# Patient Record
Sex: Male | Born: 1980 | Race: White | Hispanic: No | Smoking: Current some day smoker
Health system: Southern US, Community
[De-identification: ages and names within clinical notes are randomized; demographics above are authoritative.]

## PROBLEM LIST (undated history)

## (undated) DIAGNOSIS — F419 Anxiety disorder, unspecified: Secondary | ICD-10-CM

## (undated) DIAGNOSIS — F1010 Alcohol abuse, uncomplicated: Secondary | ICD-10-CM

## (undated) DIAGNOSIS — R569 Unspecified convulsions: Secondary | ICD-10-CM

## (undated) DIAGNOSIS — F32A Depression, unspecified: Secondary | ICD-10-CM

## (undated) DIAGNOSIS — F4310 Post-traumatic stress disorder, unspecified: Secondary | ICD-10-CM

## (undated) DIAGNOSIS — F329 Major depressive disorder, single episode, unspecified: Secondary | ICD-10-CM

## (undated) HISTORY — PX: WISDOM TOOTH EXTRACTION: SHX21

---

## 2014-01-06 ENCOUNTER — Encounter (HOSPITAL_COMMUNITY): Payer: Self-pay | Admitting: Emergency Medicine

## 2014-01-06 ENCOUNTER — Emergency Department (HOSPITAL_COMMUNITY)
Admission: EM | Admit: 2014-01-06 | Discharge: 2014-01-08 | Disposition: A | Discharge status: Other Acute Inpatient Hospital | Payer: Self-pay | Attending: Emergency Medicine | Admitting: Emergency Medicine

## 2014-01-06 DIAGNOSIS — F1010 Alcohol abuse, uncomplicated: Secondary | ICD-10-CM | POA: Insufficient documentation

## 2014-01-06 DIAGNOSIS — Z720 Tobacco use: Secondary | ICD-10-CM | POA: Insufficient documentation

## 2014-01-06 DIAGNOSIS — F1310 Sedative, hypnotic or anxiolytic abuse, uncomplicated: Secondary | ICD-10-CM | POA: Insufficient documentation

## 2014-01-06 DIAGNOSIS — R000 Tachycardia, unspecified: Secondary | ICD-10-CM | POA: Insufficient documentation

## 2014-01-06 DIAGNOSIS — F10239 Alcohol dependence with withdrawal, unspecified: Secondary | ICD-10-CM | POA: Diagnosis present

## 2014-01-06 LAB — RAPID URINE DRUG SCREEN, HOSP PERFORMED
AMPHETAMINES: NOT DETECTED
Barbiturates: NOT DETECTED
Benzodiazepines: POSITIVE — AB
COCAINE: NOT DETECTED
Opiates: NOT DETECTED
Tetrahydrocannabinol: NOT DETECTED

## 2014-01-06 LAB — CBC
HEMATOCRIT: 46.7 % (ref 39.0–52.0)
HEMOGLOBIN: 16.4 g/dL (ref 13.0–17.0)
MCH: 34.8 pg — ABNORMAL HIGH (ref 26.0–34.0)
MCHC: 35.1 g/dL (ref 30.0–36.0)
MCV: 99.2 fL (ref 78.0–100.0)
Platelets: 349 10*3/uL (ref 150–400)
RBC: 4.71 MIL/uL (ref 4.22–5.81)
RDW: 14.5 % (ref 11.5–15.5)
WBC: 15.3 10*3/uL — ABNORMAL HIGH (ref 4.0–10.5)

## 2014-01-06 LAB — COMPREHENSIVE METABOLIC PANEL
ALBUMIN: 4.7 g/dL (ref 3.5–5.2)
ALK PHOS: 44 U/L (ref 39–117)
ALT: 28 U/L (ref 0–53)
AST: 42 U/L — ABNORMAL HIGH (ref 0–37)
Anion gap: 17 — ABNORMAL HIGH (ref 5–15)
BILIRUBIN TOTAL: 0.6 mg/dL (ref 0.3–1.2)
BUN: 13 mg/dL (ref 6–23)
CO2: 24 mmol/L (ref 19–32)
Calcium: 8.6 mg/dL (ref 8.4–10.5)
Chloride: 101 mEq/L (ref 96–112)
Creatinine, Ser: 0.78 mg/dL (ref 0.50–1.35)
GFR calc Af Amer: >90 mL/min (ref >90)
GLUCOSE: 100 mg/dL — AB (ref 70–99)
Potassium: 3.5 mmol/L (ref 3.5–5.1)
SODIUM: 142 mmol/L (ref 135–145)
Total Protein: 7.7 g/dL (ref 6.0–8.3)

## 2014-01-06 LAB — ACETAMINOPHEN LEVEL

## 2014-01-06 LAB — ETHANOL: ALCOHOL ETHYL (B): 263 mg/dL — AB (ref 0–9)

## 2014-01-06 LAB — SALICYLATE LEVEL: Salicylate Lvl: <4 mg/dL (ref 2.8–20.0)

## 2014-01-06 MED ORDER — VITAMIN B-1 100 MG PO TABS
100.0000 mg | ORAL_TABLET | Freq: Every day | ORAL | Status: DC
  Administered 2014-01-06 – 2014-01-08 (×3): 100 mg via ORAL
  Filled 2014-01-06 (×3): qty 1

## 2014-01-06 MED ORDER — LORAZEPAM 1 MG PO TABS
0.0000 mg | ORAL_TABLET | Freq: Four times a day (QID) | ORAL | Status: DC
  Administered 2014-01-07: 1 mg via ORAL
  Administered 2014-01-07 – 2014-01-08 (×4): 2 mg via ORAL
  Filled 2014-01-06: qty 1
  Filled 2014-01-06: qty 2
  Filled 2014-01-06: qty 1
  Filled 2014-01-06 (×3): qty 2

## 2014-01-06 MED ORDER — THIAMINE HCL 100 MG/ML IJ SOLN: 100.0000 mg | Freq: Every day | INTRAMUSCULAR | Status: DC

## 2014-01-06 MED ORDER — LORAZEPAM 1 MG PO TABS
0.0000 mg | ORAL_TABLET | Freq: Two times a day (BID) | ORAL | Status: DC
  Administered 2014-01-07: 2 mg via ORAL
  Filled 2014-01-06: qty 2

## 2014-01-06 NOTE — ED Provider Notes (Signed)
CSN: [id number]     Arrival date & time 01/06/14  2121 History   First MD Initiated Contact with Patient 01/06/14 2140     Chief Complaint  Patient presents with  . Detox      ) HPI Patient presents requesting help with alcohol detox.  His last drink was denied.  He drinks a fifth of liquor a day.  He is a English as a second language teacher.  His last time through alcohol detox was one year ago.  At that time he stayed sober for several months.  His been back drinking again.  He states he uses alcohol to self medicate his depression.  He's never addressed his depression with the [facility] or any other physicians.  Reports occasional marijuana use.  Denies cocaine or other drugs of abuse.  No homicidal or suicidal thoughts.  He has had alcohol withdrawal seizures before and he does not want that again and therefore he presents early in his detox process.   History reviewed. No pertinent past medical history. History reviewed. No pertinent past surgical history. History reviewed. No pertinent family history. History  Substance Use Topics  . Smoking status: Current Every Day Smoker -- 1.00 packs/day    Types: Cigarettes  . Smokeless tobacco: Not on file  . Alcohol Use: Yes     Comment: 1/5 to 1/2 gallon of liquor    Review of Systems  All other systems reviewed and are negative.     Allergies  Review of patient's allergies indicates no known allergies.  Home Medications   Prior to Admission medications   Not on File   BP 144/82 mmHg  Pulse 115  Temp(Src) 98.3 F (36.8 C) (Oral)  Resp 18  Ht 6\' 2"  (1.88 m)  Wt 220 lb (99.791 kg)  BMI 28.23 kg/m2  SpO2 96% Physical Exam  Constitutional: He is oriented to person, place, and time. He appears well-developed and well-nourished.  HENT:  Head: Normocephalic and atraumatic.  Eyes: EOM are normal.  Neck: Normal range of motion.  Cardiovascular: Regular rhythm, normal heart sounds and intact distal pulses.   Tachycardia  Pulmonary/Chest: Effort  normal and breath sounds normal. No respiratory distress.  Abdominal: Soft. He exhibits no distension. There is no tenderness.  Musculoskeletal: Normal range of motion.  Neurological: He is alert and oriented to person, place, and time.  Skin: Skin is warm and dry.  Psychiatric: He has a normal mood and affect. Judgment normal.  No homicidal or suicidal ideation  Nursing note and vitals reviewed.   ED Course  Procedures (including critical care time) Labs Review Labs Reviewed  ACETAMINOPHEN LEVEL  CBC  COMPREHENSIVE METABOLIC PANEL  ETHANOL  SALICYLATE LEVEL  URINE RAPID DRUG SCREEN (HOSP PERFORMED)    Imaging Review No results found.   EKG Interpretation None      MDM   Final diagnoses:  None    Medically cleared at this time.  TTS to evaluate for placement for alcohol detox.  Patient placed on alcohol for all protocol.    Hoy Morn, MD 01/06/14 2201

## 2014-01-06 NOTE — ED Notes (Signed)
Patient reports drinking heavily and says "I'm going to have withdrawal symptoms. I'm looking for something as far as depression and anxiety medications." Is not currently taking any medications. Has had seizures in the past when "coming down from drinking alcohol." Reports drinking anywhere from 1/5 to 1/2 gallon of liquor per day. Denies illicit drug use. 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 Denies SI/HI.

## 2014-01-06 NOTE — ED Notes (Signed)
Received report from Dr. Venora Maples.

## 2014-01-06 NOTE — BH Assessment (Addendum)
Assessment Note  Chad Morgan is an 34 y.o. male who presents voluntarily to [facility] Emergency Department with the chief complaint of depression and detox. Patient was observed to be alert and oriented x4 during the assessment. Patient reported that prior to his ED visit he got into an argument with his girlfriend whom he was residing with at the time. Patient did not provide specifics to this argument but did state that he has suffered from depression and anxiety since 2010 without seeking treatment. Patient reports that he drinks a fifth to a half gallon of alcohol daily to cope with his depression and now desires treatment at this time. Patient endorses depressive symptoms that include: insomnia, social isolation, feelings of guilt, and feelings of hopelessness and worthlessness. Patient stated that he was in the [employer] from 2002 until 2010. Patient did report that when he attempts to stop using alcohol he experiences withdrawal symptoms that consist of change in blood pressure, sweats, and tremors. Patient reported that he also had a seizure in 2014 when he attempted to stop drinking. Patient reported that he has no familial support at this time and denies any previous outpatient or inpatient treatment for depression and substance abuse. Patient denies SI/HI/AVH at this time.   Axis I: Major Depression, Recurrent severe and Alcohol Use Disorder Axis II: Deferred Axis III: History reviewed. No pertinent past medical history. Axis IV: housing problems, other psychosocial or environmental problems, problems related to social environment and problems with primary support group Axis V: 41-50 serious symptoms  Past Medical History: History reviewed. No pertinent past medical history.  History reviewed. No pertinent past surgical history.  Family History: History reviewed. No pertinent family history.  Social History:  reports that he has been smoking Cigarettes.  He has been smoking about  1.00 pack per day. He does not have any smokeless tobacco history on file. He reports that he drinks alcohol. He reports that he does not use illicit drugs.  Additional Social History:  Alcohol / Drug Use History of alcohol / drug use?: Yes Substance #1 Name of Substance 1: ETOH 1 - Age of First Use: 17 1 - Amount (size/oz): a fifth to a half gallon 1 - Frequency: daily 1 - Duration: years 1 - Last Use / Amount: 01/06/14-amount unspecified  CIWA: CIWA-Ar BP: 144/82 mmHg Pulse Rate: 115 COWS:    Allergies: No Known Allergies  Home Medications:  (Not in a hospital admission)  OB/GYN Status:  No LMP for male patient.  General Assessment Data Location of Assessment: [facility] ED Is this a Tele or Face-to-Face Assessment?: Face-to-Face Is this an Initial Assessment or a Re-assessment for this encounter?: Initial Assessment Living Arrangements: Other (Comment) (Homeless (was living with girlfriend but got into an argumen) Can pt return to current living arrangement?: No Admission Status: Voluntary Is patient capable of signing voluntary admission?: Yes Transfer from: [facility] Referral Source: Self/Family/Friend     [place] Living Arrangements: Other (Comment) (Homeless (was living with girlfriend but got into an argumen) Name of Psychiatrist: None Name of Therapist: None  Education Status Is patient currently in school?: No  Risk to self with the past 6 months Suicidal Ideation: No Suicidal Intent: No Is patient at risk for suicide?: No Suicidal Plan?: No Access to Means: No What has been your use of drugs/alcohol within the last 12 months?: ETOH Previous Attempts/Gestures: No How many times?: 0 Triggers for Past Attempts: None known Intentional Self Injurious Behavior: None Family  Suicide History: Unknown Recent stressful life event(s): Conflict (Comment) Persecutory voices/beliefs?: No Depression: Yes Depression Symptoms: Insomnia, Tearfulness,  Isolating, Loss of interest in usual pleasures, Feeling worthless/self pity, Feeling angry/irritable Substance abuse history and/or treatment for substance abuse?: Yes Suicide prevention information given to non-admitted patients: Not applicable  Risk to Others within the past 6 months Homicidal Ideation: No Thoughts of Harm to Others: No Current Homicidal Intent: No Current Homicidal Plan: No Access to Homicidal Means: No Identified Victim: None History of harm to others?: No Assessment of Violence: None Noted Violent Behavior Description: Pt is calm and cooperative Does patient have access to weapons?: No Criminal Charges Pending?: No Does patient have a court date: No  Psychosis Hallucinations: None noted Delusions: None noted  Mental Status Report Appear/Hygiene: Disheveled Eye Contact: Fair Motor Activity: Freedom of movement Speech: Logical/coherent Level of Consciousness: Alert Mood: Depressed Affect: Depressed Anxiety Level: None Thought Processes: Coherent, Relevant Judgement: Partial Orientation: Person, Place, Time, Situation Obsessive Compulsive Thoughts/Behaviors: None  Cognitive Functioning Concentration: Normal Memory: Recent Intact, Remote Intact IQ: Average Insight: Fair Impulse Control: Poor Appetite: Poor Weight Loss: 0 Weight Gain: 0 Sleep: Decreased Total Hours of Sleep: 4 Vegetative Symptoms: None  ADLScreening [facility] Assessment Services) Patient's cognitive ability adequate to safely complete daily activities?: Yes Patient able to express need for assistance with ADLs?: Yes Independently performs ADLs?: Yes (appropriate for developmental age)  Prior Inpatient Therapy Prior Inpatient Therapy: No  Prior Outpatient Therapy Prior Outpatient Therapy: No  ADL Screening (condition at time of admission) Patient's cognitive ability adequate to safely complete daily activities?: Yes Is the patient deaf or have difficulty hearing?: No Does the  patient have difficulty seeing, even when wearing glasses/contacts?: No Does the patient have difficulty concentrating, remembering, or making decisions?: No Patient able to express need for assistance with ADLs?: Yes Does the patient have difficulty dressing or bathing?: No Independently performs ADLs?: Yes (appropriate for developmental age) Does the patient have difficulty walking or climbing stairs?: No Weakness of Legs: None Weakness of Arms/Hands: None  Home Assistive Devices/Equipment Home Assistive Devices/Equipment: None  Therapy Consults (therapy consults require a physician order) PT Evaluation Needed: No OT Evalulation Needed: No SLP Evaluation Needed: No Abuse/Neglect Assessment (Assessment to be complete while patient is alone) Physical Abuse: Denies Verbal Abuse: Denies Sexual Abuse: Denies Exploitation of patient/patient's resources: Denies Self-Neglect: Denies Values / Beliefs Cultural Requests During Hospitalization: None Spiritual Requests During Hospitalization: None Consults Spiritual Care Consult Needed: No Social Work Consult Needed: No Regulatory affairs officer (For Healthcare) Does patient have an advance directive?: No    Additional Information 1:1 In Past 12 Months?: No CIRT Risk: No Elopement Risk: No Does patient have medical clearance?: Yes     Disposition:  Disposition Initial Assessment Completed for this Encounter: Yes  On Site Evaluation by:   Reviewed with Physician:    Boyce Medici C 01/06/2014 10:18 PM

## 2014-01-07 DIAGNOSIS — F10239 Alcohol dependence with withdrawal, unspecified: Secondary | ICD-10-CM

## 2014-01-07 DIAGNOSIS — F1010 Alcohol abuse, uncomplicated: Secondary | ICD-10-CM | POA: Insufficient documentation

## 2014-01-07 MED ORDER — NICOTINE 21 MG/24HR TD PT24
21.0000 mg | MEDICATED_PATCH | Freq: Every day | TRANSDERMAL | Status: DC
  Administered 2014-01-07 – 2014-01-08 (×2): 21 mg via TRANSDERMAL
  Filled 2014-01-07 (×2): qty 1

## 2014-01-07 MED ORDER — ONDANSETRON HCL 4 MG PO TABS
4.0000 mg | ORAL_TABLET | Freq: Three times a day (TID) | ORAL | Status: DC | PRN
Start: 2014-01-07 — End: 2014-01-08
  Administered 2014-01-07: 4 mg via ORAL
  Filled 2014-01-07: qty 1

## 2014-01-07 NOTE — ED Notes (Signed)
Pt asleep at this time, no s/s of distress noted

## 2014-01-07 NOTE — ED Notes (Signed)
He is awake, alert and in no distress.  He has just been dressed in scrubs and wanded; and are taking him to out [facility] Now.  He is very polite and cooperative.

## 2014-01-07 NOTE — ED Notes (Signed)
Change of scrubs given to patient due to diaphoresis. Pt with beads of sweat on hands and head.

## 2014-01-07 NOTE — Consult Note (Signed)
[place] [facility] Consult   Reason for Consult:  Alcohol detox Referring Physician:  EDP  Chad Morgan is an 34 y.o. male. Total Time spent with patient: 45 minutes  Assessment: AXIS I:  Alcohol dependence with withdrawal complication AXIS II:  Deferred AXIS III:  History reviewed. No pertinent past medical history. AXIS IV:  economic problems, housing problems, other psychosocial or environmental problems, problems related to social environment and problems with primary support group AXIS V:  51-60 moderate symptoms  Plan:  Supportive therapy provided about ongoing stressors.  Re-evaluate for observation unit tomorrow.  Subjective:   Chad Morgan is a 34 y.o. male patient needs to stabilize prior to transferring to [facility] observation unit.  HPI:  The patient was drinking a 1/5 to 1/2 gallon of liquor a day for the past few months.  His last detox was one year ago.  Chad Morgan reports withdrawal seizures from alcohol detox in the past.  Last night, he argued with his live-in girlfriend and they "split up last night."  Now, he has no where to live.  Denies suicidal/homicidal ideations, hallucinations, and drug abuse.  He wants to detox and return to [facility] and/or rehab to maintain sobriety. HPI Elements:   Location:  generalzied. Quality:  acute--recent relapse; chronic alcohol dependence. Severity:  moderate to severe. Timing:  constant. Duration:  few months. Context:  stressors.  Past Psychiatric History: History reviewed. No pertinent past medical history.  reports that he has been smoking Cigarettes.  He has been smoking about 1.00 pack per day. He does not have any smokeless tobacco history on file. He reports that he drinks alcohol. He reports that he does not use illicit drugs. History reviewed. No pertinent family history. Family History Substance Abuse: No Family Supports: No Living Arrangements: Other (Comment) (Homeless (was living with girlfriend but got into an  argumen) Can pt return to current living arrangement?: No Abuse/Neglect [facility]) Physical Abuse: Denies Verbal Abuse: Denies Sexual Abuse: Denies Allergies:  No Known Allergies  ACT Assessment Complete:  Yes:    Educational Status    Risk to Self: Risk to self with the past 6 months Suicidal Ideation: No Suicidal Intent: No Is patient at risk for suicide?: No Suicidal Plan?: No Access to Means: No What has been your use of drugs/alcohol within the last 12 months?: ETOH Previous Attempts/Gestures: No How many times?: 0 Triggers for Past Attempts: None known Intentional Self Injurious Behavior: None Family Suicide History: Unknown Recent stressful life event(s): Conflict (Comment) Persecutory voices/beliefs?: No Depression: Yes Depression Symptoms: Insomnia, Tearfulness, Isolating, Loss of interest in usual pleasures, Feeling worthless/self pity, Feeling angry/irritable Substance abuse history and/or treatment for substance abuse?: Yes Suicide prevention information given to non-admitted patients: Not applicable  Risk to Others: Risk to Others within the past 6 months Homicidal Ideation: No Thoughts of Harm to Others: No Current Homicidal Intent: No Current Homicidal Plan: No Access to Homicidal Means: No Identified Victim: None History of harm to others?: No Assessment of Violence: None Noted Violent Behavior Description: Pt is calm and cooperative Does patient have access to weapons?: No Criminal Charges Pending?: No Does patient have a court date: No  Abuse: Abuse/Neglect Assessment (Assessment to be complete while patient is alone) Physical Abuse: Denies Verbal Abuse: Denies Sexual Abuse: Denies Exploitation of patient/patient's resources: Denies Self-Neglect: Denies  Prior Inpatient Therapy: Prior Inpatient Therapy Prior Inpatient Therapy: No  Prior Outpatient Therapy: Prior Outpatient Therapy Prior Outpatient Therapy: No  Additional Information: Additional  Information 1:1  In Past 12 Months?: No CIRT Risk: No Elopement Risk: No Does patient have medical clearance?: Yes                  Objective: Blood pressure 102/43, pulse 90, temperature 97.9 F (36.6 C), temperature source Oral, resp. rate 16, height 6' 2" (1.88 m), weight 220 lb (99.791 kg), SpO2 97 %.Body mass index is 28.23 kg/(m^2). Results for orders placed or performed during the hospital encounter of 01/06/14 (from the past 72 hour(s))  Acetaminophen level     Status: Abnormal   Collection Time: 01/06/14  9:46 PM  Result Value Ref Range   Acetaminophen (Tylenol), Serum <10.0 (L) 10 - 30 ug/mL    Comment:        THERAPEUTIC CONCENTRATIONS VARY SIGNIFICANTLY. A RANGE OF 10-30 ug/mL MAY BE AN EFFECTIVE CONCENTRATION FOR MANY PATIENTS. HOWEVER, SOME ARE BEST TREATED AT CONCENTRATIONS OUTSIDE THIS RANGE. ACETAMINOPHEN CONCENTRATIONS >150 ug/mL AT 4 HOURS AFTER INGESTION AND >50 ug/mL AT 12 HOURS AFTER INGESTION ARE OFTEN ASSOCIATED WITH TOXIC REACTIONS.   CBC     Status: Abnormal   Collection Time: 01/06/14  9:46 PM  Result Value Ref Range   WBC 15.3 (H) 4.0 - 10.5 K/uL   RBC 4.71 4.22 - 5.81 MIL/uL   Hemoglobin 16.4 13.0 - 17.0 g/dL   HCT 46.7 39.0 - 52.0 %   MCV 99.2 78.0 - 100.0 fL   MCH 34.8 (H) 26.0 - 34.0 pg   MCHC 35.1 30.0 - 36.0 g/dL   RDW 14.5 11.5 - 15.5 %   Platelets 349 150 - 400 K/uL  Comprehensive metabolic panel     Status: Abnormal   Collection Time: 01/06/14  9:46 PM  Result Value Ref Range   Sodium 142 135 - 145 mmol/L    Comment: Please note change in reference range.   Potassium 3.5 3.5 - 5.1 mmol/L    Comment: Please note change in reference range.   Chloride 101 96 - 112 mEq/L   CO2 24 19 - 32 mmol/L   Glucose, Bld 100 (H) 70 - 99 mg/dL   BUN 13 6 - 23 mg/dL   Creatinine, Ser 0.78 0.50 - 1.35 mg/dL   Calcium 8.6 8.4 - 10.5 mg/dL   Total Protein 7.7 6.0 - 8.3 g/dL   Albumin 4.7 3.5 - 5.2 g/dL   AST 42 (H) 0 - 37 U/L    ALT 28 0 - 53 U/L   Alkaline Phosphatase 44 39 - 117 U/L   Total Bilirubin 0.6 0.3 - 1.2 mg/dL   GFR calc non Af Amer >90 >90 mL/min   GFR calc Af Amer >90 >90 mL/min    Comment: (NOTE) The eGFR has been calculated using the CKD EPI equation. This calculation has not been validated in all clinical situations. eGFR's persistently <90 mL/min signify possible Chronic Kidney Disease.    Anion gap 17 (H) 5 - 15  Ethanol (ETOH)     Status: Abnormal   Collection Time: 01/06/14  9:46 PM  Result Value Ref Range   Alcohol, Ethyl (B) 263 (H) 0 - 9 mg/dL    Comment:        LOWEST DETECTABLE LIMIT FOR SERUM ALCOHOL IS 11 mg/dL FOR MEDICAL PURPOSES ONLY   Salicylate level     Status: None   Collection Time: 01/06/14  9:46 PM  Result Value Ref Range   Salicylate Lvl <4.4 2.8 - 20.0 mg/dL  Urine Drug Screen     Status:  Abnormal   Collection Time: 01/06/14 10:10 PM  Result Value Ref Range   Opiates NONE DETECTED NONE DETECTED   Cocaine NONE DETECTED NONE DETECTED   Benzodiazepines POSITIVE (A) NONE DETECTED   Amphetamines NONE DETECTED NONE DETECTED   Tetrahydrocannabinol NONE DETECTED NONE DETECTED   Barbiturates NONE DETECTED NONE DETECTED    Comment:        DRUG SCREEN FOR MEDICAL PURPOSES ONLY.  IF CONFIRMATION IS NEEDED FOR ANY PURPOSE, NOTIFY LAB WITHIN 5 DAYS.        LOWEST DETECTABLE LIMITS FOR URINE DRUG SCREEN Drug Class       Cutoff (ng/mL) Amphetamine      1000 Barbiturate      200 Benzodiazepine   419 Tricyclics       379 Opiates          300 Cocaine          300 THC              50    Labs are reviewed and are pertinent for no medical issues.  Current Facility-Administered Medications  Medication Dose Route Frequency Provider Last Rate Last Dose  . LORazepam (ATIVAN) tablet 0-4 mg  0-4 mg Oral 4 times per day Hoy Morn, MD   2 mg at 01/07/14 1342   Followed by  . [START ON 01/08/2014] LORazepam (ATIVAN) tablet 0-4 mg  0-4 mg Oral Q12H Hoy Morn, MD       . thiamine (VITAMIN B-1) tablet 100 mg  100 mg Oral Daily Hoy Morn, MD   100 mg at 01/07/14 1042   Or  . thiamine (B-1) injection 100 mg  100 mg Intravenous Daily Hoy Morn, MD       Current Outpatient Prescriptions  Medication Sig Dispense Refill  . acetaminophen (TYLENOL) 325 MG tablet Take 650 mg by mouth every 6 (six) hours as needed for moderate pain.      Psychiatric Specialty Exam:     Blood pressure 102/43, pulse 90, temperature 97.9 F (36.6 C), temperature source Oral, resp. rate 16, height 6' 2" (1.88 m), weight 220 lb (99.791 kg), SpO2 97 %.Body mass index is 28.23 kg/(m^2).  General Appearance: Disheveled  Eye Sport and exercise psychologist::  Fair  Speech:  Normal Rate  Volume:  Decreased  Mood:  Anxious  Affect:  Congruent  Thought Process:  Coherent  Orientation:  Full (Time, Place, and Person)  Thought Content:  WDL  Suicidal Thoughts:  No  Homicidal Thoughts:  No  Memory:  Immediate;   Fair Recent;   Fair Remote;   Fair  Judgement:  Fair  Insight:  Fair  Psychomotor Activity:  Decreased  Concentration:  Fair  Recall:  [employer] [place]  Language: Fair  Akathisia:  No  Handed:  Right  AIMS (if indicated):     Assets:  Communication Skills Desire for Improvement Leisure Time Physical Health Resilience  Sleep:      Musculoskeletal: Strength & Muscle Tone: within normal limits Gait & Station: normal Patient leans: N/A  Treatment Plan Summary: Daily contact with patient to assess and evaluate symptoms and progress in treatment Medication management; Ativan alcohol detox protocol in place; monitor for withdrawal seizures, once stable--transfer to [facility] observation unit (most likely tomorrow)  Waylan Boga, PMH-NP 01/07/2014 3:05 PM  I have personally seen the patient and agreed with the findings and involved in the treatment plan. Merian Capron, MD

## 2014-01-07 NOTE — ED Notes (Signed)
Pt up at desk complaining that he is having tremors. States that he needs something to calm his nerves. Ativan given 1 mg.

## 2014-01-07 NOTE — ED Notes (Signed)
Security called to wand pt and belongings

## 2014-01-07 NOTE — ED Notes (Signed)
Patient is resting comfortably. 

## 2014-01-08 ENCOUNTER — Inpatient Hospital Stay (HOSPITAL_COMMUNITY)
Admission: AD | Admit: 2014-01-08 | Discharge: 2014-01-10 | DRG: 897 | Disposition: A | Discharge status: Home / Self Care | Payer: Federal, State, Local not specified - Other | Source: Intra-hospital | Attending: Psychiatry | Admitting: Psychiatry

## 2014-01-08 ENCOUNTER — Encounter (HOSPITAL_COMMUNITY): Payer: Self-pay | Admitting: *Deleted

## 2014-01-08 DIAGNOSIS — F17210 Nicotine dependence, cigarettes, uncomplicated: Secondary | ICD-10-CM | POA: Diagnosis present

## 2014-01-08 DIAGNOSIS — Z590 Homelessness: Secondary | ICD-10-CM | POA: Diagnosis not present

## 2014-01-08 DIAGNOSIS — F321 Major depressive disorder, single episode, moderate: Secondary | ICD-10-CM | POA: Diagnosis present

## 2014-01-08 DIAGNOSIS — Z23 Encounter for immunization: Secondary | ICD-10-CM | POA: Diagnosis not present

## 2014-01-08 DIAGNOSIS — G4700 Insomnia, unspecified: Secondary | ICD-10-CM | POA: Diagnosis present

## 2014-01-08 DIAGNOSIS — F331 Major depressive disorder, recurrent, moderate: Secondary | ICD-10-CM | POA: Diagnosis present

## 2014-01-08 DIAGNOSIS — F410 Panic disorder [episodic paroxysmal anxiety] without agoraphobia: Secondary | ICD-10-CM | POA: Diagnosis present

## 2014-01-08 DIAGNOSIS — F411 Generalized anxiety disorder: Secondary | ICD-10-CM | POA: Diagnosis present

## 2014-01-08 DIAGNOSIS — Y908 Blood alcohol level of 240 mg/100 ml or more: Secondary | ICD-10-CM | POA: Diagnosis present

## 2014-01-08 DIAGNOSIS — F4010 Social phobia, unspecified: Secondary | ICD-10-CM | POA: Diagnosis present

## 2014-01-08 DIAGNOSIS — F10230 Alcohol dependence with withdrawal, uncomplicated: Secondary | ICD-10-CM | POA: Insufficient documentation

## 2014-01-08 DIAGNOSIS — F10239 Alcohol dependence with withdrawal, unspecified: Secondary | ICD-10-CM | POA: Diagnosis present

## 2014-01-08 DIAGNOSIS — F1020 Alcohol dependence, uncomplicated: Secondary | ICD-10-CM | POA: Diagnosis present

## 2014-01-08 HISTORY — DX: Major depressive disorder, single episode, unspecified: F32.9

## 2014-01-08 HISTORY — DX: Depression, unspecified: F32.A

## 2014-01-08 HISTORY — DX: Unspecified convulsions: R56.9

## 2014-01-08 HISTORY — DX: Anxiety disorder, unspecified: F41.9

## 2014-01-08 MED ORDER — LORAZEPAM 1 MG PO TABS
0.0000 mg | ORAL_TABLET | Freq: Two times a day (BID) | ORAL | Status: DC
  Administered 2014-01-08: 2 mg via ORAL
  Administered 2014-01-09: 1 mg via ORAL
  Filled 2014-01-08: qty 2
  Filled 2014-01-08: qty 1

## 2014-01-08 MED ORDER — TRAZODONE HCL 100 MG PO TABS: 100.0000 mg | ORAL_TABLET | Freq: Every evening | ORAL | Status: DC | PRN

## 2014-01-08 MED ORDER — TRAZODONE HCL 100 MG PO TABS
100.0000 mg | ORAL_TABLET | Freq: Every evening | ORAL | Status: DC | PRN
  Administered 2014-01-08 – 2014-01-09 (×2): 100 mg via ORAL
  Filled 2014-01-08: qty 14
  Filled 2014-01-08 (×2): qty 1

## 2014-01-08 MED ORDER — NICOTINE 21 MG/24HR TD PT24
21.0000 mg | MEDICATED_PATCH | Freq: Every day | TRANSDERMAL | Status: DC
  Administered 2014-01-09 – 2014-01-10 (×2): 21 mg via TRANSDERMAL
  Filled 2014-01-08 (×5): qty 1
  Filled 2014-01-08: qty 14

## 2014-01-08 MED ORDER — INFLUENZA VAC SPLIT QUAD 0.5 ML IM SUSY
0.5000 mL | PREFILLED_SYRINGE | INTRAMUSCULAR | Status: DC
  Filled 2014-01-08: qty 0.5

## 2014-01-08 MED ORDER — CITALOPRAM HYDROBROMIDE 20 MG PO TABS
20.0000 mg | ORAL_TABLET | Freq: Every day | ORAL | Status: DC
  Administered 2014-01-08: 20 mg via ORAL
  Filled 2014-01-08 (×2): qty 1

## 2014-01-08 MED ORDER — ALUM & MAG HYDROXIDE-SIMETH 200-200-20 MG/5ML PO SUSP: 30.0000 mL | ORAL | Status: DC | PRN

## 2014-01-08 MED ORDER — THIAMINE HCL 100 MG/ML IJ SOLN: 100.0000 mg | Freq: Every day | INTRAMUSCULAR | Status: DC

## 2014-01-08 MED ORDER — ONDANSETRON HCL 4 MG PO TABS
4.0000 mg | ORAL_TABLET | Freq: Three times a day (TID) | ORAL | Status: DC | PRN
  Filled 2014-01-08 (×2): qty 1

## 2014-01-08 MED ORDER — PNEUMOCOCCAL VAC POLYVALENT 25 MCG/0.5ML IJ INJ
0.5000 mL | INJECTION | INTRAMUSCULAR | Status: AC
  Administered 2014-01-09: 0.5 mL via INTRAMUSCULAR

## 2014-01-08 MED ORDER — CITALOPRAM HYDROBROMIDE 20 MG PO TABS
20.0000 mg | ORAL_TABLET | Freq: Every day | ORAL | Status: DC
  Administered 2014-01-09 – 2014-01-10 (×2): 20 mg via ORAL
  Filled 2014-01-08: qty 14
  Filled 2014-01-08 (×4): qty 1

## 2014-01-08 MED ORDER — ACETAMINOPHEN 325 MG PO TABS: 650.0000 mg | ORAL_TABLET | Freq: Four times a day (QID) | ORAL | Status: DC | PRN

## 2014-01-08 MED ORDER — MAGNESIUM HYDROXIDE 400 MG/5ML PO SUSP: 30.0000 mL | Freq: Every day | ORAL | Status: DC | PRN

## 2014-01-08 MED ORDER — VITAMIN B-1 100 MG PO TABS
100.0000 mg | ORAL_TABLET | Freq: Every day | ORAL | Status: DC
  Administered 2014-01-09: 100 mg via ORAL
  Filled 2014-01-08 (×3): qty 1

## 2014-01-08 NOTE — Consult Note (Addendum)
[place] [place] [facility] Consult   Reason for Consult:  Alcohol detox Referring Physician:  EDP  Chad Morgan is an 34 y.o. male. Total Time spent with patient: 30 minutes  Assessment: AXIS I:  Alcohol dependence with withdrawal complication AXIS II:  Deferred AXIS III:  History reviewed. No pertinent past medical history. AXIS IV:  economic problems, housing problems, other psychosocial or environmental problems, problems related to social environment and problems with primary support group AXIS V:  51-60 moderate symptoms  Plan:  Supportive therapy provided about ongoing stressors.  Admit to inpatient unit due to withdrawal seizures in the past.  Subjective:   Chad Morgan is a 34 y.o. male patient will be admitted to inpatient hospitalization for stabilization.  HPI:  On admission:  The patient was drinking a 1/5 to 1/2 gallon of liquor a day for the past few months.  His last detox was one year ago.  Chad Morgan reports withdrawal seizures from alcohol detox in the past.  Last night, he argued with his live-in girlfriend and they "split up last night."  Now, he has no where to live.  Denies suicidal/homicidal ideations, hallucinations, and drug abuse.  He wants to detox and return to [facility] and/or rehab to maintain sobriety.  Today:  The patient's alcohol detox continues without seizure activity.  He will be admitted to an inpatient unit at this time to continue his detox. HPI Elements:   Location:  generalzied. Quality:  acute--recent relapse; chronic alcohol dependence. Severity:  moderate to severe. Timing:  constant. Duration:  few months. Context:  stressors.  Past Psychiatric History: History reviewed. No pertinent past medical history.  reports that he has been smoking Cigarettes.  He has been smoking about 1.00 pack per day. He does not have any smokeless tobacco history on file. He reports that he drinks alcohol. He reports that he does not use illicit drugs. History  reviewed. No pertinent family history. Family History Substance Abuse: No Family Supports: No Living Arrangements: Other (Comment) (Homeless (was living with girlfriend but got into an argumen) Can pt return to current living arrangement?: No Abuse/Neglect [facility]) Physical Abuse: Denies Verbal Abuse: Denies Sexual Abuse: Denies Allergies:  No Known Allergies  ACT Assessment Complete:  Yes:    Educational Status    Risk to Self: Risk to self with the past 6 months Suicidal Ideation: No Suicidal Intent: No Is patient at risk for suicide?: No Suicidal Plan?: No Access to Means: No What has been your use of drugs/alcohol within the last 12 months?: ETOH Previous Attempts/Gestures: No How many times?: 0 Triggers for Past Attempts: None known Intentional Self Injurious Behavior: None Family Suicide History: Unknown Recent stressful life event(s): Conflict (Comment) Persecutory voices/beliefs?: No Depression: Yes Depression Symptoms: Insomnia, Tearfulness, Isolating, Loss of interest in usual pleasures, Feeling worthless/self pity, Feeling angry/irritable Substance abuse history and/or treatment for substance abuse?: Yes Suicide prevention information given to non-admitted patients: Not applicable  Risk to Others: Risk to Others within the past 6 months Homicidal Ideation: No Thoughts of Harm to Others: No Current Homicidal Intent: No Current Homicidal Plan: No Access to Homicidal Means: No Identified Victim: None History of harm to others?: No Assessment of Violence: None Noted Violent Behavior Description: Pt is calm and cooperative Does patient have access to weapons?: No Criminal Charges Pending?: No Does patient have a court date: No  Abuse: Abuse/Neglect Assessment (Assessment to be complete while patient is alone) Physical Abuse: Denies Verbal Abuse: Denies Sexual Abuse: Denies Exploitation of  patient/patient's resources: Denies Self-Neglect: Denies  Prior Inpatient  Therapy: Prior Inpatient Therapy Prior Inpatient Therapy: No  Prior Outpatient Therapy: Prior Outpatient Therapy Prior Outpatient Therapy: No  Additional Information: Additional Information 1:1 In Past 12 Months?: No CIRT Risk: No Elopement Risk: No Does patient have medical clearance?: Yes                  Objective: Blood pressure 121/78, pulse 97, temperature 98.1 F (36.7 C), temperature source Oral, resp. rate 16, height _0  (1.88 m), weight 220 lb (99.791 kg), SpO2 95 %.Body mass index is 28.23 kg/(m^2). Results for orders placed or performed during the hospital encounter of 01/06/14 (from the past 72 hour(s))  Acetaminophen level     Status: Abnormal   Collection Time: 01/06/14  9:46 PM  Result Value Ref Range   Acetaminophen (Tylenol), Serum <10.0 (L) 10 - 30 ug/mL    Comment:        THERAPEUTIC CONCENTRATIONS VARY SIGNIFICANTLY. A RANGE OF 10-30 ug/mL MAY BE AN EFFECTIVE CONCENTRATION FOR MANY PATIENTS. HOWEVER, SOME ARE BEST TREATED AT CONCENTRATIONS OUTSIDE THIS RANGE. ACETAMINOPHEN CONCENTRATIONS >150 ug/mL AT 4 HOURS AFTER INGESTION AND >50 ug/mL AT 12 HOURS AFTER INGESTION ARE OFTEN ASSOCIATED WITH TOXIC REACTIONS.   CBC     Status: Abnormal   Collection Time: 01/06/14  9:46 PM  Result Value Ref Range   WBC 15.3 (H) 4.0 - 10.5 K/uL   RBC 4.71 4.22 - 5.81 MIL/uL   Hemoglobin 16.4 13.0 - 17.0 g/dL   HCT 46.7 39.0 - 52.0 %   MCV 99.2 78.0 - 100.0 fL   MCH 34.8 (H) 26.0 - 34.0 pg   MCHC 35.1 30.0 - 36.0 g/dL   RDW 14.5 11.5 - 15.5 %   Platelets 349 150 - 400 K/uL  Comprehensive metabolic panel     Status: Abnormal   Collection Time: 01/06/14  9:46 PM  Result Value Ref Range   Sodium 142 135 - 145 mmol/L    Comment: Please note change in reference range.   Potassium 3.5 3.5 - 5.1 mmol/L    Comment: Please note change in reference range.   Chloride 101 96 - 112 mEq/L   CO2 24 19 - 32 mmol/L   Glucose, Bld 100 (H) 70 - 99 mg/dL   BUN 13 6  - 23 mg/dL   Creatinine, Ser 0.78 0.50 - 1.35 mg/dL   Calcium 8.6 8.4 - 10.5 mg/dL   Total Protein 7.7 6.0 - 8.3 g/dL   Albumin 4.7 3.5 - 5.2 g/dL   AST 42 (H) 0 - 37 U/L   ALT 28 0 - 53 U/L   Alkaline Phosphatase 44 39 - 117 U/L   Total Bilirubin 0.6 0.3 - 1.2 mg/dL   GFR calc non Af Amer >90 >90 mL/min   GFR calc Af Amer >90 >90 mL/min    Comment: (NOTE) The eGFR has been calculated using the CKD EPI equation. This calculation has not been validated in all clinical situations. eGFR's persistently <90 mL/min signify possible Chronic Kidney Disease.    Anion gap 17 (H) 5 - 15  Ethanol (ETOH)     Status: Abnormal   Collection Time: 01/06/14  9:46 PM  Result Value Ref Range   Alcohol, Ethyl (B) 263 (H) 0 - 9 mg/dL    Comment:        LOWEST DETECTABLE LIMIT FOR SERUM ALCOHOL IS 11 mg/dL FOR MEDICAL PURPOSES ONLY   Salicylate level  Status: None   Collection Time: 01/06/14  9:46 PM  Result Value Ref Range   Salicylate Lvl <3.8 2.8 - 20.0 mg/dL  Urine Drug Screen     Status: Abnormal   Collection Time: 01/06/14 10:10 PM  Result Value Ref Range   Opiates NONE DETECTED NONE DETECTED   Cocaine NONE DETECTED NONE DETECTED   Benzodiazepines POSITIVE (A) NONE DETECTED   Amphetamines NONE DETECTED NONE DETECTED   Tetrahydrocannabinol NONE DETECTED NONE DETECTED   Barbiturates NONE DETECTED NONE DETECTED    Comment:        DRUG SCREEN FOR MEDICAL PURPOSES ONLY.  IF CONFIRMATION IS NEEDED FOR ANY PURPOSE, NOTIFY LAB WITHIN 5 DAYS.        LOWEST DETECTABLE LIMITS FOR URINE DRUG SCREEN Drug Class       Cutoff (ng/mL) Amphetamine      1000 Barbiturate      200 Benzodiazepine   453 Tricyclics       646 Opiates          300 Cocaine          300 THC              50    Labs are reviewed and are pertinent for no medical issues.  Current Facility-Administered Medications  Medication Dose Route Frequency Provider Last Rate Last Dose  . citalopram (CELEXA) tablet 20 mg  20 mg  Oral Daily Daniel Ritthaler      . LORazepam (ATIVAN) tablet 0-4 mg  0-4 mg Oral 4 times per day Hoy Morn, MD   2 mg at 01/08/14 1125   Followed by  . LORazepam (ATIVAN) tablet 0-4 mg  0-4 mg Oral Q12H Hoy Morn, MD   2 mg at 01/07/14 1857  . nicotine (NICODERM CQ - dosed in mg/24 hours) patch 21 mg  21 mg Transdermal Daily Quintella Reichert, MD   21 mg at 01/08/14 0945  . ondansetron (ZOFRAN) tablet 4 mg  4 mg Oral Q8H PRN Quintella Reichert, MD   4 mg at 01/07/14 1857  . thiamine (VITAMIN B-1) tablet 100 mg  100 mg Oral Daily Hoy Morn, MD   100 mg at 01/08/14 8032   Or  . thiamine (B-1) injection 100 mg  100 mg Intravenous Daily Hoy Morn, MD      . traZODone (DESYREL) tablet 100 mg  100 mg Oral QHS PRN Sabella Traore       Current Outpatient Prescriptions  Medication Sig Dispense Refill  . acetaminophen (TYLENOL) 325 MG tablet Take 650 mg by mouth every 6 (six) hours as needed for moderate pain.      Psychiatric Specialty Exam:     Blood pressure 121/78, pulse 97, temperature 98.1 F (36.7 C), temperature source Oral, resp. rate 16, height _0  (1.88 m), weight 220 lb (99.791 kg), SpO2 95 %.Body mass index is 28.23 kg/(m^2).  General Appearance: Disheveled  Eye Sport and exercise psychologist::  Fair  Speech:  Normal Rate  Volume:  Decreased  Mood:  Anxious  Affect:  Congruent  Thought Process:  Coherent  Orientation:  Full (Time, Place, and Person)  Thought Content:  WDL  Suicidal Thoughts:  No  Homicidal Thoughts:  No  Memory:  Immediate;   Fair Recent;   Fair Remote;   Fair  Judgement:  Fair  Insight:  Fair  Psychomotor Activity:  Decreased  Concentration:  Fair  Recall:  [employer] of Knowledge:Fair  Language: Fair  Akathisia:  No  Handed:  Right  AIMS (if indicated):     Assets:  Communication Skills Desire for Improvement Leisure Time Physical Health Resilience  Sleep:      Musculoskeletal: Strength & Muscle Tone: within normal limits Gait & Station:  normal Patient leans: N/A  Treatment Plan Summary: Daily contact with patient to assess and evaluate symptoms and progress in treatment Medication management; Ativan alcohol detox protocol in place; monitor for withdrawal seizures, once stable--transfer to inpatient hospitalization  Waylan Boga, PMH-NP 01/08/2014 1:22 PM  I have personally seen the patient and agreed with the findings and involved in the treatment plan.  Patient seen, evaluated and I agree with notes by Nurse Practitioner. Corena Pilgrim, MD

## 2014-01-08 NOTE — Tx Team (Signed)
Initial Interdisciplinary Treatment Plan   PATIENT STRESSORS: Financial difficulties Marital or family conflict Substance abuse   PATIENT STRENGTHS: Curator fund of knowledge Physical Health Supportive family/friends   PROBLEM LIST: Problem List/Patient Goals Date to be addressed Date deferred Reason deferred Estimated date of resolution  Depression 01/08/2014     Anxiety 01/08/2014     Safety 01/08/2014     "withdrawal under control" 01/08/2014     "Want to get my meds where I'm stable to make my depression and anxiety go away." 01/08/2014                              DISCHARGE CRITERIA:  Ability to meet basic life and health needs Adequate post-discharge living arrangements Motivation to continue treatment in a less acute level of care Withdrawal symptoms are absent or subacute and managed without 24-hour nursing intervention  PRELIMINARY DISCHARGE PLAN: Attend 12-step recovery group Determine safe living arrangements  PATIENT/FAMIILY INVOLVEMENT: This treatment plan has been presented to and reviewed with the patient, Chad Morgan.  The patient and family have been given the opportunity to ask questions and make suggestions.  [name] A 01/08/2014, 6:11 PM

## 2014-01-08 NOTE — Progress Notes (Signed)
ADMISSION NOTE: D: Patient alert and oriented. Pt's mood and affect is depressed, anxious, and flat. Present from [facility] ED requesting alcohol detox. Pt reports he drinks "fifth to a half gallon daily." Pt reports current withdrawal symptoms of anxiety, tremors, sweats, nausea, blurry vision, and decreased appetite. Pt reports hx of seizure with detox, last seizure "one year ago." Pt reports his longest period of sobriety was "two years." Pt reports mental health history of depression and anxiety. Pt reports his current stressors are recent conflict with girlfriend, current custody battle for his three children with his ex-wife, and "[place]" stress. When asked if pt could be more specific about military concerns he looked away from RN and did not respond. Pt asked if he has medical history of PTSD, he stated "not diagnosed." Pt reports his goal is to "withdrawal" safely at [facility], and get "my meds where I'm stable and help my depression and anxiety go away." Pt denies SI/HI and AVH at this time. A: Active listening by RN. Encouragement/Support provided to pt. Pt oriented to unit. Pt given nutrition. Orders acknowledged. 15 minute checks initiated per protocol for patient safety. R: Pt oriented to unit with ease. Patient cooperative and receptive to nursing interventions.

## 2014-01-08 NOTE — Progress Notes (Signed)
CM spoke with pt who confirms self pay [facility] resident with no pcp. CM discussed and provided written information for self pay pcps, importance of pcp for f/u care, www.needymeds.org, www.goodrx.com, discounted pharmacies and other [employer] such as [employer], affordable care act,  financial assistance, DSS and  health department  Reviewed resources for [employer] self pay pcps like [facility], family medicine at [address], [facility] family practice, general medical clinics, [facility] urgent care plus others, medication resources, [facility] out patient pharmacies and housing Pt voiced understanding and appreciation of resources provided   Provided [facility] contact information

## 2014-01-08 NOTE — ED Notes (Signed)
CIWA greater than 10 times 2. MD evaluated. Safety maintained.

## 2014-01-08 NOTE — BH Assessment (Signed)
[place] Assessment Progress Note  Pt has been accepted to [facility] by Corena Pilgrim, MD. Per Letitia Libra, RN, [facility], pt is assigned to Rm [id number]. Pt has signed Voluntary Admission and Consent for Treatment, as well as Consent to Release Information. Signed forms have been faxed to [facility]. Pt's nurse has been notified. She agrees to send original forms to [facility] along with the pt via [place], and to call report to [phone].  Jalene Mullet, MA Triage Specialist 01/08/2014 @ 15:17

## 2014-01-08 NOTE — ED Notes (Signed)
Patient is resting comfortably. 

## 2014-01-08 NOTE — Progress Notes (Signed)
Patient expressed concerns about his knife that security locked up at [facility] ED. No knife present during belongings search upon arrival to [facility]. Pt wanted to make sure he would be able to get that with his belongings once he left [facility]. RN called security whom stated he could retrieve knife post discharge from [facility] by reporting to [facility] ED and requesting belongings. Pt made aware and verbalizes understanding.

## 2014-01-08 NOTE — ED Notes (Signed)
Pt reports that he wants help with detox and tried to detox by himself a year ago and had a seizure. Pt reports that he has anxiety and depression with no si or hi thoughts. Pt's says that he has lack of support with a breakup with his girlfriend. His father is living at [facility] and he reports that he does not know where his mother is living. Offered support and encouragement. Safety maintained in TCU.

## 2014-01-09 ENCOUNTER — Encounter (HOSPITAL_COMMUNITY): Payer: Self-pay | Admitting: Psychiatry

## 2014-01-09 DIAGNOSIS — F1020 Alcohol dependence, uncomplicated: Secondary | ICD-10-CM

## 2014-01-09 DIAGNOSIS — F4010 Social phobia, unspecified: Secondary | ICD-10-CM | POA: Diagnosis present

## 2014-01-09 DIAGNOSIS — F411 Generalized anxiety disorder: Secondary | ICD-10-CM

## 2014-01-09 DIAGNOSIS — F321 Major depressive disorder, single episode, moderate: Secondary | ICD-10-CM | POA: Diagnosis present

## 2014-01-09 DIAGNOSIS — F418 Other specified anxiety disorders: Secondary | ICD-10-CM

## 2014-01-09 DIAGNOSIS — F410 Panic disorder [episodic paroxysmal anxiety] without agoraphobia: Secondary | ICD-10-CM

## 2014-01-09 MED ORDER — HYDROXYZINE HCL 25 MG PO TABS: 25.0000 mg | ORAL_TABLET | Freq: Four times a day (QID) | ORAL | Status: DC | PRN

## 2014-01-09 MED ORDER — ADULT MULTIVITAMIN W/MINERALS CH
1.0000 | ORAL_TABLET | Freq: Every day | ORAL | Status: DC
  Administered 2014-01-09 – 2014-01-10 (×2): 1 via ORAL
  Filled 2014-01-09 (×5): qty 1

## 2014-01-09 MED ORDER — LORAZEPAM 1 MG PO TABS
1.0000 mg | ORAL_TABLET | Freq: Three times a day (TID) | ORAL | Status: DC
  Administered 2014-01-10 (×2): 1 mg via ORAL
  Filled 2014-01-09 (×2): qty 1

## 2014-01-09 MED ORDER — THIAMINE HCL 100 MG/ML IJ SOLN: 100.0000 mg | Freq: Once | INTRAMUSCULAR | Status: DC

## 2014-01-09 MED ORDER — LORAZEPAM 1 MG PO TABS: 1.0000 mg | ORAL_TABLET | Freq: Two times a day (BID) | ORAL | Status: DC

## 2014-01-09 MED ORDER — LOPERAMIDE HCL 2 MG PO CAPS
2.0000 mg | ORAL_CAPSULE | ORAL | Status: DC | PRN
  Administered 2014-01-09: 2 mg via ORAL
  Filled 2014-01-09: qty 1

## 2014-01-09 MED ORDER — LORAZEPAM 1 MG PO TABS: 1.0000 mg | ORAL_TABLET | Freq: Four times a day (QID) | ORAL | Status: DC | PRN

## 2014-01-09 MED ORDER — VITAMIN B-1 100 MG PO TABS
100.0000 mg | ORAL_TABLET | Freq: Every day | ORAL | Status: DC
Start: 2014-01-10 — End: 2014-01-10
  Administered 2014-01-10: 100 mg via ORAL
  Filled 2014-01-09 (×3): qty 1

## 2014-01-09 MED ORDER — LORAZEPAM 1 MG PO TABS: 1.0000 mg | ORAL_TABLET | Freq: Every day | ORAL | Status: DC

## 2014-01-09 MED ORDER — LOPERAMIDE HCL 2 MG PO CAPS: 2.0000 mg | ORAL_CAPSULE | ORAL | Status: DC | PRN

## 2014-01-09 MED ORDER — LORAZEPAM 1 MG PO TABS
1.0000 mg | ORAL_TABLET | Freq: Four times a day (QID) | ORAL | Status: AC
  Administered 2014-01-09 (×3): 1 mg via ORAL
  Filled 2014-01-09 (×3): qty 1

## 2014-01-09 MED ORDER — ONDANSETRON 4 MG PO TBDP
4.0000 mg | ORAL_TABLET | Freq: Three times a day (TID) | ORAL | Status: DC | PRN
  Administered 2014-01-09: 4 mg via ORAL

## 2014-01-09 MED ORDER — ONDANSETRON 4 MG PO TBDP: 4.0000 mg | ORAL_TABLET | Freq: Four times a day (QID) | ORAL | Status: DC | PRN
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
## 2014-01-09 NOTE — BHH Group Notes (Signed)
The focus of this group is to educate the patient on the purpose and policies of crisis stabilization and provide a format to answer questions about their admission.  The group details unit policies and expectations of patients while admitted.  Patient did not attend 0900 nurse education orientation group this morning.  Patient stayed in bed.   

## 2014-01-09 NOTE — Progress Notes (Signed)
Recreation Therapy Notes  Animal-Assisted Activity/Therapy (AAA/T) Program Checklist/Progress Notes Patient Eligibility Criteria Checklist & Daily Group note for Rec Tx Intervention  Date: 01.05.2016 Time: 2:45pm Location: [address]    AAA/T Program Assumption of Risk Form signed by Patient/ or Parent Legal Guardian yes  Patient is free of allergies or sever asthma yes  Patient reports no fear of animals yes  Patient reports no history of cruelty to animals yes  Patient understands his/her participation is voluntary yes  Patient washes hands before animal contact yes  Patient washes hands after animal contact yes  Behavioral Response: Did not attend.   Laureen Ochs Daylen Hack, LRT/CTRS  Lane Hacker 01/09/2014 4:34 PM

## 2014-01-09 NOTE — BHH Counselor (Signed)
Adult Comprehensive Assessment  Patient ID: STEEN BISIG, male   DOB: September 11, 1980, 34 y.o.   MRN: [id number]  Information Source: Information source: Patient  Current Stressors:  Educational / Learning stressors: None Employment / Job issues: Patient has been unemployed for several months. Family Relationships: None Financial / Lack of resources (include bankruptcy): Struggling due to no souce of income Housing / Lack of housing: Patient is homeless Physical health (include injuries & life threatening diseases):  None Social relationships: Does not like to be around people Substance abuse: Patient reports drinking a fifth to a half gallon on liquor daily Bereavement / Loss: None  Living/Environment/Situation:  Living Arrangements: Alone, Other (Comment) (Homeless) Living conditions (as described by patient or guardian): Homeless How long has patient lived in current situation?: since 2012 What is atmosphere in current home: Temporary  Family History:  Marital status: Separated Separated, when?: 2012 What types of issues is patient dealing with in the relationship?: Patient current girlfriend requesting he get help for alcohol problems Additional relationship information: N/A Does patient have children?: Yes How many children?: 3 How is patient's relationship with their children?: Okay relationship with children ages7, 8, and 10  Childhood History:  By whom was/is the patient raised?: Father, Both parents Additional childhood history information: Not good - patient very guarded in his response Description of patient's relationship with caregiver when they were a child: Difficult Patient's description of current relationship with people who raised him/her: Difficult Does patient have siblings?: Yes Number of Siblings: 2 Description of patient's current relationship with siblings: okay with half brother but not good with step sister Did patient suffer any  verbal/emotional/physical/sexual abuse as a child?: No Did patient suffer from severe childhood neglect?: No Has patient ever been sexually abused/assaulted/raped as an adolescent or adult?: No Was the patient ever a victim of a crime or a disaster?: No Witnessed domestic violence?: No Has patient been effected by domestic violence as an adult?: No  Education:  Highest grade of school patient has completed: Psychiatrist Currently a student?: No Learning disability?: No  Employment/Work Situation:   Employment situation: Unemployed Patient's job has been impacted by current illness: No What is the longest time patient has a held a job?: Seven years 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 Has patient ever been in the [employer]?: No Has patient ever served in Recruitment consultant?: No  Financial Resources:   Financial resources: No income Does patient have a Programmer, applications or guardian?: No  Alcohol/Substance Abuse:   If attempted suicide, did drugs/alcohol play a role in this?: No Alcohol/Substance Abuse Treatment Hx: Past Tx, Inpatient If yes, describe treatment: Day by Day - several years ago Has alcohol/substance abuse ever caused legal problems?: No  Social Support System:   Heritage manager System: None Describe Community Support System: N/A Type of faith/religion: None How does patient's faith help to cope with current illness?: N/A  Leisure/Recreation:   Leisure and Hobbies: None at this time  Strengths/Needs:   What things does the patient do well?: Good work history In what areas does patient struggle / problems for patient: Anxiety and depression  Discharge Plan:   Does patient have access to transportation?: Yes Will patient be returning to same living situation after discharge?: Yes Currently receiving community mental health services: No If no, would patient like referral for services when discharged?: Yes (What county?)  ([place]) Does patient have financial barriers related to discharge medications?: Yes Patient  description of barriers related to discharge medications: No income or insurance.  Summary/Recommendations:  Reeves Musick is a 34 years old Caucasian male admitted with Alcohol Abuse Disorder.  He will benefit from crisis stabilization, detox, evaluation for medication, psycho-education groups for coping skills development, group therapy and case management for discharge planning.     Jordyn Hofacker, [place] Post. 01/09/2014

## 2014-01-09 NOTE — Progress Notes (Addendum)
Patient received pneumonia vaccine this morning.  Patient stated he was given flu vaccine already this year.  Patient has been up this morning, talked to MD, coming to medication window.  Went to dining room for lunch, stated his stomach was feeling better, nausea decreased.  Patient denied SI and HI, contracts for safety.  Denied A/V hallucinations.  D:  Patient's self inventory sheet, patient stated he slept good last night, sleep medication was helpful.  Good appetite, normal energy level, good concentration.  Denied depression, hopeless and anxiety.   Withdrawals continue.  Denied SI.  Denied physical problems.  Denied physical pain.  Goal is to get home safely.  Plans to discharge and go to a home that is alcohol free and safe.  Plans to attend in person at the [place].  Does have discharge plans.  No problems anticipated after discharge. A:  Medications administered per MD orders.  Emotional support and encouragement given patient. R:  Denied SI and HI, contracts for safety.  Denied A/V hallucinations.  Safety maintained with 15 minute checks.

## 2014-01-09 NOTE — Progress Notes (Signed)
Pt has been in his room in bed most of the evening, but did come out to get a snack and watch TV for a little while.  Pt reports he is having moderate withdrawal symptoms tonight.  Pt was encouraged to make his needs known to staff.  Pt voiced understanding.  Pt denies SI/HI/AV at this time.  Pt plans to go to long term treatment after detox.  Support and encouragement offered.  Safety maintained with q15 minute checks.
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
## 2014-01-09 NOTE — BHH Group Notes (Signed)
[facility] LCSW Group Therapy  Feelings Around Diagnosis  01/09/2014 2:43 PM  Type of Therapy:  Group Therapy  Participation Level:  Did Not Attend - patient in bed.   Concha Pyo 01/09/2014, 2:43 PM

## 2014-01-09 NOTE — BHH Suicide Risk Assessment (Signed)
Suicide Risk Assessment  Admission Assessment     Nursing information obtained from:  Patient Demographic factors:  Male, Divorced or widowed, Caucasian, Living alone Current Mental Status:  NA Loss Factors:  Loss of significant relationship, Financial problems / change in socioeconomic status Historical Factors:  NA Risk Reduction Factors:  Responsible for children under 33 years of age Total Time spent with patient: 45 minutes  CLINICAL FACTORS:   Severe Anxiety and/or Agitation Panic Attacks Depression:   Comorbid alcohol abuse/dependence Alcohol/Substance Abuse/Dependencies  COGNITIVE FEATURES THAT CONTRIBUTE TO RISK:  Closed-mindedness Polarized thinking Thought constriction (tunnel vision)    SUICIDE RISK:   Moderate:  PLAN OF CARE: Supportive approach/coping skills/relapse prevention                               Ativan detox protocol                               Reassess and address the co morbities  I certify that inpatient services furnished can reasonably be expected to improve the patient's condition.  Keyondre Hepburn A 01/09/2014, 5:39 PM

## 2014-01-09 NOTE — Plan of Care (Signed)
Problem: Consults Goal: Depression Patient Education See Patient Education Module for education specifics.  Outcome: Completed/Met Date Met:  01/09/14 Nurse discussed depression with patient.

## 2014-01-10 DIAGNOSIS — F329 Major depressive disorder, single episode, unspecified: Secondary | ICD-10-CM

## 2014-01-10 DIAGNOSIS — F10230 Alcohol dependence with withdrawal, uncomplicated: Secondary | ICD-10-CM | POA: Insufficient documentation

## 2014-01-10 DIAGNOSIS — F331 Major depressive disorder, recurrent, moderate: Secondary | ICD-10-CM | POA: Insufficient documentation

## 2014-01-10 MED ORDER — LORAZEPAM 1 MG PO TABS: ORAL_TABLET | ORAL | Status: DC

## 2014-01-10 MED ORDER — ACETAMINOPHEN 325 MG PO TABS: 650.0000 mg | ORAL_TABLET | Freq: Four times a day (QID) | ORAL | Status: DC | PRN

## 2014-01-10 MED ORDER — TRAZODONE HCL 100 MG PO TABS: 100.0000 mg | ORAL_TABLET | Freq: Every evening | ORAL | Status: DC | PRN

## 2014-01-10 MED ORDER — CITALOPRAM HYDROBROMIDE 20 MG PO TABS: 20.0000 mg | ORAL_TABLET | Freq: Every day | ORAL | Status: DC
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
## 2014-01-10 NOTE — BHH Suicide Risk Assessment (Signed)
Suicide Risk Assessment  Discharge Assessment     Demographic Factors:  Male and Caucasian  Total Time spent with patient: 30 minutes  Psychiatric Specialty Exam:     Blood pressure 127/68, pulse 68, temperature 97.8 F (36.6 C), temperature source Oral, resp. rate 18, height 6' 1.25" (1.861 m), weight 100.699 kg (222 lb).Body mass index is 29.08 kg/(m^2).  General Appearance: Fairly Groomed  Engineer, water::  Fair  Speech:  Clear and Coherent  Volume:  Decreased  Mood:  Euthymic  Affect:  Restricted  Thought Process:  Coherent and Goal Directed  Orientation:  Full (Time, Place, and Person)  Thought Content:  plans as he moves on, relapse prevention plan  Suicidal Thoughts:  No  Homicidal Thoughts:  No  Memory:  Immediate;   Fair Recent;   Fair Remote;   Fair  Judgement:  Fair  Insight:  Present  Psychomotor Activity:  Normal  Concentration:  Fair  Recall:  [employer] [place]: Fair  Akathisia:  No  Handed:  Right  AIMS (if indicated):     Assets:  Resilience Social Support  Sleep:  Number of Hours: 6.25    Musculoskeletal: Strength & Muscle Tone: within normal limits Gait & Station: normal Patient leans: N/A   Mental Status Per Nursing Assessment::   On Admission:  NA  Current Mental Status by Physician: In full contact with reality. There are no active S/S of withdrawal. He states he feels ready to be D/C today. States he is going to stay with a friend who is sober. He also plans to go to the [place] for services. States he is motivated to do the work. Does say that he will benefit more from outpatient/individual  treatment as he has a hard time being around a lot of people and being in groups   Loss Factors: NA  Historical Factors: NA  Risk Reduction Factors:   Living with another person, especially a relative and Positive social support  Continued Clinical Symptoms:  Severe Anxiety and/or Agitation Panic Attacks Alcohol/Substance  Abuse/Dependencies  Cognitive Features That Contribute To Risk:  Closed-mindedness Polarized thinking Thought constriction (tunnel vision)    Suicide Risk:  Minimal: No identifiable suicidal ideation.  Patients presenting with no risk factors but with morbid ruminations; may be classified as minimal risk based on the severity of the depressive symptoms  Discharge Diagnoses:   AXIS I:  Alcohol Dependence, Alcohol Withdrawal, GAD, Social Anxiety, Panic Attacks R/O PTSD AXIS II:  No diagnosis AXIS III:   Past Medical History  Diagnosis Date  . Seizures   . Anxiety   . Depression    AXIS IV:  other psychosocial or environmental problems AXIS V:  61-70 mild symptoms  Plan Of Care/Follow-up recommendations:  Activity:  as tolerated Diet:  regular Follow up [facility] the detox on outpatient basis Is patient on multiple antipsychotic therapies at discharge:  No   Has Patient had three or more failed trials of antipsychotic monotherapy by history:  No  Recommended Plan for Multiple Antipsychotic Therapies: NA    Makayah Pauli A 01/10/2014, 12:15 PM
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
## 2014-01-10 NOTE — BHH Group Notes (Signed)
[facility] LCSW Aftercare Discharge Planning Group Note   01/10/2014 11:33 AM    Participation Quality:  Appropraite  Mood/Affect:  Appropriate  Depression Rating:  1  Anxiety Rating:  1  Thoughts of Suicide:  No  Will you contract for safety?   NA  Current AVH:  No  Plan for Discharge/Comments:  Patient attended discharge planning group and actively participated in group. He reports doing well and hopes to discharge today.  He will follow up with [facility].  Patient also advised he plans to follow up with the [facility].  Suicide prevention education reviewed and SPE document provided.   Transportation Means: Patient has transportation.   Supports:  Patient has a support system.   Ameliana Brashear, Eulas Post

## 2014-01-10 NOTE — Progress Notes (Signed)
Nutrition Brief Note Patient identified on the Malnutrition Screening Tool (MST) Report.  Wt Readings from Last 10 Encounters:  01/08/14 222 lb (100.699 kg)  01/06/14 220 lb (99.791 kg)   Body mass index is 29.08 kg/(m^2). Patient meets criteria for overweight based on current BMI.   Discussed intake PTA with patient and compared to intake presently.  Discussed changes in intake, if any, and encouraged adequate intake of meals and snacks. Current diet order is regular and pt is also offered choice of unit snacks mid-morning and mid-afternoon.  Pt is eating as desired.   Labs and medications reviewed.   Nutrition Dx:  Unintended wt change r/t suboptimal oral intake AEB pt report  Interventions: . Discussed the importance of nutrition and encouraged intake of food and beverages.   . Discussed weight goals with patient. . Supplements: none    No additional nutrition interventions warranted at this time. If nutrition issues arise, please consult RD.   Laurette Schimke MS, RD, LDN

## 2014-01-10 NOTE — Progress Notes (Signed)
[facility] Adult Case Management Discharge Plan :  Will you be returning to the same living situation after discharge: No.  Patient plans to stay with a friend. At discharge, do you have transportation home?:Yes,  Patient assisted with bus pass. Do you have the ability to pay for your medications:No.  Patient needs assistance with indigent medications   Release of information consent forms completed and in the chart;  Patient's signature needed at discharge.  Patient to Follow up at: Follow-up Information    Follow up with [facility] On 01/15/2014.   Why:  [facility] on Monday, January 15, 2014 or any weekday between 8AM-12 Noon or 1PM - 3PM for medication management counseling   Contact information:   [address] [place], [place]   [address]  [phone]      Patient denies SI/HI:  Patient not endorsing SI/HI or other thoughts of self harm prior to admission nor during hospitalization..  Safety Planning and Suicide Prevention discussed:  .[email or web address]  Patient refused referral  Concha Pyo 01/10/2014, 11:34 AM

## 2014-01-10 NOTE — Clinical Social Work Note (Signed)
CSW spoke patient's girlfriend who had no concerns regarding patient discharging from the hospital today.  SPE not reviewed as patient was not endorsing SI on admission nor during hospitalization.

## 2014-01-10 NOTE — Progress Notes (Signed)
Pt reports he is feeling much better and says the withdrawal symptoms are decreasing.  He says he has been going to groups today.  Writer does observe that pt has been doing a lot of pacing this evening, although pt does not voice any needs or concerns.  Pt requested something to help him sleep which was given.  Discharge plans still in process as pt contemplates long term treatment.  Pt denies SI/HI/AV.  Pt makes his needs known to staff.  Pt is pleasant/cooperative with staff.  Support and encouragement offered.  Safety maintained with q15 minute checks.

## 2014-01-12 NOTE — Progress Notes (Signed)
Patient Discharge Instructions:  After Visit Summary (AVS):   Faxed to:  01/12/14 Discharge Summary Note:   Faxed to:  01/12/14 Psychiatric Admission Assessment Note:   Faxed to:  01/12/14 Suicide Risk Assessment - Discharge Assessment:   Faxed to:  01/12/14 Faxed/Sent to the [facility] Care provider:  01/12/14 Faxed to [facility], 01/12/2014, 4:02 PM

## 2014-01-20 ENCOUNTER — Encounter (HOSPITAL_COMMUNITY): Payer: Self-pay | Admitting: Emergency Medicine

## 2014-01-20 ENCOUNTER — Emergency Department (HOSPITAL_COMMUNITY)
Admission: EM | Admit: 2014-01-20 | Discharge: 2014-01-20 | Disposition: A | Discharge status: Home / Self Care | Payer: Self-pay | Attending: Emergency Medicine | Admitting: Emergency Medicine

## 2014-01-20 DIAGNOSIS — F419 Anxiety disorder, unspecified: Secondary | ICD-10-CM | POA: Insufficient documentation

## 2014-01-20 DIAGNOSIS — G40909 Epilepsy, unspecified, not intractable, without status epilepticus: Secondary | ICD-10-CM | POA: Insufficient documentation

## 2014-01-20 DIAGNOSIS — Z79899 Other long term (current) drug therapy: Secondary | ICD-10-CM | POA: Insufficient documentation

## 2014-01-20 DIAGNOSIS — F329 Major depressive disorder, single episode, unspecified: Secondary | ICD-10-CM | POA: Insufficient documentation

## 2014-01-20 DIAGNOSIS — F1029 Alcohol dependence with unspecified alcohol-induced disorder: Secondary | ICD-10-CM | POA: Insufficient documentation

## 2014-01-20 DIAGNOSIS — Z720 Tobacco use: Secondary | ICD-10-CM | POA: Insufficient documentation

## 2014-01-20 HISTORY — DX: Alcohol abuse, uncomplicated: F10.10

## 2014-01-20 MED ORDER — CHLORDIAZEPOXIDE HCL 25 MG PO CAPS: 50.0000 mg | ORAL_CAPSULE | Freq: Three times a day (TID) | ORAL | Status: DC | PRN

## 2014-01-20 NOTE — ED Provider Notes (Signed)
CSN: [id number]     Arrival date & time 01/20/14  1211 History  This chart was scribed for non-physician practitioner working with Dorie Rank, MD by Mercy Moore, ED Scribe. This patient was seen in room WTR2/WLPT2 and the patient's care was started at 1:07 PM.   Chief Complaint  Patient presents with  . Alcohol Problem    requesting medication to prevent alcohol withdrawal   The history is provided by the patient. No language interpreter was used.   HPI Comments: Chad Morgan is a 34 y.o. male who presents to the Emergency Department requesting medication to mediate his alcohol withdrawal symptoms. Per chart history, patient requested help with detoxification 01/06/2014. Patient reports that he lost his prescription for his Ativan and began drinking within the two weeks since his visit to prevent his symptoms. Patient shares history of tremors, seizures and vomiting when withdrawing. Patient denies audio/visual hallucinations, homicidal or suicidal ideations or additional substance abuse. Patient expresses interest medication to reduce his cravings for alcohol.  Past Medical History  Diagnosis Date  . Seizures   . Anxiety   . Depression   . Alcohol abuse    Past Surgical History  Procedure Laterality Date  . Wisdom tooth extraction     History reviewed. No pertinent family history. History  Substance Use Topics  . Smoking status: Current Every Day Smoker -- 1.00 packs/day    Types: Cigarettes  . Smokeless tobacco: Not on file  . Alcohol Use: Yes     Comment: 1/5 to 1/2 gallon of liquor daily    Review of Systems  Constitutional: Negative for fever and chills.  Neurological: Negative for tremors.  Psychiatric/Behavioral: Negative for suicidal ideas and hallucinations.    Allergies  Review of patient's allergies indicates no known allergies.  Home Medications   Prior to Admission medications   Medication Sig Start Date End Date Taking? Authorizing Provider  acetaminophen  (TYLENOL) 325 MG tablet Take 2 tablets (650 mg total) by mouth every 6 (six) hours as needed for moderate pain. 01/10/14   Encarnacion Slates, NP  chlordiazePOXIDE (LIBRIUM) 25 MG capsule Take 2 capsules (50 mg total) by mouth 3 (three) times daily as needed for anxiety. 01/20/14   Anthonie Lotito Marilu Favre, PA-C  citalopram (CELEXA) 20 MG tablet Take 1 tablet (20 mg total) by mouth daily. For depression 01/10/14   Encarnacion Slates, NP  LORazepam (ATIVAN) 1 MG tablet Take 1 tablet (1 mg) three times daily x 2 days: #6 tablets Take 1 tablet (1 mg) twice daily x 2 days: #4 tablets Take 1 tablet (1 mg) daily x 2 days: #2 tablets: For alcohol withdrawal symptoms. # of tablets: 12 01/10/14   Encarnacion Slates, NP  traZODone (DESYREL) 100 MG tablet Take 1 tablet (100 mg total) by mouth at bedtime as needed for sleep. 01/10/14   Encarnacion Slates, NP   Triage Vitals: BP 115/79 mmHg  Pulse 97  Temp(Src) 98.1 F (36.7 C) (Oral)  Resp 18  Wt 220 lb (99.791 kg)  SpO2 96% Physical Exam  Constitutional: He appears well-developed and well-nourished. No distress.  HENT:  Head: Normocephalic and atraumatic.  Eyes: Pupils are equal, round, and reactive to light.  Neck: Normal range of motion. Neck supple.  Cardiovascular: Normal rate and regular rhythm.   Pulmonary/Chest: Effort normal.  Abdominal: Soft.  Neurological: He is alert.  Skin: Skin is warm and dry.  Psychiatric: His mood appears not anxious. His speech is not rapid and/or pressured.  He is not agitated. He does not exhibit a depressed mood. He expresses no homicidal and no suicidal ideation.  Nursing note and vitals reviewed.   ED Course  Procedures (including critical care time)  COORDINATION OF CARE: 1:10 PM- Plans to discharge patient with new prescriptions, Librium. Discussed treatment plan with patient at bedside and patient agreed to plan.   Labs Review Labs Reviewed - No data to display  Imaging Review No results found.   EKG Interpretation None       MDM   Final diagnoses:  Alcohol dependence with unspecified alcohol-induced disorder    Discussed case with Dr. Tomi Bamberger, he recommends 50mg  TID Librium for 2-4 days so long as the patient isnt meeting any emergent/urgent criteria.  Pt not acitvely withdrawing from alcohol, he is not in withdrawals actively. He appears well, calm. No SI/HI,  34 y.o.Chad Morgan's evaluation in the Emergency Department is complete. It has been determined that no acute conditions requiring further emergency intervention are present at this time. The patient/guardian have been advised of the diagnosis and plan. We have discussed signs and symptoms that warrant return to the ED, such as changes or worsening in symptoms.  Vital signs are stable at discharge. Filed Vitals:   01/20/14 1238  BP: 115/79  Pulse: 97  Temp: 98.1 F (36.7 C)  Resp: 18    Patient/guardian has voiced understanding and agreed to follow-up with the PCP or specialist.  I personally performed the services described in this documentation, which was scribed in my presence. The recorded information has been reviewed and is accurate.    Linus Mako, PA-C 01/20/14 1317  Linus Mako, PA-C 01/20/14 1318  Dorie Rank, MD 01/20/14 1323

## 2014-01-20 NOTE — ED Notes (Signed)
Pt is requesting medications to prevent withdrawal symptoms. Pt was given prescriptions 2 weeks ago, had them filled then lost them. Pt admits to constant alcohol use over last two weeks, including today. Denies tremors today

## 2014-01-20 NOTE — Discharge Instructions (Signed)
Alcohol Withdrawal °Anytime drug use is interfering with normal living activities it has become abuse. This includes problems with family and friends. Psychological dependence has developed when your mind tells you that the drug is needed. This is usually followed by physical dependence when a continuing increase of drugs are required to get the same feeling or "high." This is known as addiction or chemical dependency. A person's risk is much higher if there is a history of chemical dependency in the family. °Mild Withdrawal Following Stopping Alcohol, When Addiction or Chemical Dependency Has Developed °When a person has developed tolerance to alcohol, any sudden stopping of alcohol can cause uncomfortable physical symptoms. Most of the time these are mild and consist of tremors in the hands and increases in heart rate, breathing, and temperature. Sometimes these symptoms are associated with anxiety, panic attacks, and bad dreams. There may also be stomach upset. Normal sleep patterns are often interrupted with periods of inability to sleep (insomnia). This may last for 6 months. Because of this discomfort, many people choose to continue drinking to get rid of this discomfort and to try to feel normal. °Severe Withdrawal with Decreased or No Alcohol Intake, When Addiction or Chemical Dependency Has Developed °About five percent of alcoholics will develop signs of severe withdrawal when they stop using alcohol. One sign of this is development of generalized seizures (convulsions). Other signs of this are severe agitation and confusion. This may be associated with believing in things which are not real or seeing things which are not really there (delusions and hallucinations). Vitamin deficiencies are usually present if alcohol intake has been long-term. Treatment for this most often requires hospitalization and close observation. °Addiction can only be helped by stopping use of all chemicals. This is hard but may  save your life. With continual alcohol use, possible outcomes are usually loss of self respect and esteem, violence, and death. °Addiction cannot be cured but it can be stopped. This often requires outside help and the care of professionals. Treatment centers are listed in the yellow pages under Cocaine, Narcotics, and Alcoholics Anonymous. Most hospitals and clinics can refer you to a specialized care center. °It is not necessary for you to go through the uncomfortable symptoms of withdrawal. Your caregiver can provide you with medicines that will help you through this difficult period. Try to avoid situations, friends, or drugs that made it possible for you to keep using alcohol in the past. Learn how to say no. °It takes a long period of time to overcome addictions to all drugs, including alcohol. There may be many times when you feel as though you want a drink. After getting rid of the physical addiction and withdrawal, you will have a lessening of the craving which tells you that you need alcohol to feel normal. Call your caregiver if more support is needed. Learn who to talk to in your family and among your friends so that during these periods you can receive outside help. Alcoholics Anonymous (AA) has helped many people over the years. To get further help, contact AA or call your caregiver, counselor, or clergyperson. Al-Anon and Alateen are support groups for friends and family members of an alcoholic. The people who love and care for an alcoholic often need help, too. For information about these organizations, check your phone directory or call a local alcoholism treatment center.  °SEEK IMMEDIATE MEDICAL CARE IF:  °· You have a seizure. °· You have a fever. °· You experience uncontrolled vomiting or you   vomit up blood. This may be bright red or look like black coffee grounds. °· You have blood in the stool. This may be bright red or appear as a black, tarry, bad-smelling stool. °· You become lightheaded or  faint. Do not drive if you feel this way. Have someone else drive you or call 911 for help. °· You become more agitated or confused. °· You develop uncontrolled anxiety. °· You begin to see things that are not really there (hallucinate). °Your caregiver has determined that you completely understand your medical condition, and that your mental state is back to normal. You understand that you have been treated for alcohol withdrawal, have agreed not to drink any alcohol for a minimum of 1 day, will not operate a car or other machinery for 24 hours, and have had an opportunity to ask any questions about your condition. °Document Released: 10/01/2004 Document Revised: 03/16/2011 Document Reviewed: 08/10/2007 °ExitCare® Patient Information ©2015 [employer]. This information is not intended to replace advice given to you by your health care provider. Make sure you discuss any questions you have with your health care provider. ° °

## 2014-02-06 ENCOUNTER — Encounter (HOSPITAL_COMMUNITY): Payer: Self-pay

## 2014-02-06 ENCOUNTER — Emergency Department (HOSPITAL_COMMUNITY)
Admission: EM | Admit: 2014-02-06 | Discharge: 2014-02-06 | Disposition: A | Discharge status: Home / Self Care | Payer: Self-pay | Attending: Emergency Medicine | Admitting: Emergency Medicine

## 2014-02-06 DIAGNOSIS — F329 Major depressive disorder, single episode, unspecified: Secondary | ICD-10-CM | POA: Insufficient documentation

## 2014-02-06 DIAGNOSIS — F1310 Sedative, hypnotic or anxiolytic abuse, uncomplicated: Secondary | ICD-10-CM | POA: Insufficient documentation

## 2014-02-06 DIAGNOSIS — G40909 Epilepsy, unspecified, not intractable, without status epilepticus: Secondary | ICD-10-CM | POA: Insufficient documentation

## 2014-02-06 DIAGNOSIS — F10120 Alcohol abuse with intoxication, uncomplicated: Secondary | ICD-10-CM | POA: Insufficient documentation

## 2014-02-06 DIAGNOSIS — F419 Anxiety disorder, unspecified: Secondary | ICD-10-CM | POA: Insufficient documentation

## 2014-02-06 DIAGNOSIS — Y908 Blood alcohol level of 240 mg/100 ml or more: Secondary | ICD-10-CM | POA: Insufficient documentation

## 2014-02-06 DIAGNOSIS — F10920 Alcohol use, unspecified with intoxication, uncomplicated: Secondary | ICD-10-CM

## 2014-02-06 DIAGNOSIS — Z720 Tobacco use: Secondary | ICD-10-CM | POA: Insufficient documentation

## 2014-02-06 DIAGNOSIS — Z79899 Other long term (current) drug therapy: Secondary | ICD-10-CM | POA: Insufficient documentation

## 2014-02-06 LAB — CBC
HCT: 40.8 % (ref 39.0–52.0)
HEMOGLOBIN: 14.1 g/dL (ref 13.0–17.0)
MCH: 34.6 pg — AB (ref 26.0–34.0)
MCHC: 34.6 g/dL (ref 30.0–36.0)
MCV: 100 fL (ref 78.0–100.0)
Platelets: 215 10*3/uL (ref 150–400)
RBC: 4.08 MIL/uL — ABNORMAL LOW (ref 4.22–5.81)
RDW: 13.6 % (ref 11.5–15.5)
WBC: 8.6 10*3/uL (ref 4.0–10.5)

## 2014-02-06 LAB — ETHANOL: ALCOHOL ETHYL (B): 315 mg/dL — AB (ref 0–9)

## 2014-02-06 LAB — COMPREHENSIVE METABOLIC PANEL
ALK PHOS: 48 U/L (ref 39–117)
ALT: 24 U/L (ref 0–53)
AST: 31 U/L (ref 0–37)
Albumin: 4 g/dL (ref 3.5–5.2)
Anion gap: 9 (ref 5–15)
BUN: 12 mg/dL (ref 6–23)
CALCIUM: 8 mg/dL — AB (ref 8.4–10.5)
CO2: 23 mmol/L (ref 19–32)
Chloride: 111 mmol/L (ref 96–112)
Creatinine, Ser: 0.72 mg/dL (ref 0.50–1.35)
GFR calc Af Amer: >90 mL/min (ref >90)
Glucose, Bld: 108 mg/dL — ABNORMAL HIGH (ref 70–99)
Potassium: 3.6 mmol/L (ref 3.5–5.1)
Sodium: 143 mmol/L (ref 135–145)
Total Bilirubin: 0.3 mg/dL (ref 0.3–1.2)
Total Protein: 7 g/dL (ref 6.0–8.3)

## 2014-02-06 LAB — RAPID URINE DRUG SCREEN, HOSP PERFORMED
AMPHETAMINES: NOT DETECTED
Barbiturates: NOT DETECTED
Benzodiazepines: POSITIVE — AB
Cocaine: NOT DETECTED
OPIATES: NOT DETECTED
Tetrahydrocannabinol: NOT DETECTED

## 2014-02-06 NOTE — Progress Notes (Signed)
  CARE MANAGEMENT ED NOTE 02/06/2014  Patient:  Chad Morgan, Chad Morgan   Account Number:  [id number]  Date Initiated:  02/06/2014  Documentation initiated by:  Jackelyn Poling  Subjective/Objective Assessment:   34 yr old [place] self pay ? homeless pt Arrives to ED via [facility] officers.  He reports that he is ready to "sober up."  Admits to recent failed attempts and says he is scared because he has had seizures when detoxing in the past.      Subjective/Objective Assessment Detail:   NO pcp listed  Pt asleep and without response to Cm calling his name when CM went to speak with him     Action/Plan:   attempt to see pt - asleep to attept to see at later time   Action/Plan Detail:   Anticipated DC Date:       Status Recommendation to Physician:   Result of Recommendation:    Other ED Services  Consult [facility]  Other  Outpatient Services - Pt will follow up  PCP issues  GCCN / P4[facility] (established/new)    Choice offered to / List presented to:            Status of service:  Completed, signed off  ED Comments:   ED Comments Detail:

## 2014-02-06 NOTE — ED Notes (Signed)
Pt. Is unable to use the restroom at this time, urinal at bedside.  

## 2014-02-06 NOTE — Discharge Instructions (Signed)
Alcohol Intoxication °Alcohol intoxication occurs when you drink enough alcohol that it affects your ability to function. It can be mild or very severe. Drinking a lot of alcohol in a short time is called binge drinking. This can be very harmful. Drinking alcohol can also be more dangerous if you are taking medicines or other drugs. Some of the effects caused by alcohol may include: °· Loss of coordination. °· Changes in mood and behavior. °· Unclear thinking. °· Trouble talking (slurred speech). °· Throwing up (vomiting). °· Confusion. °· Slowed breathing. °· Twitching and shaking (seizures). °· Loss of consciousness. °HOME CARE °· Do not drive after drinking alcohol. °· Drink enough water and fluids to keep your pee (urine) clear or pale yellow. Avoid caffeine. °· Only take medicine as told by your doctor. °GET HELP IF: °· You throw up (vomit) many times. °· You do not feel better after a few days. °· You frequently have alcohol intoxication. Your doctor can help decide if you should see a substance use treatment counselor. °GET HELP RIGHT AWAY IF: °· You become shaky when you stop drinking. °· You have twitching and shaking. °· You throw up blood. It may look bright red or like coffee grounds. °· You notice blood in your poop (bowel movements). °· You become lightheaded or pass out (faint). °MAKE SURE YOU:  °· Understand these instructions. °· Will watch your condition. °· Will get help right away if you are not doing well or get worse. °Document Released: 06/10/2007 Document Revised: 08/24/2012 Document Reviewed: 05/27/2012 °ExitCare® Patient Information ©2015 [employer]. This information is not intended to replace advice given to you by your health care provider. Make sure you discuss any questions you have with your health care provider. ° ° °Emergency Department Resource Guide °1) Find a Doctor and Pay Out of Pocket °Although you won't have to find out who is covered by your insurance plan, it is a good  idea to ask around and get recommendations. You will then need to call the office and see if the doctor you have chosen will accept you as a new patient and what types of options they offer for patients who are self-pay. Some doctors offer discounts or will set up payment plans for their patients who do not have insurance, but you will need to ask so you aren't surprised when you get to your appointment. ° °2) Contact Your Local Health Department °Not all health departments have doctors that can see patients for sick visits, but many do, so it is worth a call to see if yours does. If you don't know where your local health department is, you can check in your phone book. The CDC also has a tool to help you locate your state's health department, and many state websites also have listings of all of their local health departments. ° °3) Find a Walk-in Clinic °If your illness is not likely to be very severe or complicated, you may want to try a walk in clinic. These are popping up all over the country in pharmacies, drugstores, and shopping centers. They're usually staffed by nurse practitioners or physician assistants that have been trained to treat common illnesses and complaints. They're usually fairly quick and inexpensive. However, if you have serious medical issues or chronic medical problems, these are probably not your best option. ° °No Primary Care Doctor: °- Call [facility] Connect at  832-8000 - they can help you locate a primary care doctor that  accepts your insurance, provides   certain services, etc. °- Physician Referral Service- 1-800-533-3463 ° °Chronic Pain Problems: °Organization         Address  Phone   Notes  °[facility]  (336) 297-2271 Patients need to be referred by their primary care doctor.  ° °Medication Assistance: °Organization         Address  Phone   Notes  °[place] [facility] [address] °[place], [place] [address] (336) 641-8030  --Must be a resident of [place] °-- Must have NO insurance coverage whatsoever (no Medicaid/ Medicare, etc.) °-- The pt. MUST have a primary care doctor that directs their care regularly and follows them in the community °  °[facility]  (866) 331-1348   °[employer]  (888) 892-1162   ° °Agencies that provide inexpensive medical care: °Organization         Address  Phone   Notes  °[facility]  (336) 832-8035   °[facility]    (336) 832-7272   °[facility] [address] °[place], [place] [address] (336) 832-4777   °[facility] [address], °[place] (336) 271-4999   °[facility]    (336) 373-0678   °[facility]    (336) 272-1050   °[facility] ° [address], [place] Phone:  (336) 832-4444, Fax:  (336) 832-4440 Hours of Operation:  9 am - 6 pm, M-F.  Also accepts Medicaid/Medicare and self-pay.  °[facility] ° [address], [place] Phone: (336) 832-3150, Fax: (336) [phone]. Hours of Operation:  8:30 am - 5:30 pm, M-F.  Also accepts Medicaid and self-pay.  °[facility] [place] [address], [place] Phone: (336) 878-6027   °[facility] [address], [place], [place] (336)723-1848, Ext. 123 Mondays & Thursdays: 7-9 AM.  First 15 patients are seen on a first come, first serve basis. °  ° °Medicaid-accepting [place] Providers: ° °Organization         Address  Phone   Notes  °[facility] [address], [place] (336) 641-2100 Also accepts self-pay patients.  °[facility] [address], [place] ° (336) 856-9996   °[facility] [address], [place] (336) 288-8857   °[facility] [address] [place] [address], [place] (336) 299-7000   °Veita Bland [address], [place]  ° (336) 373-1557 Only  accepts [place] Access Medicaid patients after they have their name applied to their card.  ° °Self-Pay (no insurance) in [place]: ° °Organization         Address  Phone   Notes  °Sickle Cell Patients, [facility] [address], [place] (336) 832-1970   °[facility] [address], [place] (336) 832-4400   °[facility] ° [address], [place] (336) 992-4800   °[facility]/Dr. Osei-Bonsu ° [address] [place] [address], [place] or [address], [place] (336) [phone] Phone number for both [place] and [place] locations is the same.  °[facility] [address], [place] (336) 299-0000   °[facility] [address] [place] [address], [place] or [address] (336) 852-7530 °(336) 878-2260   °[facility] [address], [place] (336) 350-1642, phone; (336) 294-5005, fax Sees patients 1st and 3rd Saturday of every month.  Must not qualify for public or private insurance (i.e. Medicaid, Medicare, [place] Health Choice, Veterans' Benefits) • Household income should be no more than 200% of the poverty level •The clinic cannot treat you if you are pregnant or think you are pregnant • Sexually transmitted diseases are not treated at the clinic.  ° ° °Dental Care: °Organization         Address  Phone  Notes  °[facility] [address], [place] (336) 641-6152 Accepts children up to age 21 who are enrolled in Medicaid or [place] Health Choice; pregnant women with a Medicaid card; and children who have applied for Medicaid or [place] Health Choice, but were declined, whose parents can pay a reduced fee at time of service.  °[facility] [place]  [address], [place] (336) 641-7733 Accepts children up to age 21 who are enrolled in Medicaid or [place] Health Choice; pregnant  women with a Medicaid card; and children who have applied for Medicaid or [place] Health Choice, but were declined, whose parents can pay a reduced fee at time of service.  °[facility] ° [address], [place] (336) 641-4533 Patients are seen by appointment only. Walk-ins are not accepted. [facility] will see patients 18 years of age and older. °Monday - Tuesday (8am-5pm) °Most Wednesdays (8:30-5pm) °$30 per visit, cash only  °[facility] ° [address], [place] (336) 641-4533 Patients are seen by appointment only. Walk-ins are not accepted. [facility] will see patients 18 years of age and older. °One Wednesday Evening (Monthly: Volunteer Based).  $30 per visit, cash only  °[facility]  (919) 537-3737 for adults; Children under age 4, call Graduate Pediatric Dentistry at ([phone]) [phone]. Children aged 4-14, please call (919) [phone] to request a pediatric application. ° Dental services are provided in all areas of dental care including fillings, crowns and bridges, complete and partial dentures, implants, gum treatment, root canals, and extractions. Preventive care is also provided. Treatment is provided to both adults and children. °Patients are selected via a lottery and there is often a waiting list. °  °[facility] [address], °[place] ° (336) 763-8833 [email or web address] °  °[facility] [address], [place], [place] (336)723-1848, Ext. 123 Second and Fourth Thursday of each month, opens at 6:30 AM; Clinic ends at 9 AM.  Patients are seen on a first-come first-served basis, and a limited number are seen during each clinic.  ° °[facility] ° [address], [place], [place] (336) 723-7904   Eligibility Requirements °You must have lived in [place], [place], or [place] counties for at least the last three months. °  You cannot be eligible for state or federal sponsored  healthcare insurance, including Veterans Administration, Medicaid, or Medicare. °  You generally cannot be eligible for healthcare insurance through your employer.  °  How to apply: °Eligibility screenings are held every Tuesday and Wednesday afternoon from 1:00 pm until 4:00 pm. You do not need an appointment for the interview!  °[facility] [address], [place],  336-631-2330   °[facility]  336-342-8273   °[facility]  336-703-3100   °[facility]  336-570-6415   ° °  Behavioral Health Resources in the Community: °Intensive Outpatient Programs °Organization         Address  Phone  Notes  °[facility] [address], [place], [place] 336-878-6098   °[facility] [address], [place], [place] 336-832-9800   °[facility] [address], [place], [place] ° 336-882-2125   °[facility] [address],  °[place], [place] 1-800-853-5163 or 336-641-4981   °Substance Abuse Resources °Organization         Address  Phone  Notes  °[facility]  336-882-2125   °[facility]  336-784-9470   °[facility]  336-285-9073   °[facility]  336-845-3988   °Residential & Outpatient Substance Abuse Program  1-800-659-3381   °Psychological Services °Organization         Address  Phone  Notes  °[facility]  336- 832-9600   °[facility]  336- 378-7881   °[facility] [address], [place] 1-800-853-5163 or 336-641-4981   ° °Mobile Crisis Teams °Organization         Address  Phone  Notes  °[facility], Mobile Crisis Care Unit  1-877-626-1772   °Assertive °[facility] ° [address] [place], [place] 336-834-9664   °Sharon DeEsch [address] °[place] 336-554-5454   ° °Self-Help/Support Groups °Organization         Address  Phone              Notes  °[facility] - variety of support groups  336- 373-1402 Call for more information  °Narcotics Anonymous (NA), [facility] [address], °[place]  2 meetings at this location  ° °Residential Treatment Programs °Organization         Address  Phone  Notes  °[facility] [address],    °[place]  1-866-801-8205   °[facility] ° [address], [place], [place] 704-293-8524   °[facility] [address], [place] 336-845-3988 Admissions: 8am-3pm M-F  °[facility] [address]    °[place], [place] 336-841-1104   °[facility] [address], [place], [place] 336-379-7146   °[facility] [address]  °[place], [place] 336-285-9073   °[facility] - Intensive Outpatient [address], [place], [place] 336-852-3033   °[facility] [address]  °[place], [place] 1-877-615-2722 or 336-784-9470   °[facility] [address] [place], [place] 336-227-7417 Accepts Medicaid  °[facility] [address]  °[place] 1-800-659-3381 Substance Abuse/Addiction Treatment  ° °[place] Behavioral Health Resources °Organization         Address  Phone  Notes  °[facility]  (888) 581-9988   °Julie Brannon, PhD [address] [place], [place]   (336) 349-5553 or (336) 951-0000   °[facility]   [address] °[place], [place] (336) 349-4454   °[facility] [address], [place], [place] (336) 342-8316 Insurance/Medicaid/sponsorship through [facility]  °[facility] [address]                                    [place],  (336) 342-8316 Therapy/tele-psych/case  °[facility] [address]  ° [place],   [place] (336) 349-2233    °Dr. Arfeen  (336) 349-4544   °[facility] [place]  [employer] [place] [facility] 1) [address], [place] °2) [address], [place] °3)  [address], [place] (336) 349-3220 °(336) 342-7768 ° °(336) 342-8140   °[place] Child Abuse [facility] (336) 342-1394 or (336) 342-3537 (After Hours)    ° ° ° °

## 2014-02-06 NOTE — ED Provider Notes (Addendum)
CSN: [id number]     Arrival date & time 02/06/14  0554 History   First MD Initiated Contact with Patient 02/06/14 [phone]     Chief Complaint  Patient presents with  . Alcohol Problem  . Medical Clearance     (Consider location/radiation/quality/duration/timing/severity/associated sxs/prior Treatment) HPI Comments: Patient is intoxicated when I go into the room to interview him. He is unable to give any history. Based on nursing notes patient arrived to the emergency room by police officers. He admitted to recent failed attempts of attempting to get sober and also admitted to drinking a pint of rum and 2 4 locos prior to arrival.  Patient apparently denied suicidal or homicidal ideation. He did mention wanting detox at some point.  Patient is a 34 y.o. male presenting with alcohol problem. The history is provided by the police and medical records. The history is limited by the condition of the patient.  Alcohol Problem This is a chronic problem. The problem occurs constantly.    Past Medical History  Diagnosis Date  . Seizures   . Anxiety   . Depression   . Alcohol abuse    Past Surgical History  Procedure Laterality Date  . Wisdom tooth extraction     History reviewed. No pertinent family history. History  Substance Use Topics  . Smoking status: Current Every Day Smoker -- 1.00 packs/day    Types: Cigarettes  . Smokeless tobacco: Not on file  . Alcohol Use: Yes     Comment: 1/5 to 1/2 gallon of liquor daily    Review of Systems  All other systems reviewed and are negative.     Allergies  Review of patient's allergies indicates no known allergies.  Home Medications   Prior to Admission medications   Medication Sig Start Date End Date Taking? Authorizing Provider  acetaminophen (TYLENOL) 325 MG tablet Take 2 tablets (650 mg total) by mouth every 6 (six) hours as needed for moderate pain. Patient not taking: Reported on 02/06/2014 01/10/14   Encarnacion Slates, NP   chlordiazePOXIDE (LIBRIUM) 25 MG capsule Take 2 capsules (50 mg total) by mouth 3 (three) times daily as needed for anxiety. Patient not taking: Reported on 02/06/2014 01/20/14   Linus Mako, PA-C  citalopram (CELEXA) 20 MG tablet Take 1 tablet (20 mg total) by mouth daily. For depression Patient not taking: Reported on 02/06/2014 01/10/14   Encarnacion Slates, NP  LORazepam (ATIVAN) 1 MG tablet Take 1 tablet (1 mg) three times daily x 2 days: #6 tablets Take 1 tablet (1 mg) twice daily x 2 days: #4 tablets Take 1 tablet (1 mg) daily x 2 days: #2 tablets: For alcohol withdrawal symptoms. # of tablets: 12 Patient not taking: Reported on 02/06/2014 01/10/14   Encarnacion Slates, NP  traZODone (DESYREL) 100 MG tablet Take 1 tablet (100 mg total) by mouth at bedtime as needed for sleep. Patient not taking: Reported on 02/06/2014 01/10/14   Encarnacion Slates, NP   BP 115/69 mmHg  Pulse 96  Temp(Src) 97.7 F (36.5 C) (Oral)  Resp 18  SpO2 95% Physical Exam  Constitutional: He appears well-developed and well-nourished. No distress.  HENT:  Head: Normocephalic and atraumatic.  Mouth/Throat: Oropharynx is clear and moist.  No evidence of trauma  Eyes: Conjunctivae and EOM are normal. Pupils are equal, round, and reactive to light.  Neck: Normal range of motion. Neck supple.  Cardiovascular: Normal rate, regular rhythm and intact distal pulses.   No murmur heard.  Pulmonary/Chest: Effort normal and breath sounds normal. No respiratory distress. He has no wheezes. He has no rales.  Abdominal: Soft. He exhibits no distension. There is no tenderness. There is no rebound and no guarding.  Musculoskeletal: Normal range of motion. He exhibits no edema or tenderness.  Neurological:  Patient is sleeping deeply and will only open his eyes briefly to voice and aggressive physical stimulation  Skin: Skin is warm and dry. No rash noted. No erythema.  Psychiatric: He has a normal mood and affect. His behavior is normal.   Nursing note and vitals reviewed.   ED Course  Procedures (including critical care time) Labs Review Labs Reviewed  CBC - Abnormal; Notable for the following:    RBC 4.08 (*)    MCH 34.6 (*)    All other components within normal limits  COMPREHENSIVE METABOLIC PANEL - Abnormal; Notable for the following:    Glucose, Bld 108 (*)    Calcium 8.0 (*)    All other components within normal limits  ETHANOL  URINE RAPID DRUG SCREEN (HOSP PERFORMED)    Imaging Review No results found.   EKG Interpretation None      MDM   Final diagnoses:  Alcohol intoxication, uncomplicated    Patient is too intoxicated to give any history. However based on nursing notes he was brought in by police for intoxication. Apparently he requested detox however patient is obviously intoxicated and has been here multiple times in the past for similar request. Patient will need to sober up and then will reevaluate. He is currently stable there are no signs of trauma he does open his eyes to voice but is unable to speak coherently.  12:20 PM Patient is now awake and sober. Will discharge home with outpatient resources  Blanchie Dessert, MD 02/06/14 [place], MD 02/06/14 1221

## 2014-02-06 NOTE — ED Notes (Signed)
Pt. Use the bathroom in the bed but was given water and his meal tray so he stated that he try use the restroom after drinking his water and eating breakfast.

## 2014-02-06 NOTE — ED Notes (Addendum)
Pt doing ok at transfer, alert oriented x4.reports feeling some better and denies SI/HI

## 2014-02-06 NOTE — ED Notes (Signed)
Arrives to ED via [facility] officers.  He reports that he is ready to "sober up."  Admits to recent failed attempts and says he is scared because he has had seizures when detoxing in the past.  Speech is slurred, unable to focus eyes.

## 2014-02-06 NOTE — ED Notes (Signed)
Pt resting in bed with eyes closed, pt opens eyes with much arousal from the nurse, pt will not answer questions or follow commands at this time. Pt urinated on himself. md made aware rn unable to get urine specimen at this time.

## 2014-02-06 NOTE — ED Notes (Signed)
Pt ambulated to TCU with no difficulty

## 2014-02-06 NOTE — ED Notes (Signed)
Pt resting in bed, denies SI/HI, states he is here for detox, pt is obviously intoxicated, states he drank a pint of rum and 2 four locos. Pt calm and resting at this time.

## 2014-02-13 NOTE — ED Notes (Signed)
rn found pt duffle bag located at old nurse first area where [employer] sits. Unable to contact pt due to no phone number in chart.

## 2014-02-23 NOTE — ED Notes (Signed)
Charge rn called [facility], which is address listed as pts residence and left message asking for staff to call back in relation to a patient matter

## 2014-02-27 NOTE — ED Notes (Signed)
[employer] came to pick up belongings for found property report. Belongings in Evidence at [place].  Case number [id number]  [employer] P. Caffey

## 2014-04-16 ENCOUNTER — Inpatient Hospital Stay (HOSPITAL_COMMUNITY)
Admission: EM | Admit: 2014-04-16 | Discharge: 2014-04-19 | DRG: 897 | Disposition: A | Discharge status: Home / Self Care | Payer: Self-pay | Attending: Internal Medicine | Admitting: Internal Medicine

## 2014-04-16 ENCOUNTER — Encounter (HOSPITAL_COMMUNITY): Payer: Self-pay | Admitting: *Deleted

## 2014-04-16 DIAGNOSIS — G4700 Insomnia, unspecified: Secondary | ICD-10-CM | POA: Diagnosis present

## 2014-04-16 DIAGNOSIS — R000 Tachycardia, unspecified: Secondary | ICD-10-CM | POA: Diagnosis present

## 2014-04-16 DIAGNOSIS — F411 Generalized anxiety disorder: Secondary | ICD-10-CM | POA: Diagnosis present

## 2014-04-16 DIAGNOSIS — F10930 Alcohol use, unspecified with withdrawal, uncomplicated: Secondary | ICD-10-CM

## 2014-04-16 DIAGNOSIS — D649 Anemia, unspecified: Secondary | ICD-10-CM | POA: Diagnosis present

## 2014-04-16 DIAGNOSIS — I471 Supraventricular tachycardia: Secondary | ICD-10-CM

## 2014-04-16 DIAGNOSIS — F1020 Alcohol dependence, uncomplicated: Secondary | ICD-10-CM | POA: Diagnosis present

## 2014-04-16 DIAGNOSIS — F1010 Alcohol abuse, uncomplicated: Secondary | ICD-10-CM | POA: Diagnosis present

## 2014-04-16 DIAGNOSIS — F10939 Alcohol use, unspecified with withdrawal, unspecified: Secondary | ICD-10-CM | POA: Diagnosis present

## 2014-04-16 DIAGNOSIS — R569 Unspecified convulsions: Secondary | ICD-10-CM | POA: Diagnosis present

## 2014-04-16 DIAGNOSIS — F331 Major depressive disorder, recurrent, moderate: Secondary | ICD-10-CM | POA: Diagnosis present

## 2014-04-16 DIAGNOSIS — F10239 Alcohol dependence with withdrawal, unspecified: Secondary | ICD-10-CM | POA: Diagnosis present

## 2014-04-16 DIAGNOSIS — F4310 Post-traumatic stress disorder, unspecified: Secondary | ICD-10-CM | POA: Diagnosis present

## 2014-04-16 DIAGNOSIS — Z9119 Patient's noncompliance with other medical treatment and regimen: Secondary | ICD-10-CM | POA: Diagnosis present

## 2014-04-16 DIAGNOSIS — Z590 Homelessness: Secondary | ICD-10-CM

## 2014-04-16 DIAGNOSIS — Y908 Blood alcohol level of 240 mg/100 ml or more: Secondary | ICD-10-CM | POA: Diagnosis present

## 2014-04-16 DIAGNOSIS — Z720 Tobacco use: Secondary | ICD-10-CM | POA: Diagnosis present

## 2014-04-16 DIAGNOSIS — F10230 Alcohol dependence with withdrawal, uncomplicated: Principal | ICD-10-CM | POA: Diagnosis present

## 2014-04-16 DIAGNOSIS — F17210 Nicotine dependence, cigarettes, uncomplicated: Secondary | ICD-10-CM | POA: Diagnosis present

## 2014-04-16 DIAGNOSIS — F410 Panic disorder [episodic paroxysmal anxiety] without agoraphobia: Secondary | ICD-10-CM | POA: Diagnosis present

## 2014-04-16 LAB — COMPREHENSIVE METABOLIC PANEL
ALBUMIN: 5.1 g/dL (ref 3.5–5.2)
ALK PHOS: 42 U/L (ref 39–117)
ALT: 30 U/L (ref 0–53)
ANION GAP: 15 (ref 5–15)
AST: 32 U/L (ref 0–37)
BILIRUBIN TOTAL: 0.7 mg/dL (ref 0.3–1.2)
BUN: 12 mg/dL (ref 6–23)
CHLORIDE: 96 mmol/L (ref 96–112)
CO2: 27 mmol/L (ref 19–32)
Calcium: 8.5 mg/dL (ref 8.4–10.5)
Creatinine, Ser: 1 mg/dL (ref 0.50–1.35)
GFR calc Af Amer: >90 mL/min (ref >90)
GFR calc non Af Amer: >90 mL/min (ref >90)
Glucose, Bld: 130 mg/dL — ABNORMAL HIGH (ref 70–99)
POTASSIUM: 4 mmol/L (ref 3.5–5.1)
Sodium: 138 mmol/L (ref 135–145)
Total Protein: 8.6 g/dL — ABNORMAL HIGH (ref 6.0–8.3)

## 2014-04-16 LAB — CBC WITH DIFFERENTIAL/PLATELET
Basophils Absolute: 0 10*3/uL (ref 0.0–0.1)
Basophils Relative: 0 % (ref 0–1)
EOS ABS: 0.1 10*3/uL (ref 0.0–0.7)
Eosinophils Relative: 1 % (ref 0–5)
HCT: 49.3 % (ref 39.0–52.0)
Hemoglobin: 17.4 g/dL — ABNORMAL HIGH (ref 13.0–17.0)
LYMPHS ABS: 6.2 10*3/uL — AB (ref 0.7–4.0)
LYMPHS PCT: 51 % — AB (ref 12–46)
MCH: 33.5 pg (ref 26.0–34.0)
MCHC: 35.3 g/dL (ref 30.0–36.0)
MCV: 94.8 fL (ref 78.0–100.0)
MONO ABS: 1 10*3/uL (ref 0.1–1.0)
MONOS PCT: 8 % (ref 3–12)
Neutro Abs: 4.9 10*3/uL (ref 1.7–7.7)
Neutrophils Relative %: 40 % — ABNORMAL LOW (ref 43–77)
PLATELETS: 338 10*3/uL (ref 150–400)
RBC: 5.2 MIL/uL (ref 4.22–5.81)
RDW: 12.1 % (ref 11.5–15.5)
WBC: 12.2 10*3/uL — AB (ref 4.0–10.5)

## 2014-04-16 LAB — URINALYSIS, ROUTINE W REFLEX MICROSCOPIC
BILIRUBIN URINE: NEGATIVE
Glucose, UA: NEGATIVE mg/dL
Hgb urine dipstick: NEGATIVE
KETONES UR: NEGATIVE mg/dL
LEUKOCYTES UA: NEGATIVE
NITRITE: NEGATIVE
Protein, ur: NEGATIVE mg/dL
Specific Gravity, Urine: 1.023 (ref 1.005–1.030)
UROBILINOGEN UA: 0.2 mg/dL (ref 0.0–1.0)
pH: 5.5 (ref 5.0–8.0)

## 2014-04-16 LAB — PATHOLOGIST SMEAR REVIEW

## 2014-04-16 LAB — LIPASE, BLOOD: LIPASE: 21 U/L (ref 11–59)

## 2014-04-16 LAB — RAPID URINE DRUG SCREEN, HOSP PERFORMED
AMPHETAMINES: NOT DETECTED
Barbiturates: NOT DETECTED
Benzodiazepines: NOT DETECTED
Cocaine: NOT DETECTED
OPIATES: NOT DETECTED
Tetrahydrocannabinol: NOT DETECTED

## 2014-04-16 LAB — ETHANOL: Alcohol, Ethyl (B): 263 mg/dL — ABNORMAL HIGH (ref 0–9)

## 2014-04-16 LAB — PROTIME-INR
INR: 1.16 (ref 0.00–1.49)
Prothrombin Time: 15 seconds (ref 11.6–15.2)

## 2014-04-16 MED ORDER — SODIUM CHLORIDE 0.9 % IV BOLUS (SEPSIS)
1000.0000 mL | Freq: Once | INTRAVENOUS | Status: AC
  Administered 2014-04-16: 1000 mL via INTRAVENOUS

## 2014-04-16 MED ORDER — ADULT MULTIVITAMIN W/MINERALS CH
1.0000 | ORAL_TABLET | Freq: Every day | ORAL | Status: DC
  Administered 2014-04-16 – 2014-04-19 (×4): 1 via ORAL
  Filled 2014-04-16 (×4): qty 1

## 2014-04-16 MED ORDER — VITAMIN B-1 100 MG PO TABS
100.0000 mg | ORAL_TABLET | Freq: Every day | ORAL | Status: DC
  Administered 2014-04-17 – 2014-04-19 (×3): 100 mg via ORAL
  Filled 2014-04-16 (×3): qty 1

## 2014-04-16 MED ORDER — VITAMIN B-1 100 MG PO TABS: 100.0000 mg | ORAL_TABLET | Freq: Every day | ORAL | Status: DC

## 2014-04-16 MED ORDER — LORAZEPAM 1 MG PO TABS
0.0000 mg | ORAL_TABLET | Freq: Two times a day (BID) | ORAL | Status: DC
  Administered 2014-04-19: 4 mg via ORAL
  Filled 2014-04-16: qty 4

## 2014-04-16 MED ORDER — ONDANSETRON HCL 4 MG PO TABS: 4.0000 mg | ORAL_TABLET | Freq: Four times a day (QID) | ORAL | Status: DC | PRN

## 2014-04-16 MED ORDER — LORAZEPAM 2 MG/ML IJ SOLN
0.0000 mg | Freq: Two times a day (BID) | INTRAMUSCULAR | Status: DC
  Administered 2014-04-16: 2 mg via INTRAVENOUS
  Filled 2014-04-16: qty 1

## 2014-04-16 MED ORDER — THIAMINE HCL 100 MG/ML IJ SOLN
100.0000 mg | Freq: Every day | INTRAMUSCULAR | Status: DC
  Filled 2014-04-16 (×3): qty 1

## 2014-04-16 MED ORDER — ENOXAPARIN SODIUM 40 MG/0.4ML ~~LOC~~ SOLN
40.0000 mg | SUBCUTANEOUS | Status: DC
  Administered 2014-04-16 – 2014-04-18 (×3): 40 mg via SUBCUTANEOUS
  Filled 2014-04-16 (×4): qty 0.4

## 2014-04-16 MED ORDER — ACETAMINOPHEN 325 MG PO TABS
650.0000 mg | ORAL_TABLET | Freq: Four times a day (QID) | ORAL | Status: DC | PRN
  Administered 2014-04-18: 650 mg via ORAL
  Filled 2014-04-16: qty 2

## 2014-04-16 MED ORDER — LORAZEPAM 2 MG/ML IJ SOLN
0.0000 mg | Freq: Four times a day (QID) | INTRAMUSCULAR | Status: DC
  Administered 2014-04-16: 2 mg via INTRAVENOUS
  Filled 2014-04-16: qty 1

## 2014-04-16 MED ORDER — LORAZEPAM 1 MG PO TABS
0.0000 mg | ORAL_TABLET | Freq: Four times a day (QID) | ORAL | Status: AC
Start: 2014-04-16 — End: 2014-04-18
  Administered 2014-04-16: 2 mg via ORAL
  Administered 2014-04-16 – 2014-04-18 (×5): 4 mg via ORAL
  Filled 2014-04-16 (×4): qty 4
  Filled 2014-04-16: qty 2
  Filled 2014-04-16: qty 4

## 2014-04-16 MED ORDER — THIAMINE HCL 100 MG/ML IJ SOLN
100.0000 mg | Freq: Every day | INTRAMUSCULAR | Status: DC
  Administered 2014-04-16: 100 mg via INTRAVENOUS
  Filled 2014-04-16: qty 2

## 2014-04-16 MED ORDER — SODIUM CHLORIDE 0.9 % IJ SOLN
3.0000 mL | Freq: Two times a day (BID) | INTRAMUSCULAR | Status: DC
  Administered 2014-04-19: 3 mL via INTRAVENOUS

## 2014-04-16 MED ORDER — FOLIC ACID 1 MG PO TABS
1.0000 mg | ORAL_TABLET | Freq: Every day | ORAL | Status: DC
  Administered 2014-04-16 – 2014-04-19 (×4): 1 mg via ORAL
  Filled 2014-04-16 (×4): qty 1

## 2014-04-16 MED ORDER — SODIUM CHLORIDE 0.9 % IV SOLN
INTRAVENOUS | Status: AC
  Administered 2014-04-16 – 2014-04-17 (×2): 125 mL/h via INTRAVENOUS

## 2014-04-16 MED ORDER — ACETAMINOPHEN 650 MG RE SUPP: 650.0000 mg | Freq: Four times a day (QID) | RECTAL | Status: DC | PRN

## 2014-04-16 MED ORDER — LORAZEPAM 2 MG/ML IJ SOLN
1.0000 mg | Freq: Four times a day (QID) | INTRAMUSCULAR | Status: DC | PRN
  Administered 2014-04-16 – 2014-04-18 (×4): 1 mg via INTRAVENOUS
  Filled 2014-04-16 (×5): qty 1

## 2014-04-16 MED ORDER — ALBUTEROL SULFATE (2.5 MG/3ML) 0.083% IN NEBU: 2.5000 mg | INHALATION_SOLUTION | RESPIRATORY_TRACT | Status: DC | PRN

## 2014-04-16 MED ORDER — LORAZEPAM 1 MG PO TABS: 0.0000 mg | ORAL_TABLET | Freq: Two times a day (BID) | ORAL | Status: DC

## 2014-04-16 MED ORDER — LORAZEPAM 1 MG PO TABS
1.0000 mg | ORAL_TABLET | Freq: Four times a day (QID) | ORAL | Status: DC | PRN
Start: 2014-04-16 — End: 2014-04-19
  Administered 2014-04-17 – 2014-04-19 (×4): 1 mg via ORAL
  Filled 2014-04-16 (×3): qty 1

## 2014-04-16 MED ORDER — LORAZEPAM 1 MG PO TABS: 0.0000 mg | ORAL_TABLET | Freq: Four times a day (QID) | ORAL | Status: DC

## 2014-04-16 MED ORDER — ONDANSETRON HCL 4 MG/2ML IJ SOLN
4.0000 mg | Freq: Four times a day (QID) | INTRAMUSCULAR | Status: DC | PRN
  Administered 2014-04-17 – 2014-04-18 (×2): 4 mg via INTRAVENOUS
  Filled 2014-04-16 (×2): qty 2

## 2014-04-16 MED ORDER — PANTOPRAZOLE SODIUM 40 MG PO TBEC
40.0000 mg | DELAYED_RELEASE_TABLET | Freq: Every day | ORAL | Status: DC
  Administered 2014-04-16 – 2014-04-19 (×4): 40 mg via ORAL
  Filled 2014-04-16 (×4): qty 1

## 2014-04-16 MED ORDER — LORAZEPAM 2 MG/ML IJ SOLN: 1.0000 mg | Freq: Once | INTRAMUSCULAR | Status: DC

## 2014-04-16 MED ORDER — NICOTINE 21 MG/24HR TD PT24
21.0000 mg | MEDICATED_PATCH | Freq: Every day | TRANSDERMAL | Status: DC
  Administered 2014-04-16 – 2014-04-18 (×3): 21 mg via TRANSDERMAL
  Filled 2014-04-16 (×4): qty 1

## 2014-04-16 MED ORDER — ALUM & MAG HYDROXIDE-SIMETH 200-200-20 MG/5ML PO SUSP: 30.0000 mL | Freq: Four times a day (QID) | ORAL | Status: DC | PRN

## 2014-04-16 NOTE — Progress Notes (Signed)
UR completed 

## 2014-04-16 NOTE — Progress Notes (Signed)
  CARE MANAGEMENT ED NOTE 04/16/2014  Patient:  TAEDYN, GLASSCOCK   Account Number:  [id number]  Date Initiated:  04/16/2014  Documentation initiated by:  Jackelyn Poling  Subjective/Objective Assessment:   34 yr old self pay [place] pt Requesting ETOH detox. Last drink around 0300, normally drinks 1/2 gallon per day. Pt has nausea and vomiting, body aches 8/10.          Subjective/Objective Assessment Detail:   Pt confirms he is homeless States he last stayed with "a male but it did not work out"   Pt without pcp.  States he did not follow up on any resources provided by this ED CM in February 2016.  Pt states he previously had medicaid and is "probably certain it is not active.  Confirms he went to [facility] previously and states "Dr Sabra Heck told me he could keep me longer the next time."  Pt stating he wants to go to "Behavior health"  "I  know I have PTSD"  Pt reports no n compliance with his medications because "they did not make me feel good"     Action/Plan:   CM spoke with pt about self pay pcps, medication at a discount, importance of pcp, psychiatrist and/or counselor to monitor [facility] meds and services Encouraged compliance to medications & reporting what makes him not "feel good" to providers   Action/Plan Detail:   Anticipated DC Date:  04/19/2014     Status Recommendation to Physician:   Result of Recommendation:    Other ED Services  Consult [facility] [place]  Other  Outpatient Services - Pt will follow up  PCP issues    Choice offered to / List presented to:            Status of service:  Completed, signed off  ED Comments:   ED Comments Detail:  CM spoke with pt who confirms self pay [facility] resident with no pcp. CM discussed and provided written information for self pay pcps, importance of pcp for f/u care, www.needymeds.org, www.goodrx.com, discounted pharmacies and other [employer] such as [employer], affordable care  act,  financial assistance, DSS and  health department  Reviewed resources for [employer] self pay pcps like Jinny Blossom, family medicine at [address], [facility] family practice, general medical clinics, [facility] urgent care plus others, medication resources, [facility] out patient pharmacies and housing Pt voiced understanding and appreciation of resources provided  Provided [facility] contact information agree to referral. CM completed referral

## 2014-04-16 NOTE — BH Assessment (Signed)
Writer called ED to put the tele assessment machine in pt's room. Writer spoke with MD about patient.   Ruben Reason, MA, LPCA, [facility]

## 2014-04-16 NOTE — Progress Notes (Signed)
Patient admitted to [facility], transferred from ED, alert and orientation in question due to heavy ETOH Abuse, transferred on hospital bed, tolerated well, resting in bed peacefully with eyes closed, response to verbal commands, BP= 104/63, P= 123, Temp= 97.9, Sats= 95% on RA, CIWA score at this time 11, patient has no concerns at this time, will continue to monitor patient on telemetry and for the remainder of shift, patient in stable condition at this time

## 2014-04-16 NOTE — BH Assessment (Addendum)
Tele Assessment Note   Chad Morgan is an 34 y.o. male. Pt arrived to [facility] voluntarily stating that he needed alcohol detox. Pt denied SI/HI. Pt denied delusions and hallucinations. Pt states that he has been an alcoholic since 34 years old. Pt reports that he drinks a half gallon of liquor a day. Pt states that he is currently experiencing withdrawal symptoms. According to the Pt, he is experiencing nausea, dirreahea, sweats, and tremors. The Pt states that he also has seizures. Pt reports that he had a seizure a year ago. Pt states that he has received inpatient treatment but he cannot recall the facilities. Pt reports homelessness. Pt states that he is seeking detox and then long-term substance abuse care. Pt denied current outpatient treatment.  Writer consulted with Chad Agent, NP. Per Chad Morgan Pt does not meet inpatient criteria. Dr. Sharol Morgan has admitted the Pt to the medical floor because of severity of withdrawal symptoms.   Axis I: Alcohol Abuse Axis II: Deferred Axis III:  Past Medical History  Diagnosis Date  . Seizures   . Anxiety   . Depression   . Alcohol abuse    Axis IV: educational problems, other psychosocial or environmental problems, problems with access to health care services and problems with primary support group Axis V: 41-50 serious symptoms  Past Medical History:  Past Medical History  Diagnosis Date  . Seizures   . Anxiety   . Depression   . Alcohol abuse     Past Surgical History  Procedure Laterality Date  . Wisdom tooth extraction      Family History:  Family History  Problem Relation Age of Onset  . Alcoholism Father     Social History:  reports that he has been smoking Cigarettes.  He has a 16 pack-year smoking history. He has never used smokeless tobacco. He reports that he drinks alcohol. He reports that he does not use illicit drugs.  Additional Social History:  Alcohol / Drug Use Pain Medications: Pt denied Prescriptions: Pt  denied Over the Counter: Pt denied History of alcohol / drug use?: Yes Longest period of sobriety (when/how long): Pt could not recall Negative Consequences of Use: Financial, Legal, Personal relationships, Work / School Withdrawal Symptoms: Agitation, Tremors, Weakness, Delirium, DTs, Sweats Substance #1 Name of Substance 1: Alcohol 1 - Age of First Use: 17 1 - Amount (size/oz): Half gallon 1 - Frequency: daily 1 - Duration: ongoing 1 - Last Use / Amount: 04/16/14  CIWA: CIWA-Ar BP: (!) 114/44 mmHg Pulse Rate: (!) 123 Nausea and Vomiting: 2 Tactile Disturbances: none Tremor: moderate, with patient's arms extended Auditory Disturbances: not present Paroxysmal Sweats: no sweat visible Visual Disturbances: not present Anxiety: three Headache, Fullness in Head: mild Agitation: normal activity Orientation and Clouding of Sensorium: oriented and can do serial additions CIWA-Ar Total: 11 COWS:    PATIENT STRENGTHS: (choose at least two) Communication skills Motivation for treatment/growth  Allergies: No Known Allergies  Home Medications:  Medications Prior to Admission  Medication Sig Dispense Refill  . [DISCONTINUED] acetaminophen (TYLENOL) 325 MG tablet Take 2 tablets (650 mg total) by mouth every 6 (six) hours as needed for moderate pain. (Patient not taking: Reported on 02/06/2014)    . [DISCONTINUED] chlordiazePOXIDE (LIBRIUM) 25 MG capsule Take 2 capsules (50 mg total) by mouth 3 (three) times daily as needed for anxiety. (Patient not taking: Reported on 02/06/2014) 24 capsule 0  . [DISCONTINUED] citalopram (CELEXA) 20 MG tablet Take 1 tablet (20 mg total) by mouth daily.  For depression (Patient not taking: Reported on 02/06/2014) 30 tablet 0  . [DISCONTINUED] LORazepam (ATIVAN) 1 MG tablet Take 1 tablet (1 mg) three times daily x 2 days: #6 tablets Take 1 tablet (1 mg) twice daily x 2 days: #4 tablets Take 1 tablet (1 mg) daily x 2 days: #2 tablets: For alcohol withdrawal  symptoms. # of tablets: 12 (Patient not taking: Reported on 02/06/2014) 12 tablet 0  . [DISCONTINUED] traZODone (DESYREL) 100 MG tablet Take 1 tablet (100 mg total) by mouth at bedtime as needed for sleep. (Patient not taking: Reported on 02/06/2014) 30 tablet 0    OB/GYN Status:  No LMP for male patient.  General Assessment Data Location of Assessment: [facility] Assessment Services Is this a Tele or Face-to-Face Assessment?: Tele Assessment Is this an Initial Assessment or a Re-assessment for this encounter?: Initial Assessment Living Arrangements: Alone, Other (Comment) Can pt return to current living arrangement?: Yes Admission Status: Voluntary Is patient capable of signing voluntary admission?: Yes Transfer from: Home Referral Source: Other     [facility] Crisis Care Plan Living Arrangements: Alone, Other (Comment) Name of Psychiatrist: None Name of Therapist: None  Education Status Is patient currently in school?: Yes Current Grade: NA Highest grade of school patient has completed: 12 Name of school: NA Contact person: NA  Risk to self with the past 6 months Suicidal Ideation: No Suicidal Intent: No Is patient at risk for suicide?: No Suicidal Plan?: No Access to Means: No What has been your use of drugs/alcohol within the last 12 months?: NA Previous Attempts/Gestures: No How many times?: 0 Other Self Harm Risks: NA Triggers for Past Attempts: None known Intentional Self Injurious Behavior: None Family Suicide History: No Recent stressful life event(s): Legal Issues (Child support) Persecutory voices/beliefs?: No Depression: No Depression Symptoms:  (Reports none) Substance abuse history and/or treatment for substance abuse?: No Suicide prevention information Morgan to non-admitted patients: Not applicable  Risk to Others within the past 6 months Homicidal Ideation: No Thoughts of Harm to Others: No Current Homicidal Intent: No Current Homicidal Plan: No Access to  Homicidal Means: No Identified Victim: NA History of harm to others?: No Assessment of Violence: None Noted Violent Behavior Description: NA Does patient have access to weapons?: No Criminal Charges Pending?: No Does patient have a court date: No  Psychosis Hallucinations: None noted Delusions: None noted  Mental Status Report Appearance/Hygiene: Disheveled Eye Contact: Poor Motor Activity: Freedom of movement Speech: Logical/coherent Level of Consciousness: Alert Mood: Euthymic Affect: Appropriate to circumstance Anxiety Level: Minimal Thought Processes: Coherent, Relevant Judgement: Unimpaired Orientation: Person, Place, Time, Situation, Appropriate for developmental age Obsessive Compulsive Thoughts/Behaviors: None  Cognitive Functioning Concentration: Normal Memory: Recent Intact, Remote Intact IQ: Average Insight: Fair Impulse Control: Fair Appetite: Fair Weight Loss: 0 Weight Gain: 0 Sleep: Decreased Total Hours of Sleep: 5 Vegetative Symptoms: None  ADLScreening [facility] Assessment Services) Patient's cognitive ability adequate to safely complete daily activities?: Yes Patient able to express need for assistance with ADLs?: No Independently performs ADLs?: Yes (appropriate for developmental age)  Prior Inpatient Therapy Prior Inpatient Therapy: Yes Prior Therapy Dates: 2016 Prior Therapy Facilty/Provider(s):  Pt could not recall Reason for Treatment: Alcohol abuse  Prior Outpatient Therapy Prior Outpatient Therapy: No Prior Therapy Dates: Na Prior Therapy Facilty/Provider(s): NA Reason for Treatment: NA  ADL Screening (condition at time of admission) Patient's cognitive ability adequate to safely complete daily activities?: Yes Is the patient deaf or have difficulty hearing?: No Does the patient have difficulty seeing,  even when wearing glasses/contacts?: No Does the patient have difficulty concentrating, remembering, or making decisions?: No Patient  able to express need for assistance with ADLs?: No Does the patient have difficulty dressing or bathing?: No Independently performs ADLs?: Yes (appropriate for developmental age) Does the patient have difficulty walking or climbing stairs?: No Weakness of Legs: None Weakness of Arms/Hands: None  Home Assistive Devices/Equipment Home Assistive Devices/Equipment: None  Therapy Consults (therapy consults require a physician order) PT Evaluation Needed: No OT Evalulation Needed: No SLP Evaluation Needed: No Abuse/Neglect Assessment (Assessment to be complete while patient is alone) Physical Abuse: Denies Verbal Abuse: Denies Sexual Abuse: Denies Exploitation of patient/patient's resources: Denies Self-Neglect: Denies Values / Beliefs Cultural Requests During Hospitalization: None Spiritual Requests During Hospitalization: None Consults Spiritual Care Consult Needed: No Social Work Consult Needed: No Regulatory affairs officer (For Healthcare) Does patient have an advance directive?: No Would patient like information on creating an advanced directive?: No - patient declined information Nutrition Screen- [facility] Adult/WL/AP Patient's home diet: Regular Has the patient recently lost weight without trying?: No Has the patient been eating poorly because of a decreased appetite?: Yes Malnutrition Screening Tool Score: 1  Additional Information 1:1 In Past 12 Months?: No CIRT Risk: No Elopement Risk: No Does patient have medical clearance?: No     Disposition:  Disposition Initial Assessment Completed for this Encounter: Yes Disposition of Patient: Other dispositions Other disposition(s): Other (Comment) (EDP moving Pt to medical floor)  Javaris Wigington D 04/16/2014 2:22 PM

## 2014-04-16 NOTE — BH Assessment (Signed)
Writer attempted to call tele assessment machine twice and was unable to reach it. Writer called the ED and requested that they try to call the machine at [place], MA, LPCA, [place] Triage Specialist [facility]

## 2014-04-16 NOTE — ED Notes (Signed)
Pt reports he is homeless, denies SI/HI, AH/VH. Requesting ETOH detox. Last drink around 0300, normally drinks 1/2 gallon per day. Pt has nausea and vomiting, body aches 8/10.

## 2014-04-16 NOTE — ED Notes (Signed)
Called floor to give report. RN unavailable at this time.  Awaiting call back

## 2014-04-16 NOTE — ED Notes (Signed)
MD at bedside. 

## 2014-04-16 NOTE — ED Provider Notes (Signed)
CSN: [id number]     Arrival date & time 04/16/14  0846 History   First MD Initiated Contact with Patient 04/16/14 [phone]     Chief Complaint  Patient presents with  . ETOH detox      (Consider location/radiation/quality/duration/timing/severity/associated sxs/prior Treatment) HPI 34 year old male presents to emergency department with complaint of alcohol withdrawal.  Patient is wishing help with his detox process.  Patient's last drink around 3 AM.  Patient drinks half gallon to gallon a day.  He reports prior history of alcohol withdrawal seizures.  Patient last had detox in about 4 months ago.  He reports relapsing soon after being discharged.  His longest of time of sobriety was about 6 months.  Patient reports that he has been a heavy drinker since age 47.  Prior records reviewed, this is his fourth visit for alcohol-related complaints.  In the past he has an given Ativan or Librium and outpatient resources.  He reports that he has been unable to follow with outside programs. Past Medical History  Diagnosis Date  . Seizures   . Anxiety   . Depression   . Alcohol abuse    Past Surgical History  Procedure Laterality Date  . Wisdom tooth extraction     History reviewed. No pertinent family history. History  Substance Use Topics  . Smoking status: Current Every Day Smoker -- 1.00 packs/day    Types: Cigarettes  . Smokeless tobacco: Not on file  . Alcohol Use: Yes     Comment: 1/5 to 1/2 gallon of liquor daily    Review of Systems   See History of Present Illness; otherwise all other systems are reviewed and negative  Allergies  Review of patient's allergies indicates no known allergies.  Home Medications   Prior to Admission medications   Medication Sig Start Date End Date Taking? Authorizing Provider  acetaminophen (TYLENOL) 325 MG tablet Take 2 tablets (650 mg total) by mouth every 6 (six) hours as needed for moderate pain. Patient not taking: Reported on 02/06/2014 01/10/14    Encarnacion Slates, NP  chlordiazePOXIDE (LIBRIUM) 25 MG capsule Take 2 capsules (50 mg total) by mouth 3 (three) times daily as needed for anxiety. Patient not taking: Reported on 02/06/2014 01/20/14   Delos Haring, PA-C  citalopram (CELEXA) 20 MG tablet Take 1 tablet (20 mg total) by mouth daily. For depression Patient not taking: Reported on 02/06/2014 01/10/14   Encarnacion Slates, NP  LORazepam (ATIVAN) 1 MG tablet Take 1 tablet (1 mg) three times daily x 2 days: #6 tablets Take 1 tablet (1 mg) twice daily x 2 days: #4 tablets Take 1 tablet (1 mg) daily x 2 days: #2 tablets: For alcohol withdrawal symptoms. # of tablets: 12 Patient not taking: Reported on 02/06/2014 01/10/14   Encarnacion Slates, NP  traZODone (DESYREL) 100 MG tablet Take 1 tablet (100 mg total) by mouth at bedtime as needed for sleep. Patient not taking: Reported on 02/06/2014 01/10/14   Encarnacion Slates, NP   BP 130/70 mmHg  Pulse 140  Temp(Src) 97.8 F (36.6 C) (Oral)  Resp 13  SpO2 98% Physical Exam  Constitutional: He is oriented to person, place, and time. He appears well-developed and well-nourished.  HENT:  Head: Normocephalic and atraumatic.  Nose: Nose normal.  Mouth/Throat: Oropharynx is clear and moist.  Eyes: Conjunctivae and EOM are normal. Pupils are equal, round, and reactive to light.  Neck: Normal range of motion. Neck supple. No JVD present. No tracheal deviation  present. No thyromegaly present.  Cardiovascular: Normal rate, regular rhythm, normal heart sounds and intact distal pulses.  Exam reveals no gallop and no friction rub.   No murmur heard. Tachycardia noted  Pulmonary/Chest: Effort normal and breath sounds normal. No stridor. No respiratory distress. He has no wheezes. He has no rales. He exhibits no tenderness.  Abdominal: Soft. Bowel sounds are normal. He exhibits no distension and no mass. There is no tenderness. There is no rebound and no guarding.  Musculoskeletal: Normal range of motion. He exhibits no edema  or tenderness.  Lymphadenopathy:    He has no cervical adenopathy.  Neurological: He is alert and oriented to person, place, and time. He displays normal reflexes. No cranial nerve deficit. He exhibits normal muscle tone. Coordination normal.  No tremor noted at this time  Skin: Skin is warm. No rash noted. He is diaphoretic. No erythema. No pallor.  Psychiatric: He has a normal mood and affect. His behavior is normal. Judgment and thought content normal.  Nursing note and vitals reviewed.   ED Course  Procedures (including critical care time) Labs Review Labs Reviewed  COMPREHENSIVE METABOLIC PANEL  ETHANOL  CBC WITH DIFFERENTIAL/PLATELET  LIPASE, BLOOD  URINALYSIS, ROUTINE W REFLEX MICROSCOPIC  URINE RAPID DRUG SCREEN (HOSP PERFORMED)    Imaging Review No results found.   EKG Interpretation   Date/Time:  Monday April 16 2014 09:02:38 EDT Ventricular Rate:  138 PR Interval:  108 QRS Duration: 87 QT Interval:  300 QTC Calculation: 454 R Axis:   84 Text Interpretation:  Sinus tachycardia Borderline T abnormalities,  lateral leads Baseline wander in lead(s) V3 No old tracing to compare  Confirmed by Donte Kary  MD, Gevork Ayyad ([id number]) on 04/16/2014 9:10:59 AM      CRITICAL CARE Performed by: Kalman Drape Total critical care time: 60 min Critical care time was exclusive of separately billable procedures and treating other patients. Critical care was necessary to treat or prevent imminent or life-threatening deterioration. Critical care was time spent personally by me on the following activities: development of treatment plan with patient and/or surrogate as well as nursing, discussions with consultants, evaluation of patient's response to treatment, examination of patient, obtaining history from patient or surrogate, ordering and performing treatments and interventions, ordering and review of laboratory studies, ordering and review of radiographic studies, pulse oximetry and  re-evaluation of patient's condition.  MDM   Final diagnoses:  Alcohol withdrawal, uncomplicated    34 year old male with reported alcohol withdrawal.  Patient is significantly tachycardic.  Patient is mentating normally at this time.  He reports a history of alcohol withdrawal seizures.  Plan for labs, iv fluid hydration, Ativan.  12:34 PM Patient with persistent tachycardia after 2 L of fluid.  He was on the alcohol withdrawal protocol and receiving Ativan IV.  Patient does not meet criteria for Behavioral Health admission secondary to no dual diagnosis.  Patient does not meet criteria for inpatient stays with archive due to history of alcohol withdrawal seizures.  I'm concerned for outpatient management of his alcohol withdrawal.  Given prior history of failure, and history of alcohol withdrawal seizures.  Will discuss with hospitalist for admission.  Social work will see the patient for outpatient rehabilitation programs once he is medically discharged.  Linton Flemings, MD 04/16/14 1240

## 2014-04-16 NOTE — BH Assessment (Signed)
After attempting to call the [facility] tele assessment machine again, writer called Chad Morgan who decided she would see the patient.  Ruben Reason, MA, LPCA, [facility]

## 2014-04-16 NOTE — H&P (Addendum)
History and Physical  Chad Morgan QTM:[id number] DOB: [birth year] DOA: 04/16/2014  Referring physician: Dr. Linton Flemings, [place] PCP: No PCP Per Patient  Outpatient Specialists:  1. None  Chief Complaint: Wanting alcohol detox.  HPI: Chad Morgan is a 34 y.o. male, separated, unemployed, ?homeless, PMH of alcohol dependance, tobacco abuse, PTSD, depression & anxiety presented to the [facility] ED on 04/16/2014 seeking detox from alcohol. Apart from nausea, he denies complaints. He denies vomiting, abdominal pain, diarrhea, dizziness or lightheadedness. NO CP or SOB reported. He states that he was sober for awhile but relapsed in the company of a girlfriend who was abusing alcohol. He was hospitalized to [facility] in January for alcohol detox. Post discharge, he apparently lost his prescriptions for psych meds after 5 days. He indicates that he is frustrated and tired from his alcohol and tobacco dependence and wants to quit. He states that he has PTSD from the [employer] which has been untreated. He feels constantly anxious and depressed but denies suicidal or homicidal ideations or auditory or visual hallucinations. In the ED, patient was found to be tachycardic, lab work unremarkable except blood alcohol level 263. Urine microscopy and UDS: Negative. He was bolused with 2 L of IV fluids and hospitalist admission requested.   Review of Systems: All systems reviewed and apart from history of presenting illness, are negative.  Past Medical History  Diagnosis Date  . Seizures   . Anxiety   . Depression   . Alcohol abuse    Past Surgical History  Procedure Laterality Date  . Wisdom tooth extraction     Social History:  reports that he has been smoking Cigarettes.  He has a 16 pack-year smoking history. He has never used smokeless tobacco. He reports that he drinks alcohol. He reports that he does not use illicit drugs. Separated.? Homeless. Independent of activities of daily living.  Has been smoking a pack of cigarettes per day for the last 16 years. He states that he drinks half a gallon of liquor daily for the last 5 years. Denies any other substance abuse.  No Known Allergies  Family History  Problem Relation Age of Onset  . Alcoholism Father    patient's uncle also has history of alcoholism.   Prior to Admission medications   Not on File   Physical Exam: Filed Vitals:   04/16/14 1215 04/16/14 1300 04/16/14 1330 04/16/14 1408  BP:  104/64 104/63 114/44  Pulse: 122 122 129 123  Temp:    97.9 F (36.6 C)  TempSrc:    Oral  Resp: 20 20 18 20   SpO2: 93% 96% 93% 95%     General exam: Moderately built and nourished pleasant young male patient, lying comfortably supine on the gurney in no obvious distress.  Head, eyes and ENT: Nontraumatic and normocephalic. Pupils equally reacting to light and accommodation. Oral mucosa borderline hydration.  Neck: Supple. No JVD, carotid bruit or thyromegaly.  Lymphatics: No lymphadenopathy.  Respiratory system: Clear to auscultation. No increased work of breathing.  Cardiovascular system: S1 and S2 heard, regular tachycardic. No JVD, murmurs, gallops, clicks or pedal edema.  Gastrointestinal system: Abdomen is nondistended, soft and nontender. Normal bowel sounds heard. No organomegaly or masses appreciated.  Central nervous system: Alert and oriented. No focal neurological deficits.  Extremities: Symmetric 5 x 5 power. Peripheral pulses symmetrically felt. No tremulousness.  Skin: No rashes or acute findings.  Musculoskeletal system: Negative exam.  Psychiatry: Pleasant and cooperative.  Labs on Admission:  Basic Metabolic Panel:  Recent Labs Lab 04/16/14 0932  NA 138  K 4.0  CL 96  CO2 27  GLUCOSE 130*  BUN 12  CREATININE 1.00  CALCIUM 8.5   Liver Function Tests:  Recent Labs Lab 04/16/14 0932  AST 32  ALT 30  ALKPHOS 42  BILITOT 0.7  PROT 8.6*  ALBUMIN 5.1    Recent Labs Lab  04/16/14 0932  LIPASE 21   No results for input(s): AMMONIA in the last 168 hours. CBC:  Recent Labs Lab 04/16/14 0932  WBC 12.2*  NEUTROABS 4.9  HGB 17.4*  HCT 49.3  MCV 94.8  PLT 338   Cardiac Enzymes: No results for input(s): CKTOTAL, CKMB, CKMBINDEX, TROPONINI in the last 168 hours.  BNP (last 3 results) No results for input(s): PROBNP in the last 8760 hours. CBG: No results for input(s): GLUCAP in the last 168 hours.  Radiological Exams on Admission: No results found.  EKG: Independently reviewed. Sinus tachycardia at 1 38 bpm, normal axis and no acute changes. QTC 454 ms.  Assessment/Plan Principal Problem:   Alcohol dependence with uncomplicated withdrawal - At risk for severe withdrawal/DTs - Admit to telemetry - Start CIWA protocol including scheduled Ativan. - Psychiatry consulted.  Active Problems:   Alcohol dependence - Management as above.    Tobacco abuse - Cessation counseled. - Nicotine patch    Major depressive disorder, recurrent episode, moderate - Patient denies SI/HI. - States that the medications he was briefly on post discharge from [facility] in January helped but he lost the medications. - Psychiatry consulted    Panic attacks/ GAD (generalized anxiety disorder) - Psychiatry consulted    Tachycardia - Asymptomatic - Secondary to alcohol withdrawal - Management as above    Nausea - Secondary to alcohol withdrawal - PPI    ? Homelessness - Clinical social worker consulted - Case management also consulted for outpatient PCP follow-up and medication assistance.    Code Status: Full  Family Communication: None at bedside  Disposition Plan: DC home when medically stable- possibly in 3-4 days.   Time spent: 60 minutes.  Vernell Leep, MD, FACP, FHM. [facility] Pager [phone]  If 7PM-7AM, please contact night-coverage [email or web address] Password [email or web address] 04/16/2014, 2:15 PM

## 2014-04-17 DIAGNOSIS — F411 Generalized anxiety disorder: Secondary | ICD-10-CM

## 2014-04-17 LAB — CBC
HEMATOCRIT: 36.8 % — AB (ref 39.0–52.0)
Hemoglobin: 12.8 g/dL — ABNORMAL LOW (ref 13.0–17.0)
MCH: 33.2 pg (ref 26.0–34.0)
MCHC: 34.8 g/dL (ref 30.0–36.0)
MCV: 95.6 fL (ref 78.0–100.0)
Platelets: 239 10*3/uL (ref 150–400)
RBC: 3.85 MIL/uL — ABNORMAL LOW (ref 4.22–5.81)
RDW: 12 % (ref 11.5–15.5)
WBC: 9 10*3/uL (ref 4.0–10.5)

## 2014-04-17 LAB — COMPREHENSIVE METABOLIC PANEL
ALT: 23 U/L (ref 0–53)
AST: 21 U/L (ref 0–37)
Albumin: 3.7 g/dL (ref 3.5–5.2)
Alkaline Phosphatase: 35 U/L — ABNORMAL LOW (ref 39–117)
Anion gap: 6 (ref 5–15)
BILIRUBIN TOTAL: 1.1 mg/dL (ref 0.3–1.2)
BUN: 14 mg/dL (ref 6–23)
CHLORIDE: 103 mmol/L (ref 96–112)
CO2: 26 mmol/L (ref 19–32)
CREATININE: 0.73 mg/dL (ref 0.50–1.35)
Calcium: 8.1 mg/dL — ABNORMAL LOW (ref 8.4–10.5)
GFR calc Af Amer: >90 mL/min (ref >90)
Glucose, Bld: 92 mg/dL (ref 70–99)
Potassium: 3.6 mmol/L (ref 3.5–5.1)
Sodium: 135 mmol/L (ref 135–145)
Total Protein: 6.1 g/dL (ref 6.0–8.3)

## 2014-04-17 MED ORDER — CITALOPRAM HYDROBROMIDE 20 MG PO TABS
20.0000 mg | ORAL_TABLET | Freq: Every day | ORAL | Status: DC
  Administered 2014-04-18 – 2014-04-19 (×2): 20 mg via ORAL
  Filled 2014-04-17 (×2): qty 1

## 2014-04-17 MED ORDER — SODIUM CHLORIDE 0.9 % IV SOLN
INTRAVENOUS | Status: AC
  Administered 2014-04-17 – 2014-04-18 (×2): via INTRAVENOUS

## 2014-04-17 MED ORDER — TRAZODONE HCL 100 MG PO TABS
100.0000 mg | ORAL_TABLET | Freq: Every day | ORAL | Status: DC
  Administered 2014-04-17 – 2014-04-18 (×2): 100 mg via ORAL
  Filled 2014-04-17 (×3): qty 1

## 2014-04-17 NOTE — Consult Note (Signed)
[place] [facility] Consult   Reason for Consult:  Depression, alcohol intoxication, alcohol withdrawal and anxiety  Referring Physician:  Dr. Janeann Merl Patient Identification: Chad Morgan MRN:  [id number] Principal Diagnosis: Alcohol dependence with uncomplicated withdrawal Diagnosis:   Patient Active Problem List   Diagnosis Date Noted  . Tobacco abuse [Z72.0] 04/16/2014  . Alcohol withdrawal [F10.239] 04/16/2014  . Alcohol dependence with uncomplicated withdrawal [H60.737]   . Major depressive disorder, recurrent episode, moderate [F33.1]   . Social anxiety disorder [F40.10] 01/09/2014  . Panic attacks [F41.0] 01/09/2014  . GAD (generalized anxiety disorder) [F41.1] 01/09/2014  . Alcohol dependence [F10.20] 01/08/2014    Total Time spent with patient: 1 hour  Subjective:   Chad Morgan is a 34 y.o. male patient admitted with alcohol intoxication, depression and anxiety seeking for alcohol detox treatment.  HPI: Chad Morgan is a 34 years old separated, unemployed, homeless male admitted to [facility] with alcohol intoxication, increased symptoms of depression and anxiety and requesting alcohol detox treatment. Patient reported he was separated from his girlfriend and misplaced his medication in her place. Patient was not able to access his medication and able to take for the last 1 month. Patient continued to drink alcohol reportedly he drinks half a gallon of liquor daily. Patient has a history of alcohol withdrawal seizures in the past. Patient endorses symptoms of depression, anxiety secondary to multiple psychosocial stresses. Patient denies current suicidal, homicidal ideation, intention or plans patient has no evidence of psychosis. Patient contract for safety. Patient is willing to restart his so previous antidepressant medication citalopram and trazodone. Patient blood alcohol level is 263 and liver function tests are within normal limits.   Chad Morgan is a 34 y.o. male, separated, unemployed, ?homeless, PMH of alcohol dependance, tobacco abuse, PTSD, depression & anxiety presented to the [facility] ED on 04/16/2014 seeking detox from alcohol. Apart from nausea, he denies complaints. He denies vomiting, abdominal pain, diarrhea, dizziness or lightheadedness. NO CP or SOB reported. He states that he was sober for awhile but relapsed in the company of a girlfriend who was abusing alcohol. He was hospitalized to [facility] in January for alcohol detox. Post discharge, he apparently lost his prescriptions for psych meds after 5 days. He indicates that he is frustrated and tired from his alcohol and tobacco dependence and wants to quit. He states that he has PTSD from the [employer] which has been untreated. He feels constantly anxious and depressed but denies suicidal or homicidal ideations or auditory or visual hallucinations. In the ED, patient was found to be tachycardic, lab work unremarkable except blood alcohol level 263. Urine microscopy and UDS: Negative. He was bolused with 2 L of IV fluids and hospitalist admission requested.  HPI Elements:   Location:  Alcohol intoxication, withdrawal, depression anxiety. Quality:  Poor compliance with treatment. Severity:  Alcohol relapse. Timing:  Separated from the girlfriend. Duration:  Few weeks. Context:  Multiple psychosocial stresses including no job, and no family and no finances.  Past Medical History:  Past Medical History  Diagnosis Date  . Seizures   . Anxiety   . Depression   . Alcohol abuse     Past Surgical History  Procedure Laterality Date  . Wisdom tooth extraction     Family History:  Family History  Problem Relation Age of Onset  . Alcoholism Father    Social History:  History  Alcohol Use  . Yes    Comment: 1/5  to 1/2 gallon of liquor daily     History  Drug Use No    Comment: Patient denies     History   Social History  . Marital Status: Legally  Separated    Spouse Name: N/A  . Number of Children: N/A  . Years of Education: N/A   Social History Main Topics  . Smoking status: Current Every Day Smoker -- 1.00 packs/day for 16 years    Types: Cigarettes  . Smokeless tobacco: Never Used  . Alcohol Use: Yes     Comment: 1/5 to 1/2 gallon of liquor daily  . Drug Use: No     Comment: Patient denies   . Sexual Activity: Yes   Other Topics Concern  . None   Social History Narrative   Additional Social History:    Pain Medications: Pt denied Prescriptions: Pt denied Over the Counter: Pt denied History of alcohol / drug use?: Yes Longest period of sobriety (when/how long): Pt could not recall Negative Consequences of Use: Financial, Scientist, research (physical sciences), Personal relationships, Work / School Withdrawal Symptoms: Agitation, Tremors, Weakness, Delirium, DTs, Sweats Name of Substance 1: Alcohol 1 - Age of First Use: 17 1 - Amount (size/oz): Half gallon 1 - Frequency: daily 1 - Duration: ongoing 1 - Last Use / Amount: 04/16/14                   Allergies:  No Known Allergies  Labs:  Results for orders placed or performed during the hospital encounter of 04/16/14 (from the past 48 hour(s))  Comprehensive metabolic panel     Status: Abnormal   Collection Time: 04/16/14  9:32 AM  Result Value Ref Range   Sodium 138 135 - 145 mmol/L   Potassium 4.0 3.5 - 5.1 mmol/L   Chloride 96 96 - 112 mmol/L   CO2 27 19 - 32 mmol/L   Glucose, Bld 130 (H) 70 - 99 mg/dL   BUN 12 6 - 23 mg/dL   Creatinine, Ser 1.00 0.50 - 1.35 mg/dL   Calcium 8.5 8.4 - 10.5 mg/dL   Total Protein 8.6 (H) 6.0 - 8.3 g/dL   Albumin 5.1 3.5 - 5.2 g/dL   AST 32 0 - 37 U/L   ALT 30 0 - 53 U/L   Alkaline Phosphatase 42 39 - 117 U/L   Total Bilirubin 0.7 0.3 - 1.2 mg/dL   GFR calc non Af Amer >90 >90 mL/min   GFR calc Af Amer >90 >90 mL/min    Comment: (NOTE) The eGFR has been calculated using the CKD EPI equation. This calculation has not been validated in all  clinical situations. eGFR's persistently <90 mL/min signify possible Chronic Kidney Disease.    Anion gap 15 5 - 15  Ethanol     Status: Abnormal   Collection Time: 04/16/14  9:32 AM  Result Value Ref Range   Alcohol, Ethyl (B) 263 (H) 0 - 9 mg/dL    Comment:        LOWEST DETECTABLE LIMIT FOR SERUM ALCOHOL IS 11 mg/dL FOR MEDICAL PURPOSES ONLY   CBC with Differential     Status: Abnormal   Collection Time: 04/16/14  9:32 AM  Result Value Ref Range   WBC 12.2 (H) 4.0 - 10.5 K/uL   RBC 5.20 4.22 - 5.81 MIL/uL   Hemoglobin 17.4 (H) 13.0 - 17.0 g/dL   HCT 49.3 39.0 - 52.0 %   MCV 94.8 78.0 - 100.0 fL   MCH 33.5 26.0 - 34.0  pg   MCHC 35.3 30.0 - 36.0 g/dL   RDW 12.1 11.5 - 15.5 %   Platelets 338 150 - 400 K/uL   Neutrophils Relative % 40 (L) 43 - 77 %   Lymphocytes Relative 51 (H) 12 - 46 %   Monocytes Relative 8 3 - 12 %   Eosinophils Relative 1 0 - 5 %   Basophils Relative 0 0 - 1 %   Neutro Abs 4.9 1.7 - 7.7 K/uL   Lymphs Abs 6.2 (H) 0.7 - 4.0 K/uL   Monocytes Absolute 1.0 0.1 - 1.0 K/uL   Eosinophils Absolute 0.1 0.0 - 0.7 K/uL   Basophils Absolute 0.0 0.0 - 0.1 K/uL   WBC Morphology ABSOLUTE LYMPHOCYTOSIS    Smear Review PENDING PATHOLOGIST REVIEW   Lipase, blood     Status: None   Collection Time: 04/16/14  9:32 AM  Result Value Ref Range   Lipase 21 11 - 59 U/L  Pathologist smear review     Status: None   Collection Time: 04/16/14  9:32 AM  Result Value Ref Range   Path Review Reviewed By Violet Baldy, M.D.     Comment: 04.11.16 LYMPHOCYTOSIS, FAVOR REACTIVE.   Urinalysis, Routine w reflex microscopic     Status: Abnormal   Collection Time: 04/16/14 11:21 AM  Result Value Ref Range   Color, Urine YELLOW YELLOW   APPearance CLOUDY (A) CLEAR   Specific Gravity, Urine 1.023 1.005 - 1.030   pH 5.5 5.0 - 8.0   Glucose, UA NEGATIVE NEGATIVE mg/dL   Hgb urine dipstick NEGATIVE NEGATIVE   Bilirubin Urine NEGATIVE NEGATIVE   Ketones, ur NEGATIVE NEGATIVE mg/dL    Protein, ur NEGATIVE NEGATIVE mg/dL   Urobilinogen, UA 0.2 0.0 - 1.0 mg/dL   Nitrite NEGATIVE NEGATIVE   Leukocytes, UA NEGATIVE NEGATIVE    Comment: MICROSCOPIC NOT DONE ON URINES WITH NEGATIVE PROTEIN, BLOOD, LEUKOCYTES, NITRITE, OR GLUCOSE <1000 mg/dL.  Urine rapid drug screen (hosp performed)     Status: None   Collection Time: 04/16/14 11:21 AM  Result Value Ref Range   Opiates NONE DETECTED NONE DETECTED   Cocaine NONE DETECTED NONE DETECTED   Benzodiazepines NONE DETECTED NONE DETECTED   Amphetamines NONE DETECTED NONE DETECTED   Tetrahydrocannabinol NONE DETECTED NONE DETECTED   Barbiturates NONE DETECTED NONE DETECTED    Comment:        DRUG SCREEN FOR MEDICAL PURPOSES ONLY.  IF CONFIRMATION IS NEEDED FOR ANY PURPOSE, NOTIFY LAB WITHIN 5 DAYS.        LOWEST DETECTABLE LIMITS FOR URINE DRUG SCREEN Drug Class       Cutoff (ng/mL) Amphetamine      1000 Barbiturate      200 Benzodiazepine   297 Tricyclics       989 Opiates          300 Cocaine          300 THC              50   Protime-INR     Status: None   Collection Time: 04/16/14  4:21 PM  Result Value Ref Range   Prothrombin Time 15.0 11.6 - 15.2 seconds   INR 1.16 0.00 - 1.49  Comprehensive metabolic panel     Status: Abnormal   Collection Time: 04/17/14  5:25 AM  Result Value Ref Range   Sodium 135 135 - 145 mmol/L   Potassium 3.6 3.5 - 5.1 mmol/L   Chloride 103 96 -  112 mmol/L   CO2 26 19 - 32 mmol/L   Glucose, Bld 92 70 - 99 mg/dL   BUN 14 6 - 23 mg/dL   Creatinine, Ser 0.73 0.50 - 1.35 mg/dL   Calcium 8.1 (L) 8.4 - 10.5 mg/dL   Total Protein 6.1 6.0 - 8.3 g/dL   Albumin 3.7 3.5 - 5.2 g/dL   AST 21 0 - 37 U/L   ALT 23 0 - 53 U/L   Alkaline Phosphatase 35 (L) 39 - 117 U/L   Total Bilirubin 1.1 0.3 - 1.2 mg/dL   GFR calc non Af Amer >90 >90 mL/min   GFR calc Af Amer >90 >90 mL/min    Comment: (NOTE) The eGFR has been calculated using the CKD EPI equation. This calculation has not been validated  in all clinical situations. eGFR's persistently <90 mL/min signify possible Chronic Kidney Disease.    Anion gap 6 5 - 15  CBC     Status: Abnormal   Collection Time: 04/17/14  5:25 AM  Result Value Ref Range   WBC 9.0 4.0 - 10.5 K/uL   RBC 3.85 (L) 4.22 - 5.81 MIL/uL   Hemoglobin 12.8 (L) 13.0 - 17.0 g/dL    Comment: REPEATED TO VERIFY DELTA CHECK NOTED    HCT 36.8 (L) 39.0 - 52.0 %   MCV 95.6 78.0 - 100.0 fL   MCH 33.2 26.0 - 34.0 pg   MCHC 34.8 30.0 - 36.0 g/dL   RDW 12.0 11.5 - 15.5 %   Platelets 239 150 - 400 K/uL    Comment: DELTA CHECK NOTED REPEATED TO VERIFY     Vitals: Blood pressure 112/50, pulse 105, temperature 98.1 F (36.7 C), temperature source Oral, resp. rate 16, SpO2 97 %.  Risk to Self: Suicidal Ideation: No Suicidal Intent: No Is patient at risk for suicide?: No Suicidal Plan?: No Access to Means: No What has been your use of drugs/alcohol within the last 12 months?: NA How many times?: 0 Other Self Harm Risks: NA Triggers for Past Attempts: None known Intentional Self Injurious Behavior: None Risk to Others: Homicidal Ideation: No Thoughts of Harm to Others: No Current Homicidal Intent: No Current Homicidal Plan: No Access to Homicidal Means: No Identified Victim: NA History of harm to others?: No Assessment of Violence: None Noted Violent Behavior Description: NA Does patient have access to weapons?: No Criminal Charges Pending?: No Does patient have a court date: No Prior Inpatient Therapy: Prior Inpatient Therapy: Yes Prior Therapy Dates: 2016 Prior Therapy Facilty/Provider(s):  Pt could not recall Reason for Treatment: Alcohol abuse Prior Outpatient Therapy: Prior Outpatient Therapy: No Prior Therapy Dates: Na Prior Therapy Facilty/Provider(s): NA Reason for Treatment: NA  Current Facility-Administered Medications  Medication Dose Route Frequency Provider Last Rate Last Dose  . acetaminophen (TYLENOL) tablet 650 mg  650 mg Oral  Q6H PRN Modena Jansky, MD       Or  . acetaminophen (TYLENOL) suppository 650 mg  650 mg Rectal Q6H PRN Modena Jansky, MD      . albuterol (PROVENTIL) (2.5 MG/3ML) 0.083% nebulizer solution 2.5 mg  2.5 mg Nebulization Q2H PRN Modena Jansky, MD      . alum & mag hydroxide-simeth (MAALOX/MYLANTA) 200-200-20 MG/5ML suspension 30 mL  30 mL Oral Q6H PRN Modena Jansky, MD      . Derrill Memo ON 04/18/2014] citalopram (CELEXA) tablet 20 mg  20 mg Oral Daily Ambrose Finland, MD      . enoxaparin (LOVENOX) injection  40 mg  40 mg Subcutaneous Q24H Modena Jansky, MD   40 mg at 04/16/14 2147  . folic acid (FOLVITE) tablet 1 mg  1 mg Oral Daily Modena Jansky, MD   1 mg at 04/17/14 1100  . LORazepam (ATIVAN) tablet 1 mg  1 mg Oral Q6H PRN Modena Jansky, MD   1 mg at 04/17/14 9937   Or  . LORazepam (ATIVAN) injection 1 mg  1 mg Intravenous Q6H PRN Modena Jansky, MD   1 mg at 04/17/14 1696  . LORazepam (ATIVAN) tablet 0-4 mg  0-4 mg Oral Q6H Modena Jansky, MD   4 mg at 04/17/14 1229   Followed by  . [START ON 04/18/2014] LORazepam (ATIVAN) tablet 0-4 mg  0-4 mg Oral Q12H Modena Jansky, MD      . multivitamin with minerals tablet 1 tablet  1 tablet Oral Daily Modena Jansky, MD   1 tablet at 04/17/14 1100  . nicotine (NICODERM CQ - dosed in mg/24 hours) patch 21 mg  21 mg Transdermal Daily Modena Jansky, MD   21 mg at 04/17/14 1100  . ondansetron (ZOFRAN) tablet 4 mg  4 mg Oral Q6H PRN Modena Jansky, MD       Or  . ondansetron [facility]) injection 4 mg  4 mg Intravenous Q6H PRN Modena Jansky, MD   4 mg at 04/17/14 7893  . pantoprazole (PROTONIX) EC tablet 40 mg  40 mg Oral Daily Modena Jansky, MD   40 mg at 04/17/14 1100  . sodium chloride 0.9 % injection 3 mL  3 mL Intravenous Q12H Modena Jansky, MD   3 mL at 04/16/14 2200  . thiamine (VITAMIN B-1) tablet 100 mg  100 mg Oral Daily Modena Jansky, MD   100 mg at 04/17/14 1100   Or  . thiamine (B-1) injection 100  mg  100 mg Intravenous Daily Modena Jansky, MD      . traZODone (DESYREL) tablet 100 mg  100 mg Oral QHS Ambrose Finland, MD        Musculoskeletal: Strength & Muscle Tone: decreased Gait & Station: unable to stand Patient leans: N/A  Psychiatric Specialty Exam: Physical Exam as per history and physical   ROS alcohol intoxication, depression, anxiety, history of posttraumatic stress disorder   Blood pressure 112/50, pulse 105, temperature 98.1 F (36.7 C), temperature source Oral, resp. rate 16, SpO2 97 %.There is no weight on file to calculate BMI.  General Appearance: Disheveled  Eye Sport and exercise psychologist::  Fair  Speech:  Clear and Coherent and Slow  Volume:  Decreased  Mood:  Anxious, Depressed and Worthless  Affect:  Constricted and Depressed  Thought Process:  Coherent and Goal Directed  Orientation:  Full (Time, Place, and Person)  Thought Content:  Rumination  Suicidal Thoughts:  No  Homicidal Thoughts:  No  Memory:  Immediate;   Fair Recent;   Fair  Judgement:  Fair  Insight:  Fair  Psychomotor Activity:  Decreased  Concentration:  Fair  Recall:  [employer] of Knowledge:Good  Language: Good  Akathisia:  Negative  Handed:  Right  AIMS (if indicated):     Assets:  Communication Skills Desire for Improvement Leisure Time Resilience  ADL's:  Impaired  Cognition: WNL  Sleep:      Medical Decision Making: Review of Psycho-Social Stressors (1), Review or order clinical lab tests (1), Review and summation of old records (2), Established Problem, Worsening (2),  Review of Last Therapy Session (1), Review or order medicine tests (1), Review of Medication Regimen & Side Effects (2) and Review of New Medication or Change in Dosage (2)  Treatment Plan Summary: Daily contact with patient to assess and evaluate symptoms and progress in treatment and Medication management  Plan: We start citalopram 20 mg daily for depression anxiety and trazodone 100 mg at bedtime for  insomnia Monitor for alcohol withdrawal symptoms and seizures Continue alcohol detox treatment has ordered including CIWA protocol Patient does not meet criteria for psychiatric inpatient admission. Supportive therapy provided about ongoing stressors.  Appreciate psychiatric consultation and follow up as clinically required Please contact [phone] or [phone] if needs further assistance  Disposition: Patient may be referred to the outpatient psychiatric services and medically stable as patient does not meet criteria for inpatient hospitalization at behavioral [place] [facility]. 04/17/2014 1:43 PM

## 2014-04-17 NOTE — Progress Notes (Signed)
CARE MANAGEMENT NOTE 04/17/2014  Patient:  SAAHIR, PRUDE   Account Number:  [id number]  Date Initiated:  04/17/2014  Documentation initiated by:  Edwyna Shell  Subjective/Objective Assessment:   34 yo male admitted with ETOH withdrawals     Action/Plan:   discharge planning   Anticipated DC Date:  04/19/2014   Anticipated DC Plan:  HOME/SELF CARE  In-house referral  Clinical Social Worker      DC Planning Services  CM consult      Choice offered to / List presented to:             Status of service:  Completed, signed off Medicare Important Message given?   (If response is "NO", the following Medicare IM given date fields will be blank) Date Medicare IM given:   Medicare IM given by:   Date Additional Medicare IM given:   Additional Medicare IM given by:    Discharge Disposition:  HOME/SELF CARE  Per UR Regulation:    If discussed at Long Length of Stay Meetings, dates discussed:    Comments:  04/17/14 Edwyna Shell RN BSN CM [phone] Patient stated that he is homeless at this time and when he has attempted to stray in a local shelter they are full. He stated that he was living with a friend for a while and then he moved in with a woman. Recently he has been living on the streets wherever he can find shelter. Discussed resources provided in the past by the ED case manager and he stated that he has not followed up. Also discussed follow up recommendations from  behavioral health in the past and the patient stated that he has not followed up with any community resources. Provided patient with pamphlet for [facility] and explained services available. Patient verbalized understanding and had no questions or concerns.

## 2014-04-17 NOTE — Progress Notes (Signed)
PROGRESS NOTE    Chad Morgan HYW:[id number] DOB: [birth year] DOA: 04/16/2014 PCP: No PCP Per Patient  HPI/Brief narrative 34 y.o. male, separated, unemployed, ?homeless, PMH of alcohol dependance, tobacco abuse, PTSD, depression & anxiety presented to the [facility] ED on 04/16/2014 seeking detox from alcohol. In the ED, patient was found to be tachycardic, lab work unremarkable except blood alcohol level 263. Urine microscopy and UDS: Negative. He was admitted for alcohol withdrawal   Assessment/Plan:  Principal Problem:  Alcohol dependence with uncomplicated withdrawal - At risk for severe withdrawal/DTs - Admitted to telemetry - Started CIWA protocol including scheduled Ativan. - Psychiatry consultation appreciated. - Continue management.  Active Problems:  Alcohol dependence - Management as above.   Tobacco abuse - Cessation counseled. - Nicotine patch   Major depressive disorder, recurrent episode, moderate - Patient denies SI/HI. - States that the medications he was briefly on post discharge from [facility] in January helped but he lost the medications. - Psychiatry consultation appreciated - Psychiatry starting citalopram for depression & anxiety and trazodone at bedtime for insomnia   Panic attacks/ GAD (generalized anxiety disorder) - Psychiatry consultation appreciated   Tachycardia - Asymptomatic - Secondary to alcohol withdrawal - Management as above   Nausea - Secondary to alcohol withdrawal - PPI - No vomiting   ? Homelessness - Clinical social worker consulted - Case management also consulted for outpatient PCP follow-up and medication assistance.   History of seizures - Likely related to alcohol abuse - None recently per patient - Monitor for seizures   Mild anemia - Hemoglobin dropped from 17.4 on admission to 12.8. Lot of this drop may be secondary to hemoconcentration which improved after IV fluids - Follow CBC in  a.m.   Code Status: Full Family Communication: None at bedside Disposition Plan: Not medically stable for DC. He is still at risk for complications and decline.   Consultants:  Psychiatry  Procedures:  None  Antibiotics:  None   Subjective: Feels slightly better. Complains of headache, nausea, tremulousness.  Objective: Filed Vitals:   04/16/14 1408 04/16/14 2105 04/17/14 0509 04/17/14 1434  BP: 114/44 97/53 112/50 114/55  Pulse: 123 119 105 104  Temp: 97.9 F (36.6 C) 98.7 F (37.1 C) 98.1 F (36.7 C) 97.7 F (36.5 C)  TempSrc: Oral Oral Oral Oral  Resp: 20 20 16 18   SpO2: 95% 96% 97% 96%   No intake or output data in the 24 hours ending 04/17/14 1509 There were no vitals filed for this visit.   Exam:  General exam: Pleasant young male sitting up comfortably in bed, appears slightly anxious but in no obvious distress Respiratory system: Clear. No increased work of breathing. Cardiovascular system: S1 & S2 heard, regular mild tachycardia. No JVD, murmurs, gallops, clicks or pedal edema. Telemetry: SR 90-ST 110's Gastrointestinal system: Abdomen is nondistended, soft and nontender. Normal bowel sounds heard. Central nervous system: Alert and oriented. No focal neurological deficits. Extremities: Symmetric 5 x 5 power. Mild tremulousness Psychiatry: Slightly anxious but coherent and cooperative   Data Reviewed: Basic Metabolic Panel:  Recent Labs Lab 04/16/14 0932 04/17/14 0525  NA 138 135  K 4.0 3.6  CL 96 103  CO2 27 26  GLUCOSE 130* 92  BUN 12 14  CREATININE 1.00 0.73  CALCIUM 8.5 8.1*   Liver Function Tests:  Recent Labs Lab 04/16/14 0932 04/17/14 0525  AST 32 21  ALT 30 23  ALKPHOS 42 35*  BILITOT 0.7 1.1  PROT 8.6*  6.1  ALBUMIN 5.1 3.7    Recent Labs Lab 04/16/14 0932  LIPASE 21   No results for input(s): AMMONIA in the last 168 hours. CBC:  Recent Labs Lab 04/16/14 0932 04/17/14 0525  WBC 12.2* 9.0  NEUTROABS 4.9  --    HGB 17.4* 12.8*  HCT 49.3 36.8*  MCV 94.8 95.6  PLT 338 239   Cardiac Enzymes: No results for input(s): CKTOTAL, CKMB, CKMBINDEX, TROPONINI in the last 168 hours. BNP (last 3 results) No results for input(s): PROBNP in the last 8760 hours. CBG: No results for input(s): GLUCAP in the last 168 hours.  No results found for this or any previous visit (from the past 240 hour(s)).    Studies: No results found.      Scheduled Meds: . [START ON 04/18/2014] citalopram  20 mg Oral Daily  . enoxaparin (LOVENOX) injection  40 mg Subcutaneous Q24H  . folic acid  1 mg Oral Daily  . LORazepam  0-4 mg Oral Q6H   Followed by  . [START ON 04/18/2014] LORazepam  0-4 mg Oral Q12H  . multivitamin with minerals  1 tablet Oral Daily  . nicotine  21 mg Transdermal Daily  . pantoprazole  40 mg Oral Daily  . sodium chloride  3 mL Intravenous Q12H  . thiamine  100 mg Oral Daily   Or  . thiamine  100 mg Intravenous Daily  . traZODone  100 mg Oral QHS   Continuous Infusions:   Principal Problem:   Alcohol dependence with uncomplicated withdrawal Active Problems:   Alcohol dependence   Tobacco abuse   Major depressive disorder, recurrent episode, moderate   Panic attacks   GAD (generalized anxiety disorder)   Alcohol withdrawal    Time spent: 30 minutes.    Vernell Leep, MD, FACP, FHM. [facility] Pager [phone]  If 7PM-7AM, please contact night-coverage [email or web address] Password [email or web address] 04/17/2014, 3:09 PM    LOS: 1 day

## 2014-04-18 LAB — CBC
HEMATOCRIT: 37.4 % — AB (ref 39.0–52.0)
Hemoglobin: 12.6 g/dL — ABNORMAL LOW (ref 13.0–17.0)
MCH: 32.6 pg (ref 26.0–34.0)
MCHC: 33.7 g/dL (ref 30.0–36.0)
MCV: 96.9 fL (ref 78.0–100.0)
Platelets: 240 10*3/uL (ref 150–400)
RBC: 3.86 MIL/uL — AB (ref 4.22–5.81)
RDW: 12.2 % (ref 11.5–15.5)
WBC: 8.1 10*3/uL (ref 4.0–10.5)

## 2014-04-18 NOTE — Progress Notes (Signed)
Clinical Social Work Department CLINICAL SOCIAL WORK PSYCHIATRY SERVICE LINE ASSESSMENT 04/18/2014  Patient:  Chad Morgan  Account:  [id number]  [place] Date:  04/16/2014  Clinical Social Worker:  Peri Maris, CLINICAL SOCIAL WORKER  Date/Time:  04/18/2014 10:00 AM Referred by:  Physician  Date referred:  04/17/2014 Reason for Referral  Behavioral Health Issues  Substance Abuse   Presenting Symptoms/Problems (In the person's/family's own words):   Pt has history and current alcohol abuse along with depression   Abuse/Neglect/Trauma History (check all that apply)  Denies history   Abuse/Neglect/Trauma Comments:   N/A   Psychiatric History (check all that apply)  Inpatient/hospitilization   Psychiatric medications:  Celexa 74m  Ativan 115m Trazadone 10010m Current Mental Health Hospitalizations/Previous Mental Health History:   Pt reports long history of alcohol abuse. Pt also reports that he has symptoms of PTSD from his time in the [employer] Current provider:   Pt reports that he does not have one.   Place and Date:   N/A   Current Medications:   Scheduled Meds:      . citalopram  20 mg Oral Daily  . enoxaparin (LOVENOX) injection  40 mg Subcutaneous Q24H  . folic acid  1 mg Oral Daily  . LORazepam  0-4 mg Oral Q6H   Followed by     . LORazepam  0-4 mg Oral Q12H  . multivitamin with minerals  1 tablet Oral Daily  . nicotine  21 mg Transdermal Daily  . pantoprazole  40 mg Oral Daily  . sodium chloride  3 mL Intravenous Q12H  . thiamine  100 mg Oral Daily   Or     . thiamine  100 mg Intravenous Daily  . traZODone  100 mg Oral QHS        Continuous Infusions:      PRN Meds:.acetaminophen **OR** acetaminophen, albuterol, alum & mag hydroxide-simeth, LORazepam **OR** LORazepam, ondansetron **OR** ondansetron (ZOFRAN) IV       Previous Impatient Admission/Date/Reason:   [facility]   Emotional Health / Current Symptoms    Suicide/Self Harm  None  reported   Suicide attempt in the past:   Pt denies   Other harmful behavior:   Pt denies   Psychotic/Dissociative Symptoms  None reported   Other Psychotic/Dissociative Symptoms:   N/A    Attention/Behavioral Symptoms  Within Normal Limits   Other Attention / Behavioral Symptoms:   N/A    Cognitive Impairment  Within Normal Limits   Other Cognitive Impairment:   N/A    Mood and Adjustment  DEPRESSION    Stress, Anxiety, Trauma, Any Recent Loss/Stressor  Other - See comment   Anxiety (frequency):   In context of psychosocial stressors   Phobia (specify):   N/A   Compulsive behavior (specify):   N/A   Obsessive behavior (specify):   N/A   Other:   Pt reports that he has PTSD from his time in the [employer] did not go into detail about specific incidents or events related to the PTSD.   Substance Abuse/Use  Substance abuse treatment needed  History of substance use  Current substance use   SBIRT completed (please refer for detailed history):  N  Self-reported substance use:   Pt reports that he has been drinking for about 10 years; most recently drinking approximately 1/5 of liquor daily. Pt's last use was 04/15/14   Urinary Drug Screen Completed:  Y Alcohol level:   263  on 04/16/14    Environmental/Housing/Living Arrangement  HOMELESS   Who is in the home:   N/A   Emergency contact:  None listed   Financial  IPRS   Patient's Strengths and Goals (patient's own words):   Pt reports that he has had periods of sobriety in the past. He also reports being motivated to stop drinking.   Clinical Social Worker's Interpretive Summary:   CSW met with Pt to discuss treatment options. Pt was pleasant and cooperative throughout assessment. He reports history of alcohol abuse for approximately 10 years. He reported that he does not have a current provider. CSW provided Pt with list of outpatient substance abuse treatment facilities. CSW and Pt  discussed Pt's desire for inpatient treatment. Pt reported that he is considering residential treamtment and would like more information. Pt also discussed his concerns related to housing and income. CSW to provide resources for those needs as well.   Disposition:  Outpatient referral made/needed  Peri Maris, [place] 04/18/2014 2:01 PM [phone]

## 2014-04-18 NOTE — Consult Note (Signed)
Psychiatry Consult follow-up  Reason for Consult:  Depression, alcohol intoxication, alcohol withdrawal and anxiety  Referring Physician:  Dr. Janeann Merl Patient Identification: Chad Morgan MRN:  [id number] Principal Diagnosis: Alcohol dependence with uncomplicated withdrawal Diagnosis:   Patient Active Problem List   Diagnosis Date Noted  . Tobacco abuse [Z72.0] 04/16/2014  . Alcohol withdrawal [F10.239] 04/16/2014  . Alcohol dependence with uncomplicated withdrawal [T01.779]   . Major depressive disorder, recurrent episode, moderate [F33.1]   . Social anxiety disorder [F40.10] 01/09/2014  . Panic attacks [F41.0] 01/09/2014  . GAD (generalized anxiety disorder) [F41.1] 01/09/2014  . Alcohol dependence [F10.20] 01/08/2014    Total Time spent with patient: 30 minutes  Subjective:   Chad Morgan is a 34 y.o. male patient admitted with alcohol intoxication, depression and anxiety seeking for alcohol detox treatment.  HPI: Chad Morgan is a 34 years old separated, unemployed, homeless male admitted to [facility] with alcohol intoxication, increased symptoms of depression and anxiety and requesting alcohol detox treatment. Patient reported he was separated from his girlfriend and misplaced his medication in her place. Patient was not able to access his medication and able to take for the last 1 month. Patient continued to drink alcohol reportedly he drinks half a gallon of liquor daily. Patient has a history of alcohol withdrawal seizures in the past. Patient endorses symptoms of depression, anxiety secondary to multiple psychosocial stresses. Patient denies current suicidal, homicidal ideation, intention or plans patient has no evidence of psychosis. Patient contract for safety. Patient is willing to restart his so previous antidepressant medication citalopram and trazodone. Patient blood alcohol level is 263 and liver function tests are within normal limits.  Interval  history: Patient seen today with the psychiatric social service for psychiatric consultation follow-up. Patient endorses symptoms of depression anxiety and alcohol withdrawal signs and symptoms including fine tremors on both hands on extension. Patient reported cold sweats, disturbed sleep, and generalized weakness. Patient has been compliant with his medication citalopram and trazodone and reportedly no adverse effects. Patient is willing to participate in substance abuse rehabilitation once completed alcohol detox treatment. Patient denies current suicidal/homicidal ideation, intention or plans. He has no evidence of psychotic symptoms and patient contract for safety while in the hospital.   Medical history: Chad Morgan is a 34 y.o. male, separated, unemployed, ?homeless, PMH of alcohol dependance, tobacco abuse, PTSD, depression & anxiety presented to the [facility] ED on 04/16/2014 seeking detox from alcohol. Apart from nausea, he denies complaints. He denies vomiting, abdominal pain, diarrhea, dizziness or lightheadedness. NO CP or SOB reported. He states that he was sober for awhile but relapsed in the company of a girlfriend who was abusing alcohol. He was hospitalized to [facility] in January for alcohol detox. Post discharge, he apparently lost his prescriptions for psych meds after 5 days. He indicates that he is frustrated and tired from his alcohol and tobacco dependence and wants to quit. He states that he has PTSD from the [employer] which has been untreated. He feels constantly anxious and depressed but denies suicidal or homicidal ideations or auditory or visual hallucinations. In the ED, patient was found to be tachycardic, lab work unremarkable except blood alcohol level 263. Urine microscopy and UDS: Negative. He was bolused with 2 L of IV fluids and hospitalist admission requested.   Past Medical History:  Past Medical History  Diagnosis Date  . Seizures   . Anxiety   .  Depression   . Alcohol abuse  Past Surgical History  Procedure Laterality Date  . Wisdom tooth extraction     Family History:  Family History  Problem Relation Age of Onset  . Alcoholism Father    Social History:  History  Alcohol Use  . Yes    Comment: 1/5 to 1/2 gallon of liquor daily     History  Drug Use No    Comment: Patient denies     History   Social History  . Marital Status: Legally Separated    Spouse Name: N/A  . Number of Children: N/A  . Years of Education: N/A   Social History Main Topics  . Smoking status: Current Every Day Smoker -- 1.00 packs/day for 16 years    Types: Cigarettes  . Smokeless tobacco: Never Used  . Alcohol Use: Yes     Comment: 1/5 to 1/2 gallon of liquor daily  . Drug Use: No     Comment: Patient denies   . Sexual Activity: Yes   Other Topics Concern  . None   Social History Narrative   Additional Social History:    Pain Medications: Pt denied Prescriptions: Pt denied Over the Counter: Pt denied History of alcohol / drug use?: Yes Longest period of sobriety (when/how long): Pt could not recall Negative Consequences of Use: Financial, Scientist, research (physical sciences), Personal relationships, Work / School Withdrawal Symptoms: Agitation, Tremors, Weakness, Delirium, DTs, Sweats Name of Substance 1: Alcohol 1 - Age of First Use: 17 1 - Amount (size/oz): Half gallon 1 - Frequency: daily 1 - Duration: ongoing 1 - Last Use / Amount: 04/16/14                   Allergies:  No Known Allergies  Labs:  Results for orders placed or performed during the hospital encounter of 04/16/14 (from the past 48 hour(s))  Protime-INR     Status: None   Collection Time: 04/16/14  4:21 PM  Result Value Ref Range   Prothrombin Time 15.0 11.6 - 15.2 seconds   INR 1.16 0.00 - 1.49  Comprehensive metabolic panel     Status: Abnormal   Collection Time: 04/17/14  5:25 AM  Result Value Ref Range   Sodium 135 135 - 145 mmol/L   Potassium 3.6 3.5 - 5.1 mmol/L    Chloride 103 96 - 112 mmol/L   CO2 26 19 - 32 mmol/L   Glucose, Bld 92 70 - 99 mg/dL   BUN 14 6 - 23 mg/dL   Creatinine, Ser 0.73 0.50 - 1.35 mg/dL   Calcium 8.1 (L) 8.4 - 10.5 mg/dL   Total Protein 6.1 6.0 - 8.3 g/dL   Albumin 3.7 3.5 - 5.2 g/dL   AST 21 0 - 37 U/L   ALT 23 0 - 53 U/L   Alkaline Phosphatase 35 (L) 39 - 117 U/L   Total Bilirubin 1.1 0.3 - 1.2 mg/dL   GFR calc non Af Amer >90 >90 mL/min   GFR calc Af Amer >90 >90 mL/min    Comment: (NOTE) The eGFR has been calculated using the CKD EPI equation. This calculation has not been validated in all clinical situations. eGFR's persistently <90 mL/min signify possible Chronic Kidney Disease.    Anion gap 6 5 - 15  CBC     Status: Abnormal   Collection Time: 04/17/14  5:25 AM  Result Value Ref Range   WBC 9.0 4.0 - 10.5 K/uL   RBC 3.85 (L) 4.22 - 5.81 MIL/uL   Hemoglobin 12.8 (L) 13.0 -  17.0 g/dL    Comment: REPEATED TO VERIFY DELTA CHECK NOTED    HCT 36.8 (L) 39.0 - 52.0 %   MCV 95.6 78.0 - 100.0 fL   MCH 33.2 26.0 - 34.0 pg   MCHC 34.8 30.0 - 36.0 g/dL   RDW 12.0 11.5 - 15.5 %   Platelets 239 150 - 400 K/uL    Comment: DELTA CHECK NOTED REPEATED TO VERIFY   CBC     Status: Abnormal   Collection Time: 04/18/14  5:31 AM  Result Value Ref Range   WBC 8.1 4.0 - 10.5 K/uL   RBC 3.86 (L) 4.22 - 5.81 MIL/uL   Hemoglobin 12.6 (L) 13.0 - 17.0 g/dL   HCT 37.4 (L) 39.0 - 52.0 %   MCV 96.9 78.0 - 100.0 fL   MCH 32.6 26.0 - 34.0 pg   MCHC 33.7 30.0 - 36.0 g/dL   RDW 12.2 11.5 - 15.5 %   Platelets 240 150 - 400 K/uL    Vitals: Blood pressure 126/78, pulse 92, temperature 98.2 F (36.8 C), temperature source Oral, resp. rate 18, SpO2 96 %.  Risk to Self: Suicidal Ideation: No Suicidal Intent: No Is patient at risk for suicide?: No Suicidal Plan?: No Access to Means: No What has been your use of drugs/alcohol within the last 12 months?: NA How many times?: 0 Other Self Harm Risks: NA Triggers for Past  Attempts: None known Intentional Self Injurious Behavior: None Risk to Others: Homicidal Ideation: No Thoughts of Harm to Others: No Current Homicidal Intent: No Current Homicidal Plan: No Access to Homicidal Means: No Identified Victim: NA History of harm to others?: No Assessment of Violence: None Noted Violent Behavior Description: NA Does patient have access to weapons?: No Criminal Charges Pending?: No Does patient have a court date: No Prior Inpatient Therapy: Prior Inpatient Therapy: Yes Prior Therapy Dates: 2016 Prior Therapy Facilty/Provider(s):  Pt could not recall Reason for Treatment: Alcohol abuse Prior Outpatient Therapy: Prior Outpatient Therapy: No Prior Therapy Dates: Na Prior Therapy Facilty/Provider(s): NA Reason for Treatment: NA  Current Facility-Administered Medications  Medication Dose Route Frequency Provider Last Rate Last Dose  . acetaminophen (TYLENOL) tablet 650 mg  650 mg Oral Q6H PRN Modena Jansky, MD   650 mg at 04/18/14 9381   Or  . acetaminophen (TYLENOL) suppository 650 mg  650 mg Rectal Q6H PRN Modena Jansky, MD      . albuterol (PROVENTIL) (2.5 MG/3ML) 0.083% nebulizer solution 2.5 mg  2.5 mg Nebulization Q2H PRN Modena Jansky, MD      . alum & mag hydroxide-simeth (MAALOX/MYLANTA) 200-200-20 MG/5ML suspension 30 mL  30 mL Oral Q6H PRN Modena Jansky, MD      . citalopram (CELEXA) tablet 20 mg  20 mg Oral Daily Ambrose Finland, MD   20 mg at 04/18/14 1035  . enoxaparin (LOVENOX) injection 40 mg  40 mg Subcutaneous Q24H Modena Jansky, MD   40 mg at 04/17/14 2211  . folic acid (FOLVITE) tablet 1 mg  1 mg Oral Daily Modena Jansky, MD   1 mg at 04/18/14 1035  . LORazepam (ATIVAN) tablet 1 mg  1 mg Oral Q6H PRN Modena Jansky, MD   1 mg at 04/18/14 0053   Or  . LORazepam (ATIVAN) injection 1 mg  1 mg Intravenous Q6H PRN Modena Jansky, MD   1 mg at 04/18/14 0604  . LORazepam (ATIVAN) tablet 0-4 mg  0-4 mg Oral Q6H Anand  D  Hongalgi, MD   4 mg at 04/18/14 1034   Followed by  . LORazepam (ATIVAN) tablet 0-4 mg  0-4 mg Oral Q12H Modena Jansky, MD      . multivitamin with minerals tablet 1 tablet  1 tablet Oral Daily Modena Jansky, MD   1 tablet at 04/18/14 1034  . nicotine (NICODERM CQ - dosed in mg/24 hours) patch 21 mg  21 mg Transdermal Daily Modena Jansky, MD   21 mg at 04/18/14 1036  . ondansetron (ZOFRAN) tablet 4 mg  4 mg Oral Q6H PRN Modena Jansky, MD       Or  . ondansetron [facility]) injection 4 mg  4 mg Intravenous Q6H PRN Modena Jansky, MD   4 mg at 04/18/14 0521  . pantoprazole (PROTONIX) EC tablet 40 mg  40 mg Oral Daily Modena Jansky, MD   40 mg at 04/18/14 1035  . sodium chloride 0.9 % injection 3 mL  3 mL Intravenous Q12H Modena Jansky, MD   3 mL at 04/16/14 2200  . thiamine (VITAMIN B-1) tablet 100 mg  100 mg Oral Daily Modena Jansky, MD   100 mg at 04/18/14 1034   Or  . thiamine (B-1) injection 100 mg  100 mg Intravenous Daily Modena Jansky, MD      . traZODone (DESYREL) tablet 100 mg  100 mg Oral QHS Ambrose Finland, MD   100 mg at 04/17/14 2211    Musculoskeletal: Strength & Muscle Tone: decreased Gait & Station: unable to stand Patient leans: N/A  Psychiatric Specialty Exam: Physical Exam as per history and physical   ROS alcohol intoxication, depression, anxiety, history of posttraumatic stress disorder   Blood pressure 126/78, pulse 92, temperature 98.2 F (36.8 C), temperature source Oral, resp. rate 18, SpO2 96 %.There is no weight on file to calculate BMI.  General Appearance: Disheveled  Eye Sport and exercise psychologist::  Fair  Speech:  Clear and Coherent and Slow  Volume:  Decreased  Mood:  Anxious, Depressed and Worthless  Affect:  Constricted and Depressed  Thought Process:  Coherent and Goal Directed  Orientation:  Full (Time, Place, and Person)  Thought Content:  Rumination  Suicidal Thoughts:  No  Homicidal Thoughts:  No  Memory:  Immediate;    Fair Recent;   Fair  Judgement:  Fair  Insight:  Fair  Psychomotor Activity:  Decreased  Concentration:  Fair  Recall:  [employer] of Knowledge:Good  Language: Good  Akathisia:  Negative  Handed:  Right  AIMS (if indicated):     Assets:  Communication Skills Desire for Improvement Leisure Time Resilience  ADL's:  Impaired  Cognition: WNL  Sleep:      Medical Decision Making: Review of Psycho-Social Stressors (1), Review or order clinical lab tests (1), Review and summation of old records (2), Established Problem, Worsening (2), Review of Last Therapy Session (1), Review or order medicine tests (1), Review of Medication Regimen & Side Effects (2) and Review of New Medication or Change in Dosage (2)  Treatment Plan Summary: Daily contact with patient to assess and evaluate symptoms and progress in treatment and Medication management  Plan: Continue citalopram 20 mg daily for depression anxiety  Continue Trazodone 100 mg at bedtime for insomnia Monitor for alcohol withdrawal symptoms and seizures Continue alcohol detox treatment has ordered including CIWA protocol Patient does not meet criteria for psychiatric inpatient admission. Supportive therapy provided about ongoing stressors.  Appreciate psychiatric consultation and  follow up as clinically required Please contact [phone] or [phone] if needs further assistance  Disposition: Patient may be referred to the outpatient psychiatric services and medically stable as patient does not meet criteria for inpatient hospitalization at behavioral [place]. 04/18/2014 1:02 PM

## 2014-04-18 NOTE — Progress Notes (Signed)
PROGRESS NOTE  ANUAR WALGREN BZJ:[id number] DOB: 1980/04/07 DOA: 04/16/2014 PCP: No PCP Per Patient    HPI/Brief narrative 34 y.o. male, separated, unemployed, ?homeless, PMH of alcohol dependance, tobacco abuse, PTSD, depression & anxiety presented to the [facility] ED on 04/16/2014 seeking detox from alcohol. In the ED, patient was found to be tachycardic, lab work unremarkable except blood alcohol level 263. Urine microscopy and UDS: Negative. He was admitted for alcohol withdrawal   Assessment/Plan:  Alcohol dependence with uncomplicated withdrawal - At risk for severe withdrawal/DTs - Admitted to telemetry - Started CIWA protocol including scheduled Ativan. - Psychiatry consultation appreciated. - Continue management.   Alcohol dependence - Management as above.   Tobacco abuse - Cessation counseled. - Nicotine patch   Major depressive disorder, recurrent episode, moderate - Patient denies SI/HI. - States that the medications he was briefly on post discharge from [facility] in January but he lost the medications. - Psychiatry consultation appreciated - Psychiatry starting citalopram for depression & anxiety and trazodone at bedtime for insomnia   Panic attacks/ GAD (generalized anxiety disorder) - Psychiatry consultation appreciated   Tachycardia - Asymptomatic - Secondary to alcohol withdrawal - Management as above -improving   Nausea - Secondary to alcohol withdrawal - PPI - No vomiting-->tolerating diet   ? Homelessness - Clinical social worker consulted - Case management also consulted for outpatient PCP follow-up and medication assistance.  History of seizures - Likely related to alcohol withdraw--no indication for AEDs - None recently per patient - Monitor for seizures  Mild anemia - Hemoglobin dropped from 17.4 on admission to 12.8. Lot of this drop may be secondary to hemoconcentration which improved after IV fluids - Hgb  stable after initial drop -remains hemodynamically stable   Code Status: Full Family Communication: None at bedside Disposition Plan: Home 04/19/14 if stable    Procedures/Studies:  No results found.      Subjective: Patient is feeling better today he is feeling less jittery. Denies any fevers, chills, chest pain, sinus breath, vomiting, diarrhea, vomiting, dysuria, hematuria. He is tolerating his diet.  Objective: Filed Vitals:   04/17/14 2214 04/18/14 0556 04/18/14 1020 04/18/14 1413  BP: 104/49 126/67 126/78 122/62  Pulse: 94 73 92 83  Temp: 97.3 F (36.3 C) 97.6 F (36.4 C) 98.2 F (36.8 C) 97.6 F (36.4 C)  TempSrc: Oral Oral Oral Oral  Resp: 18 16 18 18   SpO2: 96% 98% 96% 95%    Intake/Output Summary (Last 24 hours) at 04/18/14 1801 Last data filed at 04/18/14 0900  Gross per 24 hour  Intake   1662 ml  Output      0 ml  Net   1662 ml   Weight change:  Exam:   General:  Pt is alert, follows commands appropriately, not in acute distress  HEENT: No icterus, No thrush, No neck mass, [place]/AT  Cardiovascular: RRR, S1/S2, no rubs, no gallops  Respiratory: CTA bilaterally, no wheezing, no crackles, no rhonchi  Abdomen: Soft/+BS, non tender, non distended, no guarding  Extremities: No edema, No lymphangitis, No petechiae, No rashes, no synovitis  Data Reviewed: Basic Metabolic Panel:  Recent Labs Lab 04/16/14 0932 04/17/14 0525  NA 138 135  K 4.0 3.6  CL 96 103  CO2 27 26  GLUCOSE 130* 92  BUN 12 14  CREATININE 1.00 0.73  CALCIUM 8.5 8.1*   Liver Function Tests:  Recent Labs Lab 04/16/14 0932 04/17/14 0525  AST 32 21  ALT 30 23  ALKPHOS 42 35*  BILITOT 0.7 1.1  PROT 8.6* 6.1  ALBUMIN 5.1 3.7    Recent Labs Lab 04/16/14 0932  LIPASE 21   No results for input(s): AMMONIA in the last 168 hours. CBC:  Recent Labs Lab 04/16/14 0932 04/17/14 0525 04/18/14 0531  WBC 12.2* 9.0 8.1  NEUTROABS 4.9  --   --   HGB 17.4* 12.8* 12.6*   HCT 49.3 36.8* 37.4*  MCV 94.8 95.6 96.9  PLT 338 239 240   Cardiac Enzymes: No results for input(s): CKTOTAL, CKMB, CKMBINDEX, TROPONINI in the last 168 hours. BNP: Invalid input(s): POCBNP CBG: No results for input(s): GLUCAP in the last 168 hours.  No results found for this or any previous visit (from the past 240 hour(s)).   Scheduled Meds: . citalopram  20 mg Oral Daily  . enoxaparin (LOVENOX) injection  40 mg Subcutaneous Q24H  . folic acid  1 mg Oral Daily  . LORazepam  0-4 mg Oral Q12H  . multivitamin with minerals  1 tablet Oral Daily  . nicotine  21 mg Transdermal Daily  . pantoprazole  40 mg Oral Daily  . sodium chloride  3 mL Intravenous Q12H  . thiamine  100 mg Oral Daily   Or  . thiamine  100 mg Intravenous Daily  . traZODone  100 mg Oral QHS   Continuous Infusions:    Kaylon Laroche, DO  [facility] Pager [phone]  If 7PM-7AM, please contact night-coverage [email or web address] Password [email or web address] 04/18/2014, 6:01 PM   LOS: 2 days

## 2014-04-19 MED ORDER — TRAZODONE HCL 100 MG PO TABS: 100.0000 mg | ORAL_TABLET | Freq: Every day | ORAL | Status: DC

## 2014-04-19 MED ORDER — CITALOPRAM HYDROBROMIDE 20 MG PO TABS: 20.0000 mg | ORAL_TABLET | Freq: Every day | ORAL | Status: DC

## 2014-04-19 NOTE — Progress Notes (Signed)
Discharge instructions and medications reviewed with patient. Patient verbalizes understanding and has no questions at this time. Patient confirms he has all personal belongings in his possession. Patient discharged home. 

## 2014-04-19 NOTE — Discharge Summary (Signed)
Physician Discharge Summary  YOGESH COMINSKY NWG:[id number] DOB: 1980/01/12 DOA: 04/16/2014  PCP: No PCP Per Patient  Admit date: 04/16/2014 Discharge date: 04/19/2014  Recommendations for Outpatient Follow-up:  1. Pt will need to follow up with PCP in 2 weeks post discharge 2. Please obtain BMP in 2 weeks  Discharge Diagnoses:   Alcohol dependence with uncomplicated withdrawal - no signs of DTs for >24hrs prior to d/c - Admitted to telemetry--no concerning dyrhythmias - Started CIWA protocol including scheduled Ativan.--CIWA <8 on day of d/c - Psychiatry consultation appreciated--recommended outpt E[facility] rehab--resources and contact information provided to the patient   Alcohol dependence - Management as above.   Tobacco abuse - Cessation counseled. - Nicotine patch   Major depressive disorder, recurrent episode, moderate - Patient denies SI/HI. - States that the medications he was briefly on post discharge from [facility] in January but he lost the medications. - Psychiatry consultation appreciated - Psychiatry starting citalopram for depression & anxiety and trazodone at bedtime for insomnia   Panic attacks/ GAD (generalized anxiety disorder) - Psychiatry consultation appreciated--celexa and trazadone as above   Tachycardia - Asymptomatic - Secondary to alcohol withdrawal - Management as above -improved and without tachycardia >24 hours prior to d/c   Nausea - Secondary to alcohol withdrawal - PPI - No vomiting-->tolerating diet   ? Homelessness - Clinical social worker consulted - Case management also consulted for outpatient PCP follow-up and medication assistance.  History of seizures - Likely related to alcohol withdraw--no indication for AEDs - None recently per patient - Monitor for seizures--no seizures throughout the hospitalization  Mild anemia - Hemoglobin dropped from 17.4 on admission to 12.8. Lot of this drop may be secondary to hemoconcentration  which improved after IV fluids - Hgb stable after initial drop -remains hemodynamically stable  Discharge Condition: stable  Disposition:  Follow-up Information    Follow up with [place] In 1 week.   Contact information:   [address] [place] [address] [phone]    home  Diet:regular Wt Readings from Last 3 Encounters:  01/20/14 99.791 kg (220 lb)  01/08/14 100.699 kg (222 lb)    History of present illness:  34 y.o. male, separated, unemployed, ?homeless, PMH of alcohol dependance, tobacco abuse, PTSD, depression & anxiety presented to the [facility] ED on 04/16/2014 seeking detox from alcohol. In the ED, patient was found to be tachycardic, lab work unremarkable except blood alcohol level 263. Urine microscopy and UDS: Negative. He was admitted for alcohol withdrawal. Pt was placed on the alcohol withdrawal protocol. Although the patient was tachycardic and jittery initially, he gradually improved. The patient remained clinically stable without signs of delirium tremens for over 24 hours prior to discharge. The patient was seen by psychiatry. They did not feel that the patient needed inpatient behavioral health services. The patient was provided resources in the outpatient setting. Psychiatry recommended starting Celexa and trazodone.    Consultants: Psychiatry--JONNALAGADDA  Discharge Exam: Filed Vitals:   04/19/14 0828  BP: 123/72  Pulse: 91  Temp: 98.6 F (37 C)  Resp: 18   Filed Vitals:   04/18/14 1800 04/18/14 2131 04/19/14 0526 04/19/14 0828  BP: 118/67 115/59 141/89 123/72  Pulse: 78 68 76 91  Temp: 97.9 F (36.6 C) 97.3 F (36.3 C) 98.1 F (36.7 C) 98.6 F (37 C)  TempSrc: Oral Oral Oral Oral  Resp: 18 16 17 18   SpO2: 97% 97% 96% 94%   General: A&O  x 3, NAD, pleasant, cooperative Cardiovascular: RRR, no rub, no gallop, no S3 Respiratory: CTAB, no wheeze, no rhonchi Abdomen:soft,  nontender, nondistended, positive bowel sounds Extremities: No edema, No lymphangitis, no petechiae  Discharge Instructions      Discharge Instructions    Diet - low sodium heart healthy    Complete by:  As directed      Increase activity slowly    Complete by:  As directed             Medication List    TAKE these medications        citalopram 20 MG tablet  Commonly known as:  CELEXA  Take 1 tablet (20 mg total) by mouth daily.     traZODone 100 MG tablet  Commonly known as:  DESYREL  Take 1 tablet (100 mg total) by mouth at bedtime.         The results of significant diagnostics from this hospitalization (including imaging, microbiology, ancillary and laboratory) are listed below for reference.    Significant Diagnostic Studies: No results found.   Microbiology: No results found for this or any previous visit (from the past 240 hour(s)).   Labs: Basic Metabolic Panel:  Recent Labs Lab 04/16/14 0932 04/17/14 0525  NA 138 135  K 4.0 3.6  CL 96 103  CO2 27 26  GLUCOSE 130* 92  BUN 12 14  CREATININE 1.00 0.73  CALCIUM 8.5 8.1*   Liver Function Tests:  Recent Labs Lab 04/16/14 0932 04/17/14 0525  AST 32 21  ALT 30 23  ALKPHOS 42 35*  BILITOT 0.7 1.1  PROT 8.6* 6.1  ALBUMIN 5.1 3.7    Recent Labs Lab 04/16/14 0932  LIPASE 21   No results for input(s): AMMONIA in the last 168 hours. CBC:  Recent Labs Lab 04/16/14 0932 04/17/14 0525 04/18/14 0531  WBC 12.2* 9.0 8.1  NEUTROABS 4.9  --   --   HGB 17.4* 12.8* 12.6*  HCT 49.3 36.8* 37.4*  MCV 94.8 95.6 96.9  PLT 338 239 240   Cardiac Enzymes: No results for input(s): CKTOTAL, CKMB, CKMBINDEX, TROPONINI in the last 168 hours. BNP: Invalid input(s): POCBNP CBG: No results for input(s): GLUCAP in the last 168 hours.  Time coordinating discharge:  Greater than 30 minutes  Signed:  Giles Currie, DO [facility] Pager: [phone] 04/19/2014, 11:14 AM

## 2014-04-22 ENCOUNTER — Ambulatory Visit (HOSPITAL_COMMUNITY)
Admission: RE | Admit: 2014-04-22 | Discharge: 2014-04-22 | Disposition: A | Discharge status: Home / Self Care | Payer: Self-pay | Attending: Psychiatry | Admitting: Psychiatry

## 2014-04-22 ENCOUNTER — Emergency Department (HOSPITAL_COMMUNITY)
Admission: EM | Admit: 2014-04-22 | Discharge: 2014-04-22 | Disposition: A | Discharge status: Home / Self Care | Payer: Self-pay | Attending: Emergency Medicine | Admitting: Emergency Medicine

## 2014-04-22 ENCOUNTER — Encounter (HOSPITAL_COMMUNITY): Payer: Self-pay | Admitting: Emergency Medicine

## 2014-04-22 ENCOUNTER — Emergency Department (HOSPITAL_COMMUNITY)
Admission: EM | Admit: 2014-04-22 | Discharge: 2014-04-23 | Disposition: A | Discharge status: Home / Self Care | Payer: Self-pay | Attending: Emergency Medicine | Admitting: Emergency Medicine

## 2014-04-22 ENCOUNTER — Encounter (HOSPITAL_COMMUNITY): Payer: Self-pay

## 2014-04-22 DIAGNOSIS — Z79899 Other long term (current) drug therapy: Secondary | ICD-10-CM | POA: Insufficient documentation

## 2014-04-22 DIAGNOSIS — G40909 Epilepsy, unspecified, not intractable, without status epilepticus: Secondary | ICD-10-CM | POA: Insufficient documentation

## 2014-04-22 DIAGNOSIS — F1310 Sedative, hypnotic or anxiolytic abuse, uncomplicated: Secondary | ICD-10-CM | POA: Insufficient documentation

## 2014-04-22 DIAGNOSIS — Z720 Tobacco use: Secondary | ICD-10-CM | POA: Insufficient documentation

## 2014-04-22 DIAGNOSIS — F10920 Alcohol use, unspecified with intoxication, uncomplicated: Secondary | ICD-10-CM

## 2014-04-22 DIAGNOSIS — F10120 Alcohol abuse with intoxication, uncomplicated: Secondary | ICD-10-CM | POA: Insufficient documentation

## 2014-04-22 DIAGNOSIS — F10230 Alcohol dependence with withdrawal, uncomplicated: Secondary | ICD-10-CM | POA: Diagnosis present

## 2014-04-22 DIAGNOSIS — F329 Major depressive disorder, single episode, unspecified: Secondary | ICD-10-CM | POA: Insufficient documentation

## 2014-04-22 DIAGNOSIS — R000 Tachycardia, unspecified: Secondary | ICD-10-CM | POA: Insufficient documentation

## 2014-04-22 DIAGNOSIS — F419 Anxiety disorder, unspecified: Secondary | ICD-10-CM | POA: Insufficient documentation

## 2014-04-22 DIAGNOSIS — F39 Unspecified mood [affective] disorder: Secondary | ICD-10-CM | POA: Insufficient documentation

## 2014-04-22 LAB — CBC
HCT: 41 % (ref 39.0–52.0)
HCT: 38.9 % — ABNORMAL LOW (ref 39.0–52.0)
HEMOGLOBIN: 13.5 g/dL (ref 13.0–17.0)
Hemoglobin: 14.3 g/dL (ref 13.0–17.0)
MCH: 32.6 pg (ref 26.0–34.0)
MCH: 32.8 pg (ref 26.0–34.0)
MCHC: 34.9 g/dL (ref 30.0–36.0)
MCHC: 34.7 g/dL (ref 30.0–36.0)
MCV: 93.6 fL (ref 78.0–100.0)
MCV: 94.4 fL (ref 78.0–100.0)
PLATELETS: 260 10*3/uL (ref 150–400)
Platelets: 263 10*3/uL (ref 150–400)
RBC: 4.38 MIL/uL (ref 4.22–5.81)
RBC: 4.12 MIL/uL — AB (ref 4.22–5.81)
RDW: 12.2 % (ref 11.5–15.5)
RDW: 12.3 % (ref 11.5–15.5)
WBC: 8.3 10*3/uL (ref 4.0–10.5)
WBC: 7.1 10*3/uL (ref 4.0–10.5)

## 2014-04-22 LAB — COMPREHENSIVE METABOLIC PANEL
ALBUMIN: 4.1 g/dL (ref 3.5–5.2)
ALK PHOS: 38 U/L — AB (ref 39–117)
ALT: 20 U/L (ref 0–53)
ALT: 20 U/L (ref 0–53)
ANION GAP: 10 (ref 5–15)
AST: 21 U/L (ref 0–37)
AST: 22 U/L (ref 0–37)
Albumin: 4.2 g/dL (ref 3.5–5.2)
Alkaline Phosphatase: 39 U/L (ref 39–117)
Anion gap: 12 (ref 5–15)
BUN: 11 mg/dL (ref 6–23)
BUN: 11 mg/dL (ref 6–23)
CALCIUM: 8.2 mg/dL — AB (ref 8.4–10.5)
CHLORIDE: 100 mmol/L (ref 96–112)
CO2: 28 mmol/L (ref 19–32)
CO2: 27 mmol/L (ref 19–32)
Calcium: 8.6 mg/dL (ref 8.4–10.5)
Chloride: 108 mmol/L (ref 96–112)
Creatinine, Ser: 0.69 mg/dL (ref 0.50–1.35)
Creatinine, Ser: 0.78 mg/dL (ref 0.50–1.35)
GFR calc Af Amer: >90 mL/min (ref >90)
GFR calc non Af Amer: >90 mL/min (ref >90)
GFR calc non Af Amer: >90 mL/min (ref >90)
GLUCOSE: 104 mg/dL — AB (ref 70–99)
Glucose, Bld: 94 mg/dL (ref 70–99)
Potassium: 3.1 mmol/L — ABNORMAL LOW (ref 3.5–5.1)
Potassium: 3.6 mmol/L (ref 3.5–5.1)
SODIUM: 140 mmol/L (ref 135–145)
Sodium: 145 mmol/L (ref 135–145)
Total Bilirubin: 0.3 mg/dL (ref 0.3–1.2)
Total Bilirubin: 0.2 mg/dL — ABNORMAL LOW (ref 0.3–1.2)
Total Protein: 7.2 g/dL (ref 6.0–8.3)
Total Protein: 7.1 g/dL (ref 6.0–8.3)

## 2014-04-22 LAB — RAPID URINE DRUG SCREEN, HOSP PERFORMED
Amphetamines: NOT DETECTED
Barbiturates: NOT DETECTED
Benzodiazepines: POSITIVE — AB
Cocaine: NOT DETECTED
Opiates: NOT DETECTED
TETRAHYDROCANNABINOL: NOT DETECTED

## 2014-04-22 LAB — ETHANOL
ALCOHOL ETHYL (B): 204 mg/dL — AB (ref 0–9)
Alcohol, Ethyl (B): 327 mg/dL — ABNORMAL HIGH (ref 0–9)

## 2014-04-22 LAB — SALICYLATE LEVEL: Salicylate Lvl: <4 mg/dL (ref 2.8–20.0)

## 2014-04-22 LAB — ACETAMINOPHEN LEVEL: Acetaminophen (Tylenol), Serum: <10 ug/mL — ABNORMAL LOW (ref 10–30)

## 2014-04-22 LAB — LIPASE, BLOOD: Lipase: 29 U/L (ref 11–59)

## 2014-04-22 MED ORDER — LORAZEPAM 1 MG PO TABS: 2.0000 mg | ORAL_TABLET | Freq: Once | ORAL | Status: DC

## 2014-04-22 MED ORDER — ONDANSETRON HCL 4 MG/2ML IJ SOLN
4.0000 mg | Freq: Once | INTRAMUSCULAR | Status: AC
  Administered 2014-04-22: 4 mg via INTRAVENOUS
  Filled 2014-04-22: qty 2

## 2014-04-22 MED ORDER — THIAMINE HCL 100 MG/ML IJ SOLN: 100.0000 mg | Freq: Every day | INTRAMUSCULAR | Status: DC

## 2014-04-22 MED ORDER — LORAZEPAM 2 MG/ML IJ SOLN: 0.0000 mg | Freq: Four times a day (QID) | INTRAMUSCULAR | Status: DC

## 2014-04-22 MED ORDER — VITAMIN B-1 100 MG PO TABS
100.0000 mg | ORAL_TABLET | Freq: Every day | ORAL | Status: DC
  Administered 2014-04-22 – 2014-04-23 (×2): 100 mg via ORAL
  Filled 2014-04-22 (×2): qty 1

## 2014-04-22 MED ORDER — LORAZEPAM 1 MG PO TABS
0.0000 mg | ORAL_TABLET | Freq: Two times a day (BID) | ORAL | Status: DC
  Administered 2014-04-23: 2 mg via ORAL
  Filled 2014-04-22: qty 2

## 2014-04-22 MED ORDER — LORAZEPAM 2 MG/ML IJ SOLN: 0.0000 mg | Freq: Two times a day (BID) | INTRAMUSCULAR | Status: DC

## 2014-04-22 MED ORDER — LORAZEPAM 1 MG PO TABS
0.0000 mg | ORAL_TABLET | Freq: Four times a day (QID) | ORAL | Status: DC
Start: 2014-04-22 — End: 2014-04-23
  Administered 2014-04-22: 2 mg via ORAL
  Administered 2014-04-23: 1 mg via ORAL
  Filled 2014-04-22: qty 1
  Filled 2014-04-22: qty 2

## 2014-04-22 MED ORDER — ONDANSETRON 4 MG PO TBDP
8.0000 mg | ORAL_TABLET | Freq: Once | ORAL | Status: AC
  Administered 2014-04-22: 8 mg via ORAL
  Filled 2014-04-22: qty 2

## 2014-04-22 MED ORDER — SODIUM CHLORIDE 0.9 % IV BOLUS (SEPSIS)
2000.0000 mL | Freq: Once | INTRAVENOUS | Status: AC
Start: 2014-04-22 — End: 2014-04-22
  Administered 2014-04-22: 2000 mL via INTRAVENOUS

## 2014-04-22 MED ORDER — LORAZEPAM 2 MG/ML IJ SOLN
2.0000 mg | Freq: Once | INTRAMUSCULAR | Status: AC
  Administered 2014-04-22: 2 mg via INTRAVENOUS
  Filled 2014-04-22: qty 1

## 2014-04-22 MED ORDER — CHLORDIAZEPOXIDE HCL 25 MG PO CAPS: ORAL_CAPSULE | ORAL | Status: DC

## 2014-04-22 MED ORDER — LORAZEPAM 2 MG/ML IJ SOLN
1.0000 mg | Freq: Once | INTRAMUSCULAR | Status: AC
  Administered 2014-04-22: 1 mg via INTRAVENOUS
  Filled 2014-04-22: qty 1

## 2014-04-22 NOTE — ED Notes (Signed)
Awake. Verbally responsive. A/O x4. Resp even and unlabored. No audible adventitious breath sounds noted. ABC's intact. Pt reported being nausea without vomiting.

## 2014-04-22 NOTE — ED Notes (Signed)
Resting quietly with eye closed. Easily arousable. Verbally responsive. Resp even and unlabored. ABC's intact. IV infusing NS at 967ml/hr without difficulty. NAD noted.

## 2014-04-22 NOTE — ED Notes (Addendum)
Pt homeless prsenting to ED c/o alcohol withdrawal. Pt's last drink was a beer a few hours ago. Pt sts he normally drink a half gallon of liquor a day. Pt was seen here "a few days ago" for alcohol detox and given ativan and d/c.  Pt sts "I just want this stuff out of my system." Pt c/o nauseous, tremors. Denies headache at this time but sts "the headache will come." A&Ox4 and ambulatory. Denies having any liquor today.

## 2014-04-22 NOTE — ED Notes (Signed)
He states he is having s.i. Without plan.  He further tells me he is undergoing ETOH withdrawal, with his last drink being about 12 hours ago.

## 2014-04-22 NOTE — ED Notes (Signed)
Pt unable to give urine specimen at this time 

## 2014-04-22 NOTE — ED Notes (Signed)
Awake. Verbally responsive. A/O x4. Resp even and unlabored. No audible adventitious breath sounds noted. ABC's intact. No n/v/d noted.

## 2014-04-22 NOTE — ED Notes (Signed)
Patient cooperative. Denies sI, HI, AVH at present. Rates anxiety and feelings of depression 5/10. C/o feelings "shaky and nauseous".   Encouragement offered. Patient oriented to the unit.  Ativan, B1, and Zofran given.  Q 15 safety checks in place.
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
## 2014-04-22 NOTE — ED Notes (Signed)
Awake. Verbally responsive. A/O x4. Resp even and unlabored. No audible adventitious breath sounds noted. ABC's intact.  

## 2014-04-22 NOTE — Discharge Instructions (Signed)
°Emergency Department Resource Guide °1) Find a Doctor and Pay Out of Pocket °Although you won't have to find out who is covered by your insurance plan, it is a good idea to ask around and get recommendations. You will then need to call the office and see if the doctor you have chosen will accept you as a new patient and what types of options they offer for patients who are self-pay. Some doctors offer discounts or will set up payment plans for their patients who do not have insurance, but you will need to ask so you aren't surprised when you get to your appointment. ° °2) Contact Your Local Health Department °Not all health departments have doctors that can see patients for sick visits, but many do, so it is worth a call to see if yours does. If you don't know where your local health department is, you can check in your phone book. The CDC also has a tool to help you locate your state's health department, and many state websites also have listings of all of their local health departments. ° °3) Find a Walk-in Clinic °If your illness is not likely to be very severe or complicated, you may want to try a walk in clinic. These are popping up all over the country in pharmacies, drugstores, and shopping centers. They're usually staffed by nurse practitioners or physician assistants that have been trained to treat common illnesses and complaints. They're usually fairly quick and inexpensive. However, if you have serious medical issues or chronic medical problems, these are probably not your best option. ° °No Primary Care Doctor: °- Call [facility] at  832-8000 - they can help you locate a primary care doctor that  accepts your insurance, provides certain services, etc. °- Physician Referral Service- 1-800-533-3463 ° °Chronic Pain Problems: °Organization         Address  Phone   Notes  °[facility]  (336) 297-2271 Patients need to be referred by their primary care doctor.  ° °Medication  Assistance: °Organization         Address  Phone   Notes  °[place] [facility] [address] °[place], [place] [address] (336) 641-8030 --Must be a resident of [place] °-- Must have NO insurance coverage whatsoever (no Medicaid/ Medicare, etc.) °-- The pt. MUST have a primary care doctor that directs their care regularly and follows them in the community °  °[facility]  (866) 331-1348   °[employer]  (888) 892-1162   ° °Agencies that provide inexpensive medical care: °Organization         Address  Phone   Notes  °[facility]  (336) 832-8035   °[facility]    (336) 832-7272   °[facility] [address] °[place], [place] [address] (336) 832-4777   °[facility] [address], °[place] (336) 271-4999   °[facility]    (336) 373-0678   °[facility]    (336) 272-1050   °[facility] ° [address], [place] Phone:  (336) 832-4444, Fax:  (336) 832-4440 Hours of Operation:  9 am - 6 pm, M-F.  Also accepts Medicaid/Medicare and self-pay.  °[facility] ° [address], [place] Phone: (336) 832-3150, Fax: (336) 832-3151. Hours of Operation:  8:30 am - 5:30 pm, M-F.  Also accepts Medicaid and self-pay.  °[facility] [place] [address], [place] Phone: (336) 878-6027   °[facility] [address], [place], [place] (336)723-1848, Ext. 123 Mondays & Thursdays: 7-9 AM.  First 15 patients are seen on a first come, first serve basis. °  ° °Medicaid-accepting [place] Providers: ° °Organization         Address  Phone   Notes  °[facility] [address], [place] (336) 641-2100 Also accepts self-pay patients.  °[facility] [address], [place] ° (336) 856-9996   °[facility] [address], [place]  (336) 288-8857   °[facility] [address] [place] [address], [place] (336) 299-7000   °Veita Bland [address], [place]  ° (336) 373-1557 Only accepts [place] Access Medicaid patients after they have their name applied to their card.  ° °Self-Pay (no insurance) in [place]: ° °Organization         Address  Phone   Notes  °Sickle Cell Patients, [facility] [address], [place] (336) 832-1970   °[facility] [address], [place] (336) 832-4400   °[facility] [place] ° [address], [place] (336) 992-4800   °[facility]/Dr. Osei-Bonsu ° [address] [place] [address], [place] or [address], [place] (336) 841-8500 Phone number for both [place] and [place] locations is the same.  °[facility] [address], [place] (336) 299-0000   °[facility] [address] [place] [address], [place] or [address] (336) 852-7530 °(336) 878-2260   °[facility] [address], [place] (336) 350-1642, phone; (336) 294-5005, fax Sees patients 1st and 3rd Saturday of every month.  Must not qualify for public or private insurance (i.e. Medicaid, Medicare, [place] Health Choice, Veterans' Benefits) • Household income should be no more than 200% of the poverty level •The clinic cannot treat you if you are pregnant or think you are pregnant • Sexually transmitted diseases are not treated at the clinic.  ° ° °Dental Care: °Organization         Address  Phone  Notes  °[place] [facility] [address], [place] (336) 641-6152 Accepts children up to age 21 who are enrolled in Medicaid or [place] Health Choice; pregnant women with a Medicaid card; and children who have applied for Medicaid or [place] Health Choice, but were declined, whose parents can pay a reduced fee at time of service.  °[place]  [facility] [place]  [address], [place] (336) 641-7733 Accepts children up to age 21 who are enrolled in Medicaid or [place] Health Choice; pregnant women with a Medicaid card; and children who have applied for Medicaid or [place] Health Choice, but were declined, whose parents can pay a reduced fee at time of service.  °[facility] ° [address], [place] (336) 641-4533 Patients are seen by appointment only. Walk-ins are not accepted. [facility] will see patients 18 years of age and older. °Monday - Tuesday (8am-5pm) °Most Wednesdays (8:30-5pm) °$30 per visit, cash only  °[facility] ° [address], [place] (336) 641-4533 Patients are seen by appointment only. Walk-ins are not accepted. [facility] will see patients 18 years of age and older. °One   Wednesday Evening (Monthly: Volunteer Based).  $30 per visit, cash only  °[facility]  (919) 537-3737 for adults; Children under age 4, call Graduate Pediatric Dentistry at (919) [phone]. Children aged 4-14, please call (919) [phone] to request a pediatric application. ° Dental services are provided in all areas of dental care including fillings, crowns and bridges, complete and partial dentures, implants, gum treatment, root canals, and extractions. Preventive care is also provided. Treatment is provided to both adults and children. °Patients are selected via a lottery and there is often a waiting list. °  °[facility] [address], °[place] ° (336) 763-8833 [email or web address] °  °[facility] [address], [place], [place] (336)723-1848, Ext. 123 Second and Fourth Thursday of each month, opens at 6:30 AM; Clinic ends at 9 AM.  Patients are seen on a first-come first-served basis, and a limited number are seen during each clinic.  ° °[facility] ° [address], [place], [place] (336) 723-7904    Eligibility Requirements °You must have lived in [place], [place], or [place] counties for at least the last three months. °  You cannot be eligible for state or federal sponsored healthcare insurance, including Veterans Administration, Medicaid, or Medicare. °  You generally cannot be eligible for healthcare insurance through your employer.  °  How to apply: °Eligibility screenings are held every Tuesday and Wednesday afternoon from 1:00 pm until 4:00 pm. You do not need an appointment for the interview!  °[facility] [address], [place], [place] 336-631-2330   °[place] [facility]  336-342-8273   °[place] [facility]  336-703-3100   °[facility]  336-570-6415   ° °Behavioral Health Resources in the Community: °Intensive Outpatient Programs °Organization         Address  Phone  Notes  °[facility] [address], [place], [place] 336-878-6098   °[facility] [address], [place], [place] 336-832-9800   °[facility] [address], [place], [place] ° 336-882-2125   °[facility] [address],  °[place], [place] 1-800-853-5163 or 336-641-4981   °Substance Abuse Resources °Organization         Address  Phone  Notes  °[facility]  336-882-2125   °[facility]  336-784-9470   °[facility]  336-285-9073   °[facility]  336-845-3988   °Residential & Outpatient Substance Abuse Program  1-800-659-3381   °Psychological Services °Organization         Address  Phone  Notes  °[facility]  336- 832-9600   °[facility]  336- 378-7881   °[facility] [address], [place] 1-800-853-5163 or 336-641-4981   ° °Mobile Crisis Teams °Organization         Address  Phone  Notes  °[facility], Mobile Crisis Care Unit  1-877-626-1772   °Assertive °[facility] ° [address]  [place], [place] 336-834-9664   °Sharon DeEsch [address] °[place] 336-554-5454   ° °Self-Help/Support Groups °Organization         Address  Phone             Notes  °[facility]  - variety of support groups  336- 373-1402 Call for more information  °Narcotics Anonymous (NA), [facility] [address], °[place]  2 meetings at this location  ° °  Residential Treatment Programs °Organization         Address  Phone  Notes  °[facility] [address],    °[place]  1-866-801-8205   °[facility] ° [address], [place], [place] 704-293-8524   °[facility] [address], [place] 336-845-3988 Admissions: 8am-3pm M-F  °[facility] [address]    °[place], [place] 336-841-1104   °[facility] [address], [place], [place] 336-379-7146   °[facility] [address]  °[place], [place] 336-285-9073   °[facility] Intensive Outpatient [address], [place], [place] 336-852-3033   °[facility] [address]  °[place], [place] 1-877-615-2722 or 336-784-9470   °[facility] [address] [place], [place] 336-227-7417 Accepts Medicaid  °[facility] [address]  °[place] 1-800-659-3381 Substance Abuse/Addiction Treatment  ° °[place] Behavioral Health Resources °Organization         Address  Phone  Notes  °[facility]  (888) 581-9988   °Julie Brannon, PhD [address] [place], [place]   (336) 349-5553 or (336) 951-0000   ° Behavioral   [address] °[place], [place] (336) 349-4454   °[facility] [address], [place], [place] (336) 342-8316 Insurance/Medicaid/sponsorship through [facility]  °[facility] [address]                                    [place], [place] (336) 342-8316 Therapy/tele-psych/case    °[facility] [address]  ° [place], [place] (336) 349-2233    °Dr. Arfeen  (336) 349-4544   °[facility] [place]  [employer] [place] [facility] 1) [address], [place] °2) [address], [place] °3)  [address], [place] (336) 349-3220 °(336) 342-7768 ° °(336) 342-8140   °[place] [facility] (336) 342-1394 or (336) 342-3537 (After Hours)    ° ° °

## 2014-04-22 NOTE — ED Provider Notes (Signed)
CSN: [id number]     Arrival date & time 04/22/14  1148 History   First MD Initiated Contact with Patient 04/22/14 1206     Chief Complaint  Patient presents with  . Alcohol Problem      HPI Pt presents complaining of alcohol abuse and PTSD.  No homicidal or suicidal thoughts.  He states he drank one beer earlier today.  He states she's been trying to cut down on his drinking and feels like he could be an alcohol withdrawals.  He is homeless.  He reports mild nausea and vomiting without diarrhea over the past 24 hours.  Denies abdominal pain.  No chest pain.  No fevers or chills.   Past Medical History  Diagnosis Date  . Seizures   . Anxiety   . Depression   . Alcohol abuse    Past Surgical History  Procedure Laterality Date  . Wisdom tooth extraction     Family History  Problem Relation Age of Onset  . Alcoholism Father    History  Substance Use Topics  . Smoking status: Current Every Day Smoker -- 1.00 packs/day for 16 years    Types: Cigarettes  . Smokeless tobacco: Never Used  . Alcohol Use: Yes     Comment: 1/5 to 1/2 gallon of liquor daily    Review of Systems  All other systems reviewed and are negative.     Allergies  Review of patient's allergies indicates no known allergies.  Home Medications   Prior to Admission medications   Medication Sig Start Date End Date Taking? Authorizing Provider  chlordiazePOXIDE (LIBRIUM) 25 MG capsule 50mg  PO TID x 1D, then 25-50mg  PO BID X 1D, then 25-50mg  PO QD X 1D 04/22/14   Jola Schmidt, MD  citalopram (CELEXA) 20 MG tablet Take 1 tablet (20 mg total) by mouth daily. 04/19/14   Orson Eva, MD  traZODone (DESYREL) 100 MG tablet Take 1 tablet (100 mg total) by mouth at bedtime. 04/19/14   Orson Eva, MD   BP 111/49 mmHg  Pulse 104  Temp(Src) 98.1 F (36.7 C) (Oral)  Resp 20  SpO2 97% Physical Exam  Constitutional: He is oriented to person, place, and time. He appears well-developed and well-nourished.  Smells of ETOH   HENT:  Head: Normocephalic and atraumatic.  Eyes: EOM are normal.  Neck: Normal range of motion.  Cardiovascular: Regular rhythm, normal heart sounds and intact distal pulses.   Tachycardia   Pulmonary/Chest: Effort normal and breath sounds normal. No respiratory distress.  Abdominal: Soft. He exhibits no distension. There is no tenderness. There is no rebound and no guarding.  Musculoskeletal: Normal range of motion.  Neurological: He is alert and oriented to person, place, and time.  Skin: Skin is warm and dry.  Psychiatric: He has a normal mood and affect. Judgment normal.  Nursing note and vitals reviewed.   ED Course  Procedures (including critical care time) Labs Review Labs Reviewed  COMPREHENSIVE METABOLIC PANEL - Abnormal; Notable for the following:    Potassium 3.1 (*)    Calcium 8.2 (*)    All other components within normal limits  ETHANOL - Abnormal; Notable for the following:    Alcohol, Ethyl (B) 327 (*)    All other components within normal limits  CBC  LIPASE, BLOOD    Imaging Review No results found.   EKG Interpretation None      MDM   Final diagnoses:  Alcohol intoxication, uncomplicated     5:39 PM No  signs of withdrawal. Dc home on librium with outpatient resources. etoh intoxication in ER  Jola Schmidt, MD 04/22/14 1517

## 2014-04-22 NOTE — BH Assessment (Signed)
Informed Dr. Ralene Bathe that pt was assessed at [facility] and inpatient treatment has been recommended.TTS will contact other facilities for placement.

## 2014-04-22 NOTE — ED Notes (Signed)
Awake. Given sandwich and drink. Pt tolerating well.

## 2014-04-22 NOTE — ED Provider Notes (Signed)
CSN: [id number]     Arrival date & time 04/22/14  1823 History   First MD Initiated Contact with Patient 04/22/14 1915     Chief Complaint  Patient presents with  . Suicidal    The history is provided by the patient. No language interpreter was used.   Chad Morgan presents for evaluation for alcohol detox. He states that he has a long-standing history of alcohol abuse and drinks about a gallon of liquor daily. He last drank 12 hours ago. He has a history of EtOH withdrawals. He had a seizure a few years ago due to withdrawals. He reports nausea and vomiting today. He denies any pain. When asked why he wants to stop drinking he cannot give any answer he just says that he doesn't want to do this anymore. He was evaluated in the emergency department earlier today and discharged with resource information. He presented to behavioral health who referred him to the emergency department for behavioral health admission. He denies any additional alcohol since leaving the department but he does endorse vomiting since leaving the department. On my questioning he denies any active SI or history of SI. On triage he did endorse vague SI. He is in the emergency Department voluntarily. He states he feels very tremulous. He denies any hallucinations. Symptoms are severe, constant, worsening.  Past Medical History  Diagnosis Date  . Seizures   . Anxiety   . Depression   . Alcohol abuse    Past Surgical History  Procedure Laterality Date  . Wisdom tooth extraction     Family History  Problem Relation Age of Onset  . Alcoholism Father    History  Substance Use Topics  . Smoking status: Current Every Day Smoker -- 1.00 packs/day for 16 years    Types: Cigarettes  . Smokeless tobacco: Never Used  . Alcohol Use: Yes     Comment: 1/5 to 1/2 gallon of liquor daily    Review of Systems  All other systems reviewed and are negative.     Allergies  Review of patient's allergies indicates no known  allergies.  Home Medications   Prior to Admission medications   Medication Sig Start Date End Date Taking? Authorizing Provider  chlordiazePOXIDE (LIBRIUM) 25 MG capsule 50mg  PO TID x 1D, then 25-50mg  PO BID X 1D, then 25-50mg  PO QD X 1D 04/22/14   Jola Schmidt, MD  citalopram (CELEXA) 20 MG tablet Take 1 tablet (20 mg total) by mouth daily. 04/19/14   Orson Eva, MD  traZODone (DESYREL) 100 MG tablet Take 1 tablet (100 mg total) by mouth at bedtime. 04/19/14   Orson Eva, MD   BP 124/76 mmHg  Pulse 89  Temp(Src) 99.1 F (37.3 C) (Oral)  Resp 14  SpO2 95% Physical Exam  Constitutional: He is oriented to person, place, and time. He appears well-developed and well-nourished.  HENT:  Head: Normocephalic and atraumatic.  Cardiovascular: Regular rhythm.   No murmur heard. Tachycardic  Pulmonary/Chest: Effort normal and breath sounds normal. No respiratory distress.  Abdominal: Soft. There is no tenderness. There is no rebound and no guarding.  Musculoskeletal: He exhibits no edema or tenderness.  Neurological: He is alert and oriented to person, place, and time.  Moves all extremities symmetrically. Mildly tremulous.  Skin: Skin is warm and dry.  Psychiatric:  Flat affect. No active SI or HI. No hallucinations.  Nursing note and vitals reviewed.   ED Course  Procedures (including critical care time) Labs Review Labs Reviewed  ACETAMINOPHEN LEVEL - Abnormal; Notable for the following:    Acetaminophen (Tylenol), Serum <10.0 (*)    All other components within normal limits  CBC - Abnormal; Notable for the following:    RBC 4.12 (*)    HCT 38.9 (*)    All other components within normal limits  COMPREHENSIVE METABOLIC PANEL - Abnormal; Notable for the following:    Glucose, Bld 104 (*)    Alkaline Phosphatase 38 (*)    Total Bilirubin 0.2 (*)    All other components within normal limits  ETHANOL - Abnormal; Notable for the following:    Alcohol, Ethyl (B) 204 (*)    All other  components within normal limits  SALICYLATE LEVEL  URINE RAPID DRUG SCREEN (HOSP PERFORMED)    Imaging Review No results found.   EKG Interpretation None      MDM   Final diagnoses:  Alcohol intoxication, uncomplicated  Mood disorder    Patient with history of EtOH abuse here seeking assistance with EtOH withdrawal. He is made vague statements of suicidal thoughts to nursing staff but denies this with me. He's been evaluated at behavioral health and recommendation has been made for inpatient detox. Patient has been medically cleared for alcohol detox. He has been started on the [place] protocol in the emergency department and will need to be monitored for evidence of withdrawals. His vomiting has improved in the emergency department he's tolerating oral fluids without problems and feeling improved.    Chad Reichert, MD 04/22/14 2249

## 2014-04-23 ENCOUNTER — Encounter (HOSPITAL_COMMUNITY): Payer: Self-pay | Admitting: *Deleted

## 2014-04-23 ENCOUNTER — Inpatient Hospital Stay (HOSPITAL_COMMUNITY)
Admission: AD | Admit: 2014-04-23 | Discharge: 2014-04-27 | DRG: 897 | Disposition: A | Discharge status: Home / Self Care | Payer: Federal, State, Local not specified - Other | Source: Intra-hospital | Attending: Psychiatry | Admitting: Psychiatry

## 2014-04-23 DIAGNOSIS — F10229 Alcohol dependence with intoxication, unspecified: Secondary | ICD-10-CM | POA: Diagnosis present

## 2014-04-23 DIAGNOSIS — F1024 Alcohol dependence with alcohol-induced mood disorder: Secondary | ICD-10-CM | POA: Diagnosis present

## 2014-04-23 DIAGNOSIS — F331 Major depressive disorder, recurrent, moderate: Secondary | ICD-10-CM

## 2014-04-23 DIAGNOSIS — R45851 Suicidal ideations: Secondary | ICD-10-CM

## 2014-04-23 DIAGNOSIS — Y907 Blood alcohol level of 200-239 mg/100 ml: Secondary | ICD-10-CM | POA: Diagnosis present

## 2014-04-23 DIAGNOSIS — F10230 Alcohol dependence with withdrawal, uncomplicated: Principal | ICD-10-CM | POA: Diagnosis present

## 2014-04-23 DIAGNOSIS — F322 Major depressive disorder, single episode, severe without psychotic features: Secondary | ICD-10-CM | POA: Diagnosis present

## 2014-04-23 DIAGNOSIS — Z590 Homelessness: Secondary | ICD-10-CM

## 2014-04-23 DIAGNOSIS — Z720 Tobacco use: Secondary | ICD-10-CM

## 2014-04-23 DIAGNOSIS — F4310 Post-traumatic stress disorder, unspecified: Secondary | ICD-10-CM | POA: Diagnosis present

## 2014-04-23 DIAGNOSIS — F17210 Nicotine dependence, cigarettes, uncomplicated: Secondary | ICD-10-CM | POA: Diagnosis present

## 2014-04-23 DIAGNOSIS — Z811 Family history of alcohol abuse and dependence: Secondary | ICD-10-CM

## 2014-04-23 DIAGNOSIS — F332 Major depressive disorder, recurrent severe without psychotic features: Secondary | ICD-10-CM | POA: Insufficient documentation

## 2014-04-23 MED ORDER — TRAZODONE HCL 100 MG PO TABS
100.0000 mg | ORAL_TABLET | Freq: Every day | ORAL | Status: DC
  Administered 2014-04-23: 100 mg via ORAL
  Filled 2014-04-23 (×3): qty 1

## 2014-04-23 MED ORDER — THIAMINE HCL 100 MG/ML IJ SOLN: 100.0000 mg | Freq: Once | INTRAMUSCULAR | Status: DC

## 2014-04-23 MED ORDER — ONDANSETRON 4 MG PO TBDP
4.0000 mg | ORAL_TABLET | Freq: Three times a day (TID) | ORAL | Status: DC | PRN
  Administered 2014-04-23: 4 mg via ORAL
  Filled 2014-04-23: qty 1

## 2014-04-23 MED ORDER — LORAZEPAM 1 MG PO TABS
ORAL_TABLET | ORAL | Status: AC
  Administered 2014-04-23: 1 mg via ORAL
  Filled 2014-04-23: qty 2

## 2014-04-23 MED ORDER — ONDANSETRON 4 MG PO TBDP
ORAL_TABLET | ORAL | Status: AC
  Filled 2014-04-23: qty 1

## 2014-04-23 MED ORDER — LORAZEPAM 1 MG PO TABS: 0.0000 mg | ORAL_TABLET | Freq: Two times a day (BID) | ORAL | Status: DC

## 2014-04-23 MED ORDER — LORAZEPAM 1 MG PO TABS: 1.0000 mg | ORAL_TABLET | Freq: Three times a day (TID) | ORAL | Status: DC

## 2014-04-23 MED ORDER — HYDROXYZINE HCL 25 MG PO TABS
25.0000 mg | ORAL_TABLET | Freq: Four times a day (QID) | ORAL | Status: DC | PRN
  Administered 2014-04-23 – 2014-04-24 (×2): 25 mg via ORAL
  Filled 2014-04-23 (×3): qty 1

## 2014-04-23 MED ORDER — ONDANSETRON 4 MG PO TBDP
4.0000 mg | ORAL_TABLET | Freq: Four times a day (QID) | ORAL | Status: DC | PRN
  Administered 2014-04-23 (×2): 4 mg via ORAL
  Filled 2014-04-23: qty 1

## 2014-04-23 MED ORDER — CITALOPRAM HYDROBROMIDE 20 MG PO TABS
20.0000 mg | ORAL_TABLET | Freq: Every day | ORAL | Status: DC
Start: 2014-04-23 — End: 2014-04-24
  Administered 2014-04-23 – 2014-04-24 (×2): 20 mg via ORAL
  Filled 2014-04-23 (×5): qty 1

## 2014-04-23 MED ORDER — LORAZEPAM 1 MG PO TABS
1.0000 mg | ORAL_TABLET | Freq: Four times a day (QID) | ORAL | Status: DC | PRN
  Administered 2014-04-23: 1 mg via ORAL
  Filled 2014-04-23: qty 1

## 2014-04-23 MED ORDER — LOPERAMIDE HCL 2 MG PO CAPS: 2.0000 mg | ORAL_CAPSULE | ORAL | Status: DC | PRN

## 2014-04-23 MED ORDER — VITAMIN B-1 100 MG PO TABS: 100.0000 mg | ORAL_TABLET | Freq: Every day | ORAL | Status: DC

## 2014-04-23 MED ORDER — CHLORDIAZEPOXIDE HCL 25 MG PO CAPS: 25.0000 mg | ORAL_CAPSULE | Freq: Every day | ORAL | Status: DC

## 2014-04-23 MED ORDER — LORAZEPAM 1 MG PO TABS: 1.0000 mg | ORAL_TABLET | Freq: Two times a day (BID) | ORAL | Status: DC

## 2014-04-23 MED ORDER — ADULT MULTIVITAMIN W/MINERALS CH
1.0000 | ORAL_TABLET | Freq: Every day | ORAL | Status: DC
  Administered 2014-04-24: 1 via ORAL
  Filled 2014-04-23 (×4): qty 1

## 2014-04-23 MED ORDER — ONDANSETRON 4 MG PO TBDP: 4.0000 mg | ORAL_TABLET | Freq: Three times a day (TID) | ORAL | Status: DC | PRN

## 2014-04-23 MED ORDER — CHLORDIAZEPOXIDE HCL 25 MG PO CAPS
25.0000 mg | ORAL_CAPSULE | Freq: Four times a day (QID) | ORAL | Status: DC
  Administered 2014-04-23 – 2014-04-24 (×3): 25 mg via ORAL
  Filled 2014-04-23 (×3): qty 1

## 2014-04-23 MED ORDER — CHLORDIAZEPOXIDE HCL 25 MG PO CAPS: 25.0000 mg | ORAL_CAPSULE | ORAL | Status: DC

## 2014-04-23 MED ORDER — CHLORDIAZEPOXIDE HCL 25 MG PO CAPS
25.0000 mg | ORAL_CAPSULE | Freq: Four times a day (QID) | ORAL | Status: DC | PRN
  Administered 2014-04-24: 25 mg via ORAL
  Filled 2014-04-23: qty 1

## 2014-04-23 MED ORDER — CHLORDIAZEPOXIDE HCL 25 MG PO CAPS: 25.0000 mg | ORAL_CAPSULE | Freq: Three times a day (TID) | ORAL | Status: DC

## 2014-04-23 MED ORDER — LORAZEPAM 1 MG PO TABS: 1.0000 mg | ORAL_TABLET | Freq: Every day | ORAL | Status: DC

## 2014-04-23 MED ORDER — VITAMIN B-1 100 MG PO TABS
100.0000 mg | ORAL_TABLET | Freq: Every day | ORAL | Status: DC
  Administered 2014-04-24: 100 mg via ORAL
  Filled 2014-04-23 (×3): qty 1

## 2014-04-23 MED ORDER — LORAZEPAM 1 MG PO TABS: 0.0000 mg | ORAL_TABLET | Freq: Four times a day (QID) | ORAL | Status: DC

## 2014-04-23 MED ORDER — LORAZEPAM 1 MG PO TABS
1.0000 mg | ORAL_TABLET | Freq: Four times a day (QID) | ORAL | Status: DC
Start: 2014-04-23 — End: 2014-04-23
  Administered 2014-04-23: 1 mg via ORAL

## 2014-04-23 NOTE — BHH Counselor (Signed)
Per security guard Mickey, pt was actively vomiting upon arrival to [facility]. Pt then ambulated to OBS bed. He is currently sleeping in his bed.   Arnold Long, [place] Therapeutic Triage Specialist

## 2014-04-23 NOTE — BHH Counselor (Signed)
Per [employer], pt has a court date 4/26 for misdemeanor larceny (statute 14-72(A)).  Arnold Long, [place] Therapeutic Triage Specialist

## 2014-04-23 NOTE — BH Assessment (Signed)
[facility] Assessment Progress Note  Per Corena Pilgrim, MD, this pt would benefit from admission to the Observation Unit at [facility].  Debarah Crape, RN, [facility] has assigned pt to Obs 3.  Pt has signed Voluntary Admission and Consent for Treatment, as well as Consent to Release Information, and signed forms have been faxed to [facility].  Pt's nurse, Randall Hiss, has been notified, and agrees to send original paperwork along with pt via [facility], and to call report to [phone].  Jalene Mullet, MA Triage Specialist 04/23/2014 @ 10:40

## 2014-04-23 NOTE — Progress Notes (Signed)
Pt alert and cooperative. Denies SI/HI, -A/[place], verbally contracts for safety. Pt affect/mood anxious and depressed. C/o withdrawal symptoms, see MAR. Emotional support and encouragement given. Will continue to monitor closely and evaluate for stabilization.
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
## 2014-04-23 NOTE — Plan of Care (Signed)
[place] Observation Crisis Plan  Reason for Crisis Plan:  Substance Abuse   Plan of Care:  Referral for Substance Abuse  Family Support:      Current Living Environment:  Living Arrangements: Other (Comment) (unsure)  Insurance:   Hospital Account    Name Acct ID Class Status Primary Coverage   Chad Morgan, Chad Morgan [id number] [place] [facility] - [place] [address]        Guarantor Account (for Hospital Account [id number])    Name Relation to Pt Service Area Active? Acct Type   Morgan, [place]   Address Phone       [place], [place] [address] [phone])          Coverage Information (for Hospital Account [id number])    F/O Payor/Plan Precert #   [place] [facility]/[place] #   Chad Morgan, Chad Morgan    Address Phone   [address] [place], [place] [address] [phone]      Legal Guardian:     Primary Care Provider:  No PCP Per Patient  Current Outpatient Providers:  none  Psychiatrist:     Counselor/Therapist:     Compliant with Medications:  No  Additional Information:   Chad Morgan 4/18/201612:39 PM
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
## 2014-04-23 NOTE — Progress Notes (Signed)
[facility] INPATIENT:  Family/Significant Other Suicide Prevention Education  Suicide Prevention Education:  Patient Refusal for Family/Significant Other Suicide Prevention Education: The patient Chad Morgan has refused to provide written consent for family/significant other to be provided Family/Significant Other Suicide Prevention Education during admission and/or prior to discharge.  Physician notified.  [name] 04/23/2014, 12:41 PM

## 2014-04-23 NOTE — BH Assessment (Signed)
Writer called and left voicemail for Chad Morgan, intake admissions staff, for [facility]. Writer left call back # for OBS unit [phone].  Arnold Long, [place] Therapeutic Triage Specialist

## 2014-04-23 NOTE — Consult Note (Signed)
[facility] Psychiatry Consult   Reason for Consult:  Alcohol dependence/detox Referring Physician:  EDP Patient Identification: Chad Morgan MRN:  [id number] Principal Diagnosis: Alcohol dependence with uncomplicated withdrawal Diagnosis:   Patient Active Problem List   Diagnosis Date Noted  . Tobacco abuse [Z72.0] 04/16/2014  . Alcohol withdrawal [F10.239] 04/16/2014  . Alcohol dependence with uncomplicated withdrawal [B35.329]   . Major depressive disorder, recurrent episode, moderate [F33.1]   . Social anxiety disorder [F40.10] 01/09/2014  . Panic attacks [F41.0] 01/09/2014  . GAD (generalized anxiety disorder) [F41.1] 01/09/2014  . Alcohol dependence [F10.20] 01/08/2014    Total Time spent with patient: 45 minutes  Subjective:   Chad Morgan is a 34 y.o. male patient admitted to [facility] observation for stabilization.  HPI:  The patient presented to the ED for alcohol detox.  He typically drinks 2.5 gallons of liquor daily.  His last drink was 20 hours ago with current tremors and sweating; Ativan CIWA Protocol in place.  He denies suicidal/homicidal ideations, hallucinations, and drug abuse.  He was at [facility] once before for alcohol detox and rehab x 1 in [place].  He is unemployed with legal issues due to child support payments, missed his court date Friday due to not having a way to get there. HPI Elements:   Location:  generalized. Quality:  acute. Severity:  moderate. Timing:  constant. Duration:  months. Context:  stressors.  Past Medical History:  Past Medical History  Diagnosis Date  . Seizures   . Anxiety   . Depression   . Alcohol abuse     Past Surgical History  Procedure Laterality Date  . Wisdom tooth extraction     Family History:  Family History  Problem Relation Age of Onset  . Alcoholism Father    Social History:  History  Alcohol Use  . Yes    Comment: 1/5 to 1/2 gallon of liquor daily     History  Drug Use No    Comment: Patient denies      History   Social History  . Marital Status: Legally Separated    Spouse Name: N/A  . Number of Children: N/A  . Years of Education: N/A   Social History Main Topics  . Smoking status: Current Every Day Smoker -- 1.00 packs/day for 16 years    Types: Cigarettes  . Smokeless tobacco: Never Used  . Alcohol Use: Yes     Comment: 1/5 to 1/2 gallon of liquor daily  . Drug Use: No     Comment: Patient denies   . Sexual Activity: Yes   Other Topics Concern  . None   Social History Narrative   Additional Social History:                          Allergies:  No Known Allergies  Labs:  Results for orders placed or performed during the hospital encounter of 04/22/14 (from the past 48 hour(s))  Acetaminophen level     Status: Abnormal   Collection Time: 04/22/14  7:08 PM  Result Value Ref Range   Acetaminophen (Tylenol), Serum <10.0 (L) 10 - 30 ug/mL    Comment:        THERAPEUTIC CONCENTRATIONS VARY SIGNIFICANTLY. A RANGE OF 10-30 ug/mL MAY BE AN EFFECTIVE CONCENTRATION FOR MANY PATIENTS. HOWEVER, SOME ARE BEST TREATED AT CONCENTRATIONS OUTSIDE THIS RANGE. ACETAMINOPHEN CONCENTRATIONS >150 ug/mL AT 4 HOURS AFTER INGESTION AND >50 ug/mL AT 12 HOURS AFTER INGESTION ARE  OFTEN ASSOCIATED WITH TOXIC REACTIONS.   Ethanol (ETOH)     Status: Abnormal   Collection Time: 04/22/14  7:08 PM  Result Value Ref Range   Alcohol, Ethyl (B) 204 (H) 0 - 9 mg/dL    Comment:        LOWEST DETECTABLE LIMIT FOR SERUM ALCOHOL IS 11 mg/dL FOR MEDICAL PURPOSES ONLY   Salicylate level     Status: None   Collection Time: 04/22/14  7:08 PM  Result Value Ref Range   Salicylate Lvl <0.1 2.8 - 20.0 mg/dL  CBC     Status: Abnormal   Collection Time: 04/22/14  7:09 PM  Result Value Ref Range   WBC 7.1 4.0 - 10.5 K/uL   RBC 4.12 (L) 4.22 - 5.81 MIL/uL   Hemoglobin 13.5 13.0 - 17.0 g/dL   HCT 38.9 (L) 39.0 - 52.0 %   MCV 94.4 78.0 - 100.0 fL   MCH 32.8 26.0 - 34.0 pg   MCHC 34.7  30.0 - 36.0 g/dL   RDW 12.3 11.5 - 15.5 %   Platelets 263 150 - 400 K/uL  Comprehensive metabolic panel     Status: Abnormal   Collection Time: 04/22/14  7:09 PM  Result Value Ref Range   Sodium 145 135 - 145 mmol/L   Potassium 3.6 3.5 - 5.1 mmol/L   Chloride 108 96 - 112 mmol/L   CO2 27 19 - 32 mmol/L   Glucose, Bld 104 (H) 70 - 99 mg/dL   BUN 11 6 - 23 mg/dL   Creatinine, Ser 0.78 0.50 - 1.35 mg/dL   Calcium 8.6 8.4 - 10.5 mg/dL   Total Protein 7.1 6.0 - 8.3 g/dL   Albumin 4.1 3.5 - 5.2 g/dL   AST 22 0 - 37 U/L   ALT 20 0 - 53 U/L   Alkaline Phosphatase 38 (L) 39 - 117 U/L   Total Bilirubin 0.2 (L) 0.3 - 1.2 mg/dL   GFR calc non Af Amer >90 >90 mL/min   GFR calc Af Amer >90 >90 mL/min    Comment: (NOTE) The eGFR has been calculated using the CKD EPI equation. This calculation has not been validated in all clinical situations. eGFR's persistently <90 mL/min signify possible Chronic Kidney Disease.    Anion gap 10 5 - 15  Urine Drug Screen     Status: Abnormal   Collection Time: 04/22/14 11:10 PM  Result Value Ref Range   Opiates NONE DETECTED NONE DETECTED   Cocaine NONE DETECTED NONE DETECTED   Benzodiazepines POSITIVE (A) NONE DETECTED   Amphetamines NONE DETECTED NONE DETECTED   Tetrahydrocannabinol NONE DETECTED NONE DETECTED   Barbiturates NONE DETECTED NONE DETECTED    Comment:        DRUG SCREEN FOR MEDICAL PURPOSES ONLY.  IF CONFIRMATION IS NEEDED FOR ANY PURPOSE, NOTIFY LAB WITHIN 5 DAYS.        LOWEST DETECTABLE LIMITS FOR URINE DRUG SCREEN Drug Class       Cutoff (ng/mL) Amphetamine      1000 Barbiturate      200 Benzodiazepine   093 Tricyclics       235 Opiates          300 Cocaine          300 THC              50     Vitals: Blood pressure 108/59, pulse 101, temperature 97.9 F (36.6 C), temperature source Oral, resp. rate 18, SpO2 96 %.  Risk to Self: Is patient at risk for suicide?: Yes Risk to Others:   Prior Inpatient Therapy:   Prior  Outpatient Therapy:    Current Facility-Administered Medications  Medication Dose Route Frequency Provider Last Rate Last Dose  . LORazepam (ATIVAN) tablet 0-4 mg  0-4 mg Oral 4 times per day Quintella Reichert, MD   1 mg at 04/23/14 0539  . LORazepam (ATIVAN) tablet 0-4 mg  0-4 mg Oral Q12H Quintella Reichert, MD   2 mg at 04/23/14 0841  . ondansetron (ZOFRAN-ODT) disintegrating tablet 4 mg  4 mg Oral Q8H PRN Harriet Butte, NP   4 mg at 04/23/14 0544  . thiamine (VITAMIN B-1) tablet 100 mg  100 mg Oral Daily Quintella Reichert, MD   100 mg at 04/23/14 1035   Current Outpatient Prescriptions  Medication Sig Dispense Refill  . chlordiazePOXIDE (LIBRIUM) 25 MG capsule 22m PO TID x 1D, then 25-53mPO BID X 1D, then 25-504mO QD X 1D 10 capsule 0  . citalopram (CELEXA) 20 MG tablet Take 1 tablet (20 mg total) by mouth daily. 30 tablet 1  . traZODone (DESYREL) 100 MG tablet Take 1 tablet (100 mg total) by mouth at bedtime. 30 tablet 1    Musculoskeletal: Strength & Muscle Tone: within normal limits Gait & Station: normal Patient leans: N/A  Psychiatric Specialty Exam:     Blood pressure 108/59, pulse 101, temperature 97.9 F (36.6 C), temperature source Oral, resp. rate 18, SpO2 96 %.There is no weight on file to calculate BMI.  General Appearance: Disheveled  Eye Contact::  Minimal  Speech:  Normal Rate  Volume:  Normal  Mood:  Anxious and Depressed  Affect:  Congruent  Thought Process:  Coherent  Orientation:  Full (Time, Place, and Person)  Thought Content:  WDL  Suicidal Thoughts:  No  Homicidal Thoughts:  No  Memory:  Immediate;   Fair Recent;   Fair Remote;   Fair  Judgement:  Poor  Insight:  Fair  Psychomotor Activity:  Decreased  Concentration:  Fair  Recall:  [employer] Knowledge:Good  Language: Good  Akathisia:  No  Handed:  Right  AIMS (if indicated):     Assets:  Leisure Time Physical Health Resilience  ADL's:  Intact  Cognition: WNL  Sleep:      Medical  Decision Making: Review of Psycho-Social Stressors (1), Review or order clinical lab tests (1) and Review of Medication Regimen & Side Effects (2)  Treatment Plan Summary: Daily contact with patient to assess and evaluate symptoms and progress in treatment and Medication management  Plan:  Supportive therapy provided about ongoing stressors. Dis[facility] for stabilization  LORWaylan BogaMHSyracuse18/2016 10:48 AM Patient seen face-to-face for psychiatric evaluation, chart reviewed and case discussed with the physician extender and developed treatment plan. Reviewed the information documented and agree with the treatment plan. MojCorena PilgrimD

## 2014-04-24 DIAGNOSIS — R45851 Suicidal ideations: Secondary | ICD-10-CM | POA: Diagnosis not present

## 2014-04-24 DIAGNOSIS — F10230 Alcohol dependence with withdrawal, uncomplicated: Secondary | ICD-10-CM | POA: Diagnosis present

## 2014-04-24 DIAGNOSIS — F17210 Nicotine dependence, cigarettes, uncomplicated: Secondary | ICD-10-CM | POA: Diagnosis present

## 2014-04-24 DIAGNOSIS — F1024 Alcohol dependence with alcohol-induced mood disorder: Secondary | ICD-10-CM | POA: Diagnosis present

## 2014-04-24 DIAGNOSIS — Y907 Blood alcohol level of 200-239 mg/100 ml: Secondary | ICD-10-CM | POA: Diagnosis present

## 2014-04-24 DIAGNOSIS — F331 Major depressive disorder, recurrent, moderate: Secondary | ICD-10-CM | POA: Diagnosis not present

## 2014-04-24 DIAGNOSIS — F10229 Alcohol dependence with intoxication, unspecified: Secondary | ICD-10-CM

## 2014-04-24 DIAGNOSIS — F322 Major depressive disorder, single episode, severe without psychotic features: Secondary | ICD-10-CM | POA: Diagnosis present

## 2014-04-24 DIAGNOSIS — F332 Major depressive disorder, recurrent severe without psychotic features: Secondary | ICD-10-CM | POA: Diagnosis not present

## 2014-04-24 DIAGNOSIS — Z811 Family history of alcohol abuse and dependence: Secondary | ICD-10-CM | POA: Diagnosis not present

## 2014-04-24 DIAGNOSIS — Z590 Homelessness: Secondary | ICD-10-CM | POA: Diagnosis not present

## 2014-04-24 MED ORDER — LOPERAMIDE HCL 2 MG PO CAPS: 4.0000 mg | ORAL_CAPSULE | ORAL | Status: DC | PRN

## 2014-04-24 MED ORDER — CHLORDIAZEPOXIDE HCL 25 MG PO CAPS: 25.0000 mg | ORAL_CAPSULE | ORAL | Status: DC

## 2014-04-24 MED ORDER — VITAMIN B-1 100 MG PO TABS
100.0000 mg | ORAL_TABLET | Freq: Every day | ORAL | Status: DC
  Administered 2014-04-25 – 2014-04-27 (×3): 100 mg via ORAL
  Filled 2014-04-24 (×5): qty 1

## 2014-04-24 MED ORDER — NICOTINE 21 MG/24HR TD PT24
21.0000 mg | MEDICATED_PATCH | Freq: Every day | TRANSDERMAL | Status: DC
  Administered 2014-04-25 – 2014-04-27 (×3): 21 mg via TRANSDERMAL
  Filled 2014-04-24 (×7): qty 1

## 2014-04-24 MED ORDER — CITALOPRAM HYDROBROMIDE 20 MG PO TABS
20.0000 mg | ORAL_TABLET | Freq: Every day | ORAL | Status: DC
  Administered 2014-04-25 – 2014-04-27 (×3): 20 mg via ORAL
  Filled 2014-04-24: qty 4
  Filled 2014-04-24 (×3): qty 1
  Filled 2014-04-24: qty 4
  Filled 2014-04-24: qty 1

## 2014-04-24 MED ORDER — ACETAMINOPHEN 325 MG PO TABS
650.0000 mg | ORAL_TABLET | Freq: Four times a day (QID) | ORAL | Status: DC | PRN
  Administered 2014-04-24 – 2014-04-25 (×2): 650 mg via ORAL
  Filled 2014-04-24 (×2): qty 2

## 2014-04-24 MED ORDER — CHLORDIAZEPOXIDE HCL 25 MG PO CAPS: 25.0000 mg | ORAL_CAPSULE | Freq: Three times a day (TID) | ORAL | Status: DC

## 2014-04-24 MED ORDER — CHLORDIAZEPOXIDE HCL 25 MG PO CAPS: 25.0000 mg | ORAL_CAPSULE | Freq: Every day | ORAL | Status: DC

## 2014-04-24 MED ORDER — CHLORDIAZEPOXIDE HCL 25 MG PO CAPS
25.0000 mg | ORAL_CAPSULE | Freq: Four times a day (QID) | ORAL | Status: DC
  Administered 2014-04-24: 25 mg via ORAL
  Filled 2014-04-24 (×2): qty 1

## 2014-04-24 MED ORDER — ALUM & MAG HYDROXIDE-SIMETH 200-200-20 MG/5ML PO SUSP: 30.0000 mL | ORAL | Status: DC | PRN

## 2014-04-24 MED ORDER — CHLORDIAZEPOXIDE HCL 25 MG PO CAPS
50.0000 mg | ORAL_CAPSULE | Freq: Four times a day (QID) | ORAL | Status: DC
  Administered 2014-04-24 – 2014-04-25 (×4): 50 mg via ORAL
  Filled 2014-04-24 (×4): qty 2

## 2014-04-24 MED ORDER — CHLORDIAZEPOXIDE HCL 25 MG PO CAPS
25.0000 mg | ORAL_CAPSULE | Freq: Four times a day (QID) | ORAL | Status: AC | PRN
  Administered 2014-04-25 – 2014-04-26 (×2): 25 mg via ORAL
  Filled 2014-04-24 (×2): qty 1

## 2014-04-24 MED ORDER — LOPERAMIDE HCL 2 MG PO CAPS
2.0000 mg | ORAL_CAPSULE | ORAL | Status: AC | PRN
Start: 2014-04-24 — End: 2014-04-26
  Administered 2014-04-24: 4 mg via ORAL
  Filled 2014-04-24: qty 2

## 2014-04-24 MED ORDER — ONDANSETRON 4 MG PO TBDP
4.0000 mg | ORAL_TABLET | Freq: Four times a day (QID) | ORAL | Status: AC | PRN
  Administered 2014-04-24 – 2014-04-25 (×4): 4 mg via ORAL
  Filled 2014-04-24 (×4): qty 1

## 2014-04-24 MED ORDER — HYDROXYZINE HCL 25 MG PO TABS
25.0000 mg | ORAL_TABLET | Freq: Four times a day (QID) | ORAL | Status: DC | PRN
  Administered 2014-04-24 – 2014-04-26 (×5): 25 mg via ORAL
  Filled 2014-04-24 (×5): qty 1

## 2014-04-24 MED ORDER — TRAZODONE HCL 100 MG PO TABS
100.0000 mg | ORAL_TABLET | Freq: Every day | ORAL | Status: DC
  Administered 2014-04-24 – 2014-04-26 (×3): 100 mg via ORAL
  Filled 2014-04-24: qty 1
  Filled 2014-04-24: qty 4
  Filled 2014-04-24 (×3): qty 1
  Filled 2014-04-24: qty 4
  Filled 2014-04-24: qty 1

## 2014-04-24 MED ORDER — MAGNESIUM HYDROXIDE 400 MG/5ML PO SUSP: 30.0000 mL | Freq: Every day | ORAL | Status: DC | PRN

## 2014-04-24 MED ORDER — ADULT MULTIVITAMIN W/MINERALS CH
1.0000 | ORAL_TABLET | Freq: Every day | ORAL | Status: DC
  Administered 2014-04-25 – 2014-04-27 (×3): 1 via ORAL
  Filled 2014-04-24 (×5): qty 1

## 2014-04-24 NOTE — B[facility] Group Notes (Signed)
Adult Psychoeducational Group Note  Date:  04/24/2014 Time:  10:58 PM  Group Topic/Focus:  Wrap-Up Group:   The focus of this group is to help patients review their daily goal of treatment and discuss progress on daily workbooks.  Participation Level:  Did Not Attend  Participation Quality:  None  Affect:  None  Cognitive:  None  Insight: None  Engagement in Group:  None  Modes of Intervention:  Discussion  Additional Comments:  Chad Morgan did not attend group.  [name] A 04/24/2014, 10:58 PM
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
## 2014-04-24 NOTE — BHH Suicide Risk Assessment (Signed)
[facility] Admission Suicide Risk Assessment   Nursing information obtained from:  Patient Demographic factors:  Male Current Mental Status:  Suicidal ideation indicated by patient Loss Factors:  Financial problems / change in socioeconomic status Historical Factors:  Prior suicide attempts, Family history of mental illness or substance abuse Risk Reduction Factors:  NA Total Time spent with patient: 45 minutes Principal Problem: Alcohol dependence with intoxication with complication Diagnosis:   Patient Active Problem List   Diagnosis Date Noted  . Severe major depression without psychotic features [F32.2] 04/24/2014    Priority: High  . Suicidal ideations [R45.851]     Priority: High  . Alcohol dependence with intoxication with complication [F75.102] 58/52/7782    Priority: High  . Social anxiety disorder [F40.10] 01/09/2014    Priority: High  . Panic attacks [F41.0] 01/09/2014    Priority: High  . Alcohol dependence with withdrawal, uncomplicated [U23.536] 14/43/1540  . Tobacco abuse [Z72.0] 04/16/2014  . Alcohol withdrawal [F10.239] 04/16/2014  . Alcohol dependence with uncomplicated withdrawal [G86.761]   . Major depressive disorder, recurrent episode, moderate [F33.1]   . GAD (generalized anxiety disorder) [F41.1] 01/09/2014  . Alcohol dependence [F10.20] 01/08/2014     Continued Clinical Symptoms:  Alcohol Use Disorder Identification Test Final Score (AUDIT): 34 The "Alcohol Use Disorders Identification Test", Guidelines for Use in Primary Care, Second Edition.  World Pharmacologist [facility] [place]). Score between 0-7:  no or low risk or alcohol related problems. Score between 8-15:  moderate risk of alcohol related problems. Score between 16-19:  high risk of alcohol related problems. Score 20 or above:  warrants further diagnostic evaluation for alcohol dependence and treatment.   CLINICAL FACTORS:   Panic Attacks Depression:   Comorbid alcohol  abuse/dependence Hopelessness Insomnia Severe Alcohol/Substance Abuse/Dependencies   Psychiatric Specialty Exam: Physical Exam  ROS  Blood pressure 145/73, pulse 70, temperature 98.1 F (36.7 C), temperature source Oral, resp. rate 16, height 6\' 2"  (1.88 m), weight 108.863 kg (240 lb), SpO2 95 %.Body mass index is 30.8 kg/(m^2).   COGNITIVE FEATURES THAT CONTRIBUTE TO RISK:  Closed-mindedness, Loss of executive function, Polarized thinking and Thought constriction (tunnel vision)    SUICIDE RISK:   Moderate:  Frequent suicidal ideation with limited intensity, and duration, some specificity in terms of plans, no associated intent, good self-control, limited dysphoria/symptomatology, some risk factors present, and identifiable protective factors, including available and accessible social support.  PLAN OF CARE: Supportive approach/coping skills/relapse prevention                               Detox and reassess and address the co morbities  Medical Decision Making:  Review of Psycho-Social Stressors (1), Review or order clinical lab tests (1), Review of Medication Regimen & Side Effects (2) and Review of New Medication or Change in Dosage (2)  I certify that inpatient services furnished can reasonably be expected to improve the patient's condition.   Kathleen A 04/24/2014, 7:41 PM

## 2014-04-24 NOTE — BH Assessment (Signed)
Per Ivin Booty at [facility], referral may be in [facility] direct fax queue. Please call back at 8:30 to ask Melissa.   Per Patsy at [facility], referral has been received and is under review. She will call us back.   Ruben Reason, MA, LPCA, [facility]

## 2014-04-24 NOTE — BHH Counselor (Signed)
Per Fraser Din at [facility], referral was not received or couldn't be located. Referral was re-sent to [facility] at 06:00 on 4/19.   Referral also sent to [facility] on 4/19 at 06:00.  TTS to follow-up with referrals on day shift.   Ramond Dial, [facility] Triage Specialist

## 2014-04-24 NOTE — Discharge Summary (Signed)
Physician Discharge Summary Note  Patient:  Chad Morgan is an 34 y.o., male MRN:  [id number] DOB:  28-Mar-1980 Patient phone:  [phone] (home)  Patient address:   [place] [address],  Total Time spent with patient: 30 minutes  Date of Admission:  04/23/2014 Date of Discharge: 04/24/2014  Reason for Admission:  Suicidal ideations  Principal Problem: Alcohol dependence with intoxication with complication Discharge Diagnoses: Patient Active Problem List   Diagnosis Date Noted  . Alcohol dependence with withdrawal, uncomplicated [S17.793] 90/30/0923    Priority: High  . Alcohol dependence with intoxication with complication [R00.762] 26/33/3545  . Tobacco abuse [Z72.0] 04/16/2014  . Alcohol withdrawal [F10.239] 04/16/2014  . Alcohol dependence with uncomplicated withdrawal [G25.638]   . Major depressive disorder, recurrent episode, moderate [F33.1]   . Social anxiety disorder [F40.10] 01/09/2014  . Panic attacks [F41.0] 01/09/2014  . GAD (generalized anxiety disorder) [F41.1] 01/09/2014  . Alcohol dependence [F10.20] 01/08/2014    Musculoskeletal: Strength & Muscle Tone: within normal limits Gait & Station: normal Patient leans: N/A  Psychiatric Specialty Exam: Physical Exam  Review of Systems  Constitutional: Positive for chills and diaphoresis.  HENT: Negative.   Eyes: Negative.   Respiratory: Negative.   Cardiovascular: Negative.   Gastrointestinal: Positive for nausea.  Musculoskeletal: Negative.   Skin: Negative.   Neurological: Negative.   Endo/Heme/Allergies: Negative.   Psychiatric/Behavioral: Positive for depression, suicidal ideas and substance abuse.    Blood pressure 100/60, pulse 62, temperature 98.1 F (36.7 C), temperature source Oral, resp. rate 16, height _0  (1.88 m), weight 240 lb (108.863 kg), SpO2 95 %.Body mass index is 30.8 kg/(m^2).  General Appearance: Disheveled  Eye Sport and exercise psychologist::  Fair  Speech:  Normal Rate   Volume:  Decreased  Mood:  Depressed  Affect:  Congruent  Thought Process:  Coherent  Orientation:  Full (Time, Place, and Person)  Thought Content:  Rumination  Suicidal Thoughts:  Yes.  with intent/plan  Homicidal Thoughts:  No  Memory:  Immediate;   Fair Recent;   Fair Remote;   Fair  Judgement:  Impaired  Insight:  Fair  Psychomotor Activity:  Decreased  Concentration:  Fair  Recall:  [employer] of Knowledge:Good  Language: Good  Akathisia:  No  Handed:  Right  AIMS (if indicated):     Assets:  Leisure Time Physical Health Resilience  ADL's:  Intact  Cognition: WNL  Sleep:      Past Medical History:  Past Medical History  Diagnosis Date  . Seizures   . Anxiety   . Depression   . Alcohol abuse     Past Surgical History  Procedure Laterality Date  . Wisdom tooth extraction     Family History:  Family History  Problem Relation Age of Onset  . Alcoholism Father    Social History:  History  Alcohol Use  . Yes    Comment: 1/5 to 1/2 gallon of liquor daily     History  Drug Use No    Comment: Patient denies     History   Social History  . Marital Status: Legally Separated    Spouse Name: N/A  . Number of Children: N/A  . Years of Education: N/A   Social History Main Topics  . Smoking status: Current Every Day Smoker -- 1.00 packs/day for 16 years    Types: Cigarettes  . Smokeless tobacco: Never Used  . Alcohol Use: Yes     Comment: 1/5 to 1/2 gallon  of liquor daily  . Drug Use: No     Comment: Patient denies   . Sexual Activity: Yes   Other Topics Concern  . None   Social History Narrative    Past Psychiatric History: Hospitalizations:  Outpatient Care:  Substance Abuse Care:  Self-Mutilation:  Suicidal Attempts:  Violent Behaviors:   Risk to Self: Is patient at risk for suicide?: Yes Risk to Others:   Prior Inpatient Therapy:   Prior Outpatient Therapy:    Level of Care:  In-patient pscyhiatry  Hospital Course:  The patient  was admitted to the observation for stabilization from alcohol detox.  However, his withdrawal symptoms continue and his depression has increased.   He is now having suicidal ideations again with a plan to overdose.  Peder transferred to the adult inpatient unit to continue his care.  Consults:  None  Significant Diagnostic Studies:  labs: reviewed on admission, stable  Discharge Vitals:   Blood pressure 100/60, pulse 62, temperature 98.1 F (36.7 C), temperature source Oral, resp. rate 16, height _0  (1.88 m), weight 240 lb (108.863 kg), SpO2 95 %. Body mass index is 30.8 kg/(m^2). Lab Results:   Results for orders placed or performed during the hospital encounter of 04/22/14 (from the past 72 hour(s))  Acetaminophen level     Status: Abnormal   Collection Time: 04/22/14  7:08 PM  Result Value Ref Range   Acetaminophen (Tylenol), Serum <10.0 (L) 10 - 30 ug/mL    Comment:        THERAPEUTIC CONCENTRATIONS VARY SIGNIFICANTLY. A RANGE OF 10-30 ug/mL MAY BE AN EFFECTIVE CONCENTRATION FOR MANY PATIENTS. HOWEVER, SOME ARE BEST TREATED AT CONCENTRATIONS OUTSIDE THIS RANGE. ACETAMINOPHEN CONCENTRATIONS >150 ug/mL AT 4 HOURS AFTER INGESTION AND >50 ug/mL AT 12 HOURS AFTER INGESTION ARE OFTEN ASSOCIATED WITH TOXIC REACTIONS.   Ethanol (ETOH)     Status: Abnormal   Collection Time: 04/22/14  7:08 PM  Result Value Ref Range   Alcohol, Ethyl (B) 204 (H) 0 - 9 mg/dL    Comment:        LOWEST DETECTABLE LIMIT FOR SERUM ALCOHOL IS 11 mg/dL FOR MEDICAL PURPOSES ONLY   Salicylate level     Status: None   Collection Time: 04/22/14  7:08 PM  Result Value Ref Range   Salicylate Lvl <2.2 2.8 - 20.0 mg/dL  CBC     Status: Abnormal   Collection Time: 04/22/14  7:09 PM  Result Value Ref Range   WBC 7.1 4.0 - 10.5 K/uL   RBC 4.12 (L) 4.22 - 5.81 MIL/uL   Hemoglobin 13.5 13.0 - 17.0 g/dL   HCT 38.9 (L) 39.0 - 52.0 %   MCV 94.4 78.0 - 100.0 fL   MCH 32.8 26.0 - 34.0 pg   MCHC 34.7 30.0 -  36.0 g/dL   RDW 12.3 11.5 - 15.5 %   Platelets 263 150 - 400 K/uL  Comprehensive metabolic panel     Status: Abnormal   Collection Time: 04/22/14  7:09 PM  Result Value Ref Range   Sodium 145 135 - 145 mmol/L   Potassium 3.6 3.5 - 5.1 mmol/L   Chloride 108 96 - 112 mmol/L   CO2 27 19 - 32 mmol/L   Glucose, Bld 104 (H) 70 - 99 mg/dL   BUN 11 6 - 23 mg/dL   Creatinine, Ser 0.78 0.50 - 1.35 mg/dL   Calcium 8.6 8.4 - 10.5 mg/dL   Total Protein 7.1 6.0 - 8.3 g/dL  Albumin 4.1 3.5 - 5.2 g/dL   AST 22 0 - 37 U/L   ALT 20 0 - 53 U/L   Alkaline Phosphatase 38 (L) 39 - 117 U/L   Total Bilirubin 0.2 (L) 0.3 - 1.2 mg/dL   GFR calc non Af Amer >90 >90 mL/min   GFR calc Af Amer >90 >90 mL/min    Comment: (NOTE) The eGFR has been calculated using the CKD EPI equation. This calculation has not been validated in all clinical situations. eGFR's persistently <90 mL/min signify possible Chronic Kidney Disease.    Anion gap 10 5 - 15  Urine Drug Screen     Status: Abnormal   Collection Time: 04/22/14 11:10 PM  Result Value Ref Range   Opiates NONE DETECTED NONE DETECTED   Cocaine NONE DETECTED NONE DETECTED   Benzodiazepines POSITIVE (A) NONE DETECTED   Amphetamines NONE DETECTED NONE DETECTED   Tetrahydrocannabinol NONE DETECTED NONE DETECTED   Barbiturates NONE DETECTED NONE DETECTED    Comment:        DRUG SCREEN FOR MEDICAL PURPOSES ONLY.  IF CONFIRMATION IS NEEDED FOR ANY PURPOSE, NOTIFY LAB WITHIN 5 DAYS.        LOWEST DETECTABLE LIMITS FOR URINE DRUG SCREEN Drug Class       Cutoff (ng/mL) Amphetamine      1000 Barbiturate      200 Benzodiazepine   920 Tricyclics       100 Opiates          300 Cocaine          300 THC              50     Physical Findings: AIMS: Facial and Oral Movements Muscles of Facial Expression: None, normal Lips and Perioral Area: None, normal Jaw: None, normal Tongue: None, normal,Extremity Movements Upper (arms, wrists, hands, fingers): None,  normal Lower (legs, knees, ankles, toes): None, normal, Trunk Movements Neck, shoulders, hips: None, normal, Overall Severity Severity of abnormal movements (highest score from questions above): None, normal Incapacitation due to abnormal movements: None, normal Patient's awareness of abnormal movements (rate only patient's report): No Awareness, Dental Status Current problems with teeth and/or dentures?: No Does patient usually wear dentures?: No  CIWA:  CIWA-Ar Total: 7 COWS:      See Psychiatric Specialty Exam and Suicide Risk Assessment completed by Attending Physician prior to discharge.  Discharge destination:  Other:  [facility] inpatient unit  Is patient on multiple antipsychotic therapies at discharge:  No   Has Patient had three or more failed trials of antipsychotic monotherapy by history:  No    Recommended Plan for Multiple Antipsychotic Therapies: NA     Medication List    TAKE these medications      Indication   citalopram 20 MG tablet  Commonly known as:  CELEXA  Take 1 tablet (20 mg total) by mouth daily.      traZODone 100 MG tablet  Commonly known as:  DESYREL  Take 1 tablet (100 mg total) by mouth at bedtime.          Follow-up recommendations:  Activity:  as tolerated Diet:  heart healthy diet  Comments:  The patient was transferred to the adult inpatient unit for stabilization, suicidal ideations.  Total Discharge Time: 30 minutes  Signed: Waylan Boga, PMH-NP 04/24/2014, 9:22 AM

## 2014-04-24 NOTE — BHH Counselor (Signed)
Adult Comprehensive Assessment  Patient ID: KAIMANA LURZ, male DOB: [birth year], 34 y.o. MRN: [id number]  Information Source: Information source: Patient  Current Stressors:  Educational / Learning stressors: None Employment / Job issues: Patient has been unemployed for several months due to substance abuse.  Family Relationships: None Financial / Lack of resources (include bankruptcy): Struggling due to no source of income Housing / Lack of housing: Patient is homeless Physical health (include injuries & life threatening diseases): None Social relationships: Does not like to be around people Substance abuse: Patient reports drinking a fifth to a half gallon on liquor daily Bereavement / Loss: None  Living/Environment/Situation:  Living Arrangements: Alone, Other (Comment) was living with a girl but "I don't plan to go back."  Living conditions (as described by patient or guardian): Homeless How long has patient lived in current situation?: since 2012 What is atmosphere in current home: Temporary/chaotic   Family History:  Marital status: Separated Separated, when?: 2012 What types of issues is patient dealing with in the relationship?: I don't plan to go back to my ex. Pt reports that his gf is an alcoholic as well.  Additional relationship information: N/A Does patient have children?: Yes How many children?: 3 How is patient's relationship with their children?: Okay relationship with children ages 32,9, and 75.   Childhood History:  By whom was/is the patient raised?: Father, Both parents Additional childhood history information: Not good - patient very guarded in his response Description of patient's relationship with caregiver when they were a child: Difficult Patient's description of current relationship with people who raised him/her: Difficult Does patient have siblings?: Yes Number of Siblings: 2 Description of patient's current relationship with siblings: okay  with half brother but not good with step sister Did patient suffer any verbal/emotional/physical/sexual abuse as a child?: No Did patient suffer from severe childhood neglect?: No Has patient ever been sexually abused/assaulted/raped as an adolescent or adult?: No Was the patient ever a victim of a crime or a disaster?: No Witnessed domestic violence?: No Has patient been effected by domestic violence as an adult?: No  Education:  Highest grade of school patient has completed: Psychiatrist Currently a student?: No Learning disability?: No  Employment/Work Situation:  Employment situation: Unemployed Patient's job has been impacted by current illness: No What is the longest time patient has a held a job?: Seven years 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 Pt reports that he is not VA connected.  Does patient have a representative payee or guardian?: No  Alcohol/Substance Abuse:  If attempted suicide, did drugs/alcohol play a role in this?: No Alcohol/Substance Abuse Treatment Hx: Past Tx, Inpatient If yes, describe treatment: [facility] by [facility] - several years ago Has alcohol/substance abuse ever caused legal problems?: No  Social Support System:  Heritage manager System: None identified  Describe Community Support System: N/A Type of faith/religion: None How does patient's faith help to cope with current illness?: N/A  Leisure/Recreation:  Leisure and Hobbies: None at this time  Strengths/Needs:  What things does the patient do well?: Good work history. Motivated to get help dealing with cravings, alcoholism, and PTSD.  In what areas does patient struggle / problems for patient: Anxiety and depression; PTSD; sleep.   Discharge Plan:  Does patient have access to transportation?: Yes-bus   Will patient be  returning to same living situation after discharge?: no.  Currently receiving community mental health services: No If no, would patient like referral for services when discharged?: Pt agreeable to being seen at [facility] for [facility] services. He is requesting [facility] referral and has been provided with [facility] and [facility].  Does patient have financial barriers related to discharge medications?: Yes Patient description of barriers related to discharge medications: No income or insurance.  Summary/Recommendations: Iniko Robles is a 34 years old Caucasian male admitted with Alcohol Abuse Disorder, depression, PTSD, SI, and for medication stabilization. Pt currently denies SI/HI/AVH and is homeless due to moving out of gf's home due to "being a bad environment. She drinks too." Pt recently discharged from [facility] for similar issues (12/16).  Recommendations for pt include: crisis stabilization, therapeutic milieu, encourage group attendance and participation, librium taper for withdrawals, medication management for mood stabilization, and development of comprehensive mental wellness/sobriety plan. Pt requesting [facility] referral and [place] list-completed by CSW/given to pt. Pt also agreeable to [facility] referral for outpatient services and [place] [facility] for orange card sign up. CSW assessing for appropriate referrals.   Smart, Aigner Horseman LCSWA 04/24/2014 1:01 PM

## 2014-04-24 NOTE — Progress Notes (Signed)
Pt  Transferred from obs to adult unit.  Comer Locket RN accompanied pt. To search room.
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
## 2014-04-24 NOTE — BHH Group Notes (Signed)
[place] LCSW Group Therapy  04/24/2014 1:32 PM  Type of Therapy:  Group Therapy  Participation Level:  Did Not Attend-pt invited/chose to remain in bed.   Summary of Progress/Problems: [facility] Speaker came to talk about his personal journey with substance abuse and addiction. The pt processed ways by which to relate to the speaker. [place] speaker provided handouts and educational information pertaining to groups and services offered by the [facility].   Smart, Krystalle Pilkington LCSWA 04/24/2014, 1:32 PM

## 2014-04-24 NOTE — Progress Notes (Signed)
Patient continues to experience withdrawal symptoms - sweats, nausea, shakiness and anxiety. CIWA is a "7" and VSS. He is disheveled and states he is unable to rest. Medicated per orders, zofran given prn. Encouraged to push fluids. Patient supported and reassured. On reassess, patient is eating lunch tray. He denies SI/HI and remains safe. Chad Morgan

## 2014-04-24 NOTE — Plan of Care (Signed)
Problem: Ineffective individual coping Goal: STG: Patient will remain free from self harm Outcome: Progressing Patient has not engaged in self harm and remains safe.   Problem: Alteration in mood & ability to function due to Goal: STG-Patient will report withdrawal symptoms Outcome: Progressing Patient verbalizing physical complaints. CIWA - 7

## 2014-04-25 DIAGNOSIS — F4310 Post-traumatic stress disorder, unspecified: Secondary | ICD-10-CM | POA: Diagnosis present

## 2014-04-25 MED ORDER — CHLORDIAZEPOXIDE HCL 25 MG PO CAPS: 25.0000 mg | ORAL_CAPSULE | Freq: Two times a day (BID) | ORAL | Status: DC

## 2014-04-25 MED ORDER — CHLORDIAZEPOXIDE HCL 25 MG PO CAPS: 25.0000 mg | ORAL_CAPSULE | Freq: Three times a day (TID) | ORAL | Status: DC

## 2014-04-25 MED ORDER — ACAMPROSATE CALCIUM 333 MG PO TBEC
666.0000 mg | DELAYED_RELEASE_TABLET | Freq: Three times a day (TID) | ORAL | Status: DC
  Administered 2014-04-25 – 2014-04-27 (×7): 666 mg via ORAL
  Filled 2014-04-25: qty 24
  Filled 2014-04-25: qty 2
  Filled 2014-04-25: qty 24
  Filled 2014-04-25: qty 2
  Filled 2014-04-25: qty 24
  Filled 2014-04-25 (×8): qty 2
  Filled 2014-04-25: qty 24
  Filled 2014-04-25: qty 2
  Filled 2014-04-25: qty 24

## 2014-04-25 MED ORDER — CHLORDIAZEPOXIDE HCL 25 MG PO CAPS
25.0000 mg | ORAL_CAPSULE | Freq: Once | ORAL | Status: AC
  Administered 2014-04-26: 25 mg via ORAL

## 2014-04-25 MED ORDER — CHLORDIAZEPOXIDE HCL 25 MG PO CAPS
50.0000 mg | ORAL_CAPSULE | Freq: Four times a day (QID) | ORAL | Status: AC
  Administered 2014-04-25 – 2014-04-26 (×3): 50 mg via ORAL
  Filled 2014-04-25 (×3): qty 2

## 2014-04-25 MED ORDER — CHLORDIAZEPOXIDE HCL 25 MG PO CAPS
50.0000 mg | ORAL_CAPSULE | Freq: Once | ORAL | Status: AC
  Administered 2014-04-26: 50 mg via ORAL
  Filled 2014-04-25: qty 2

## 2014-04-25 MED ORDER — CHLORDIAZEPOXIDE HCL 25 MG PO CAPS
25.0000 mg | ORAL_CAPSULE | Freq: Four times a day (QID) | ORAL | Status: DC
  Administered 2014-04-27 (×2): 25 mg via ORAL
  Filled 2014-04-25 (×2): qty 1

## 2014-04-25 MED ORDER — CHLORDIAZEPOXIDE HCL 25 MG PO CAPS
25.0000 mg | ORAL_CAPSULE | Freq: Once | ORAL | Status: AC
  Filled 2014-04-25: qty 1

## 2014-04-25 NOTE — Progress Notes (Signed)
Pt attended NA group this evening.  

## 2014-04-25 NOTE — Progress Notes (Signed)
Pt has been up and active in the milieu today. He rated his depression 7 anxiety 8 and denied any hopeless feelings on his self-inventory.  He denies any S/H ideation or A/V/S. Pt is hoping to go to long-term treatment for detox.

## 2014-04-25 NOTE — Progress Notes (Signed)
D: Pt presents flat in affect and anxious in mood. Pt reports an overall improvement in the severity of his withdrawals. Pt endorses periods of sweats and chills (specifically during the night). Pt's tremors rated at 2 on the CIWA. Pt had a brief period of nausea that was relieved with Zofran.  Pt is currently negative for any SI/HI/AVH. Pt visible and active within the milieu. A: Writer administered scheduled and prn medications to pt. Continued support and availability as needed was extended to this pt. Staff continue to monitor pt with q56min checks.  R: No adverse drug reactions noted. Pt receptive to treatment. Pt remains safe at this time.

## 2014-04-25 NOTE — Plan of Care (Signed)
Problem: Alteration in mood & ability to function due to Goal: STG-Patient will report withdrawal symptoms Outcome: Progressing Pt still having tremors, sedation, cravings, irritability and cramping.  He does feel that the medication is helping his symptoms.

## 2014-04-25 NOTE — BHH Group Notes (Signed)
[facility] LCSW Aftercare Discharge Planning Group Note   04/25/2014 10:07 AM  Participation Quality:  Appropriate   Mood/Affect:  Depressed and Flat  Depression Rating:  7  Anxiety Rating:  9  Thoughts of Suicide:  No Will you contract for safety?   NA  Current AVH:  No  Plan for Discharge/Comments:  Pt reports that he lives alone currently (according to chart, pt is homeless) and plans to detox, discharge from [facility], and follow-up outpatient at [facility] for med management and therapy. Pt reports moderate withdrawals today and poor sleep. "Dr Sabra Heck put me on PTSD meds too." Pt plans to get back into AA at discharge and hopes to d/c soon--agreeable to Friday tentatively.   Transportation Means: bus pass   Supports: none identified by pt.   Smart, [employer]

## 2014-04-25 NOTE — Clinical Social Work Note (Signed)
Per pt request, [facility] referral made. Pt also given [employer], friends of bill info, and [facility] packet. He is agreeable to going to [facility] for outpatient mental health/substance abuse services. Pt also given AA/NA list.   Maxie Better, LCSWA 04/25/2014 4:00 PM

## 2014-04-25 NOTE — BHH Suicide Risk Assessment (Signed)
[place] INPATIENT:  Family/Significant Other Suicide Prevention Education  Suicide Prevention Education:  Patient Refusal for Family/Significant Other Suicide Prevention Education: The patient Chad Morgan has refused to provide written consent for family/significant other to be provided Family/Significant Other Suicide Prevention Education during admission and/or prior to discharge.  Physician notified.  SPE completed with pt, as he refused to consent to family contact. SPI pamphlet and Mobile Crisis info provided to pt and he was encouraged to share information with support network, ask questions, and talk about any concerns relating to SPE. Pt denies SI/HI/AVh at this time and denies access to guns or firearms.   Smart, Meleny Tregoning LCSWA  04/25/2014, 4:00 PM

## 2014-04-25 NOTE — Progress Notes (Signed)
D: Pt presents with sweats, tremors, diarrhea, nausea, and anxiety. CIWA rated at a 7. Pt is currently negative for any SI/HI/AVH. Pt did not attend group this evening. Pt presents anxious in affect and mood. Pt observed with minimal interaction with others.  A: Writer administered scheduled and prn medications to pt. Continued support and availability as needed was extended to this pt. Staff continue to monitor pt with q8min checks.  R: No adverse drug reactions noted. Pt receptive to treatment. Pt remains safe at this time.

## 2014-04-25 NOTE — BHH Group Notes (Signed)
[place] LCSW Group Therapy  04/25/2014 1:14 PM  Type of Therapy:  Group Therapy  Participation Level:  Active  Participation Quality:  Attentive  Affect:  Appropriate  Cognitive:  Alert and Oriented  Insight:  Engaged  Engagement in Therapy:  Engaged  Modes of Intervention:  Confrontation, Discussion, Education, Exploration, Problem-solving, Rapport Building, Socialization and Support  Summary of Progress/Problems: Today's Topic: Overcoming Obstacles. Pt identified obstacles faced currently and processed barriers involved in overcoming these obstacles. Pt identified steps necessary for overcoming these obstacles and explored motivation (internal and external) for facing these difficulties head on. Pt further identified one area of concern in their lives and chose a skill of focus pulled from their "toolbox." Chad Morgan was attentive and engaged during today's processing group. He shared that his biggest obstacles invovles "dealing with cravings and my PTSD." Pt reports that he attempts to numb the bad memories/flashbacks with alcohol due to his inability to cope with PTSD symptoms. "I have horrible sleep and I don't have anyone to talk to about it." Chad Morgan was open to suggestions for help with PTSD symptoms from other group members and CSW including "getting set up with the [facility], talking to someone involved with helping homeless veterans, etc." He reports that he and Dr. Sabra Heck are working on getting him on medication to help with symptoms as well.   Chad Morgan, Chad Morgan LCSWA  04/25/2014, 1:14 PM

## 2014-04-25 NOTE — Progress Notes (Signed)
[facility] MD Progress Note  04/25/2014 1:53 PM Chad Morgan  MRN:  [id number] Subjective:  Chad Morgan endorses that the Librium at 50 mg four times a day did help the withdrawal. He states that he still does not know what he is going to to. Would like to get out and secure a job, but he is also insightful  in the fact that even if he gets one right now if he goes back to drinking he will lose the job. He has no stable place to go and was thinking about urban ministry. He would consider an [employer]. He states he does not want to go into a residential treatment center. He states he has no one he can or would want to count on, states he is facing this alone. Will still not want to deal with the [place]. He does admit to cravings Principal Problem: Alcohol dependence with intoxication with complication Diagnosis:   Patient Active Problem List   Diagnosis Date Noted  . Severe major depression without psychotic features [F32.2] 04/24/2014    Priority: High  . Suicidal ideations [R45.851]     Priority: High  . Alcohol dependence with intoxication with complication [M22.633] 35/45/6256    Priority: High  . Social anxiety disorder [F40.10] 01/09/2014    Priority: High  . Panic attacks [F41.0] 01/09/2014    Priority: High  . PTSD (post-traumatic stress disorder) [F43.10] 04/25/2014  . Alcohol dependence with withdrawal, uncomplicated [L89.373] 42/87/6811  . Tobacco abuse [Z72.0] 04/16/2014  . Alcohol withdrawal [F10.239] 04/16/2014  . Alcohol dependence with uncomplicated withdrawal [X72.620]   . Major depressive disorder, recurrent episode, moderate [F33.1]   . GAD (generalized anxiety disorder) [F41.1] 01/09/2014  . Alcohol dependence [F10.20] 01/08/2014   Total Time spent with patient: 30 minutes   Past Medical History:  Past Medical History  Diagnosis Date  . Seizures   . Anxiety   . Depression   . Alcohol abuse     Past Surgical History  Procedure Laterality Date  . Wisdom tooth extraction      Family History:  Family History  Problem Relation Age of Onset  . Alcoholism Father    Social History:  History  Alcohol Use  . Yes    Comment: 1/5 to 1/2 gallon of liquor daily     History  Drug Use No    Comment: Patient denies     History   Social History  . Marital Status: Legally Separated    Spouse Name: N/A  . Number of Children: N/A  . Years of Education: N/A   Social History Main Topics  . Smoking status: Current Every Day Smoker -- 1.00 packs/day for 16 years    Types: Cigarettes  . Smokeless tobacco: Never Used  . Alcohol Use: Yes     Comment: 1/5 to 1/2 gallon of liquor daily  . Drug Use: No     Comment: Patient denies   . Sexual Activity: Yes   Other Topics Concern  . None   Social History Narrative   Additional History:    Sleep: Fair  Appetite:  Poor   Assessment:   Musculoskeletal: Strength & Muscle Tone: within normal limits Gait & Station: normal Patient leans: N/A   Psychiatric Specialty Exam: Physical Exam  Review of Systems  Constitutional: Positive for malaise/fatigue.  HENT: Negative.   Eyes: Negative.   Respiratory: Negative.   Cardiovascular: Negative.   Gastrointestinal: Positive for nausea and diarrhea.  Genitourinary: Negative.   Musculoskeletal: Negative.  Skin: Negative.   Neurological: Positive for weakness.  Endo/Heme/Allergies: Negative.   Psychiatric/Behavioral: Positive for depression and substance abuse. The patient is nervous/anxious.     Blood pressure 112/75, pulse 80, temperature 98.1 F (36.7 C), temperature source Oral, resp. rate 16, height 6\' 2"  (1.88 m), weight 108.863 kg (240 lb), SpO2 95 %.Body mass index is 30.8 kg/(m^2).  General Appearance: Fairly Groomed  Engineer, water::  Fair  Speech:  Clear and Coherent and not spontaneous  Volume:  Decreased  Mood:  Anxious, Depressed and worried  Affect:  Restricted  Thought Process:  Coherent and Goal Directed  Orientation:  Full (Time, Place, and  Person)  Thought Content:  symptoms events worries concerns  Suicidal Thoughts:  No  Homicidal Thoughts:  No  Memory:  Immediate;   Fair Recent;   Fair Remote;   Fair  Judgement:  Fair  Insight:  Present  Psychomotor Activity:  Restlessness  Concentration:  Fair  Recall:  [employer] of Knowledge:Fair  Language: Fair  Akathisia:  No  Handed:  Right  AIMS (if indicated):     Assets:  Desire for Improvement  ADL's:  Intact  Cognition: WNL  Sleep:  Number of Hours: 6.25     Current Medications: Current Facility-Administered Medications  Medication Dose Route Frequency Provider Last Rate Last Dose  . acamprosate (CAMPRAL) tablet 666 mg  666 mg Oral TID WC Nicholaus Bloom, MD   666 mg at 04/25/14 1201  . acetaminophen (TYLENOL) tablet 650 mg  650 mg Oral Q6H PRN [name], NP   650 mg at 04/25/14 0539  . alum & mag hydroxide-simeth (MAALOX/MYLANTA) 200-200-20 MG/5ML suspension 30 mL  30 mL Oral Q4H PRN [name], NP      . chlordiazePOXIDE (LIBRIUM) capsule 25 mg  25 mg Oral Q6H PRN [name], NP   25 mg at 04/25/14 4196  . chlordiazePOXIDE (LIBRIUM) capsule 50 mg  50 mg Oral QID Nicholaus Bloom, MD       Followed by  . [START ON 04/26/2014] chlordiazePOXIDE (LIBRIUM) capsule 25 mg  25 mg Oral Once Nicholaus Bloom, MD       Followed by  . [START ON 04/26/2014] chlordiazePOXIDE (LIBRIUM) capsule 50 mg  50 mg Oral Once Nicholaus Bloom, MD       Followed by  . [START ON 04/27/2014] chlordiazePOXIDE (LIBRIUM) capsule 25 mg  25 mg Oral QID Nicholaus Bloom, MD       Followed by  . [START ON 04/28/2014] chlordiazePOXIDE (LIBRIUM) capsule 25 mg  25 mg Oral TID Nicholaus Bloom, MD       Followed by  . [START ON 04/29/2014] chlordiazePOXIDE (LIBRIUM) capsule 25 mg  25 mg Oral BID Nicholaus Bloom, MD       Followed by  . [START ON 04/30/2014] chlordiazePOXIDE (LIBRIUM) capsule 25 mg  25 mg Oral Once Nicholaus Bloom, MD      . citalopram (CELEXA) tablet 20 mg  20 mg Oral Daily [name], NP    20 mg at 04/25/14 2229  . hydrOXYzine (ATARAX/VISTARIL) tablet 25 mg  25 mg Oral Q6H PRN [name], NP   25 mg at 04/25/14 0539  . loperamide (IMODIUM) capsule 2-4 mg  2-4 mg Oral PRN [name], NP   4 mg at 04/24/14 2035  . magnesium hydroxide (MILK OF MAGNESIA) suspension 30 mL  30 mL Oral Daily PRN [name], NP      .  multivitamin with minerals tablet 1 tablet  1 tablet Oral Daily [name], NP   1 tablet at 04/25/14 440 [id number]  . nicotine (NICODERM CQ - dosed in mg/24 hours) patch 21 mg  21 mg Transdermal Daily Nicholaus Bloom, MD   21 mg at 04/25/14 1443  . ondansetron (ZOFRAN-ODT) disintegrating tablet 4 mg  4 mg Oral Q6H PRN [name], NP   4 mg at 04/25/14 0539  . thiamine (B-1) injection 100 mg  100 mg Intramuscular Once Nicholaus Bloom, MD   100 mg at 04/23/14 1314  . thiamine (VITAMIN B-1) tablet 100 mg  100 mg Oral Daily [name], NP   100 mg at 04/25/14 0809  . traZODone (DESYREL) tablet 100 mg  100 mg Oral QHS [name], NP   100 mg at 04/24/14 2115    Lab Results: No results found for this or any previous visit (from the past 48 hour(s)).  Physical Findings: AIMS: Facial and Oral Movements Muscles of Facial Expression: None, normal Lips and Perioral Area: None, normal Jaw: None, normal Tongue: None, normal,Extremity Movements Upper (arms, wrists, hands, fingers): None, normal Lower (legs, knees, ankles, toes): None, normal, Trunk Movements Neck, shoulders, hips: None, normal, Overall Severity Severity of abnormal movements (highest score from questions above): None, normal Incapacitation due to abnormal movements: None, normal Patient's awareness of abnormal movements (rate only patient's report): No Awareness, Dental Status Current problems with teeth and/or dentures?: No Does patient usually wear dentures?: No  CIWA:  CIWA-Ar Total: 7 COWS:     Treatment Plan Summary: Daily contact with patient to assess and evaluate symptoms and progress in  treatment and Medication management Supportive approach/coping skills Alcohol Dependence: pursue the detox/work a relapse prevention plan Alcohol cravings: will start Campral 333 mg two TID Major depression; will continue to work with the Celexa and optimize dose/response Anxiety: continue to monitor and and address with CBT/mindfulness Placement; will make a list of [place] available to him PTSD; encourage to start sharing/addressing the traumatic events he experienced   Medical Decision Making:  Review of Psycho-Social Stressors (1), Review or order clinical lab tests (1), Review of Medication Regimen & Side Effects (2) and Review of New Medication or Change in Dosage (2)     Shakeia Krus A 04/25/2014, 1:53 PM

## 2014-04-26 MED ORDER — HYDROXYZINE HCL 25 MG PO TABS: 25.0000 mg | ORAL_TABLET | Freq: Four times a day (QID) | ORAL | Status: DC | PRN

## 2014-04-26 MED ORDER — HYDROXYZINE HCL 25 MG PO TABS
25.0000 mg | ORAL_TABLET | Freq: Four times a day (QID) | ORAL | Status: DC | PRN
  Administered 2014-04-26 – 2014-04-27 (×3): 25 mg via ORAL
  Filled 2014-04-26: qty 1
  Filled 2014-04-26: qty 10
  Filled 2014-04-26 (×3): qty 1

## 2014-04-26 NOTE — Tx Team (Signed)
Initial Interdisciplinary Treatment Plan   PATIENT STRESSORS: Substance abuse   PATIENT STRENGTHS: General fund of knowledge Motivation for treatment/growth   PROBLEM LIST: Problem List/Patient Goals Date to be addressed Date deferred Reason deferred Estimated date of resolution  Pt states that he "just wants help" he says that he is "drinking himself to death  (per assessment note) May 03, 2022     Etoh detox      SI      Depression                                     DISCHARGE CRITERIA:  Improved stabilization in mood, thinking, and/or behavior Medical problems require only outpatient monitoring Motivation to continue treatment in a less acute level of care Need for constant or close observation no longer present Reduction of life-threatening or endangering symptoms to within safe limits Withdrawal symptoms are absent or subacute and managed without 24-hour nursing intervention  PRELIMINARY DISCHARGE PLAN: Attend PHP/IOP Attend 12-step recovery group  PATIENT/FAMIILY INVOLVEMENT: This treatment plan has been presented to and reviewed with the patient, Chad Morgan.  The patient and family have been given the opportunity to ask questions and make suggestions.  Neala Miggins A 04/26/2014, 5:28 AM

## 2014-04-26 NOTE — [facility] Group Notes (Signed)
[place] LCSW Group Therapy  04/26/2014 1:02 PM  Type of Therapy:  Group Therapy  Participation Level:  Active  Participation Quality:  Attentive  Affect:  Appropriate  Cognitive:  Alert and Oriented  Insight:  Engaged  Engagement in Therapy:  Engaged  Modes of Intervention:  Confrontation, Discussion, Education, Exploration, Problem-solving, Rapport Building, Socialization and Support  Summary of Progress/Problems:  Finding Balance in Life. Today's group focused on defining balance in one's own words, identifying things that can knock one off balance, and exploring healthy ways to maintain balance in life. Group members were asked to provide an example of a time when they felt off balance, describe how they handled that situation,and process healthier ways to regain balance in the future. Group members were asked to share the most important tool for maintaining balance that they learned while at [facility] and how they plan to apply this method after discharge. Yomar was attentive and engaged during today's processing group. He shared that his idea of balance invovles "sobriety, having my PTSD managed, being able to work and have an income, and being able to have a relationship with my kids." Shrihaan talked about his latest relapse due to his desire to self medicate his PTSD symptoms that had become unmanageable. Jehan stated that he plans to make more time for himself by going fishing, playing basketball, and doing things that he enjoys after treatment. He continues to show progress in the group setting and improving insight.   Smart, Rhiannan Kievit LCSWA  04/26/2014, 1:02 PM

## 2014-04-26 NOTE — Progress Notes (Signed)
D.  Pt pleasant on approach, resting in bed.  Complaint of anxiety remains, no other complaints noted.  Pt did not feel well enough to attend evening karoake group.  Pt did wish to go to dayroom after group.  Interacting appropriately within milieu.  Denies SI/HI/hallucinations at this time.  Requested to take HS medications shortly after 9 PM.  A.  Support and encouragement offered, medications given as ordered.  R.  Pt remains safe on unit, will continue to monitor.

## 2014-04-26 NOTE — Progress Notes (Signed)
Pt alert and oriented. Pt attended morning group with nurses this morning. Pt participated and engaged in conversation. Pt is respectful of others and compliant with unit rules. Pt is also compliant with medications. Pt continues to request PRN anxiety medications and takes scheduled medications for withdrawal symptoms. Pt denies any SI/HI/AH/VH. Pt reports sleeping good over night. No current concerns. RN will continue to monitor.

## 2014-04-26 NOTE — Progress Notes (Signed)
[facility] MD Progress Note  04/26/2014 3:32 PM Chad Morgan  MRN:  [id number] Subjective:  [name] states he is slowly getting to feel better. He was able to sleep better last night and reports a decrease in the symptoms of withdrawal. He agrees that going to a shelter is not the best option for him. States he is willing to go to [facility] or if not able to do so into an [employer]. He states that last time he was here he was not completely into it but states during these last months he has seen how bad it can get and he does not want to continue doing the same thing over and over again. Every times it gets worst. Principal Problem: Alcohol dependence with intoxication with complication Diagnosis:   Patient Active Problem List   Diagnosis Date Noted  . Severe major depression without psychotic features [F32.2] 04/24/2014    Priority: High  . Suicidal ideations [R45.851]     Priority: High  . Alcohol dependence with intoxication with complication [O84.166] 07/05/1599    Priority: High  . Social anxiety disorder [F40.10] 01/09/2014    Priority: High  . Panic attacks [F41.0] 01/09/2014    Priority: High  . PTSD (post-traumatic stress disorder) [F43.10] 04/25/2014  . Alcohol dependence with withdrawal, uncomplicated [U93.235] 57/32/2025  . Tobacco abuse [Z72.0] 04/16/2014  . Alcohol withdrawal [F10.239] 04/16/2014  . Alcohol dependence with uncomplicated withdrawal [K27.062]   . Major depressive disorder, recurrent episode, moderate [F33.1]   . GAD (generalized anxiety disorder) [F41.1] 01/09/2014  . Alcohol dependence [F10.20] 01/08/2014   Total Time spent with patient: 30 minutes   Past Medical History:  Past Medical History  Diagnosis Date  . Seizures   . Anxiety   . Depression   . Alcohol abuse     Past Surgical History  Procedure Laterality Date  . Wisdom tooth extraction     Family History:  Family History  Problem Relation Age of Onset  . Alcoholism Father    Social History:   History  Alcohol Use  . Yes    Comment: 1/5 to 1/2 gallon of liquor daily     History  Drug Use No    Comment: Patient denies     History   Social History  . Marital Status: Legally Separated    Spouse Name: N/A  . Number of Children: N/A  . Years of Education: N/A   Social History Main Topics  . Smoking status: Current Every Day Smoker -- 1.00 packs/day for 16 years    Types: Cigarettes  . Smokeless tobacco: Never Used  . Alcohol Use: Yes     Comment: 1/5 to 1/2 gallon of liquor daily  . Drug Use: No     Comment: Patient denies   . Sexual Activity: Yes   Other Topics Concern  . None   Social History Narrative   Additional History:    Sleep: Fair  Appetite:  Fair   Assessment:   Musculoskeletal: Strength & Muscle Tone: within normal limits Gait & Station: normal Patient leans: N/A   Psychiatric Specialty Exam: Physical Exam  Review of Systems  Constitutional: Negative.   HENT: Negative.   Eyes: Negative.   Respiratory: Negative.   Cardiovascular: Negative.   Gastrointestinal: Negative.   Genitourinary: Negative.   Musculoskeletal: Negative.   Skin: Negative.   Neurological: Negative.   Endo/Heme/Allergies: Negative.   Psychiatric/Behavioral: Positive for depression and substance abuse. The patient is nervous/anxious.     Blood pressure  121/73, pulse 75, temperature 97.6 F (36.4 C), temperature source Oral, resp. rate 16, height 6\' 2"  (1.88 m), weight 108.863 kg (240 lb), SpO2 95 %.Body mass index is 30.8 kg/(m^2).  General Appearance: Fairly Groomed  Engineer, water::  Fair  Speech:  Clear and Coherent  Volume:  Decreased  Mood:  Anxious and Depressed  Affect:  Restricted  Thought Process:  Coherent and Goal Directed  Orientation:  Full (Time, Place, and Person)  Thought Content:  symptoms events worries concerns ( still somewhat guarded and hesitant to talk about some of his past experiences although he seems to be opening more in group )   Suicidal Thoughts:  No  Homicidal Thoughts:  No  Memory:  Immediate;   Fair Recent;   Fair Remote;   Fair  Judgement:  Fair  Insight:  Present and Shallow  Psychomotor Activity:  Restlessness  Concentration:  Fair  Recall:  [employer] of Knowledge:Fair  Language: Fair  Akathisia:  No  Handed:  Right  AIMS (if indicated):     Assets:  Desire for Improvement  ADL's:  Intact  Cognition: WNL  Sleep:  Number of Hours: 6.75     Current Medications: Current Facility-Administered Medications  Medication Dose Route Frequency Provider Last Rate Last Dose  . acamprosate (CAMPRAL) tablet 666 mg  666 mg Oral TID WC Nicholaus Bloom, MD   666 mg at 04/26/14 1202  . acetaminophen (TYLENOL) tablet 650 mg  650 mg Oral Q6H PRN [name], NP   650 mg at 04/25/14 0539  . alum & mag hydroxide-simeth (MAALOX/MYLANTA) 200-200-20 MG/5ML suspension 30 mL  30 mL Oral Q4H PRN [name], NP      . chlordiazePOXIDE (LIBRIUM) capsule 25 mg  25 mg Oral Q6H PRN [name], NP   25 mg at 04/26/14 1204  . chlordiazePOXIDE (LIBRIUM) capsule 25 mg  25 mg Oral Once Nicholaus Bloom, MD       Followed by  . chlordiazePOXIDE (LIBRIUM) capsule 50 mg  50 mg Oral Once Nicholaus Bloom, MD       Followed by  . [START ON 04/27/2014] chlordiazePOXIDE (LIBRIUM) capsule 25 mg  25 mg Oral QID Nicholaus Bloom, MD       Followed by  . [START ON 04/28/2014] chlordiazePOXIDE (LIBRIUM) capsule 25 mg  25 mg Oral TID Nicholaus Bloom, MD       Followed by  . [START ON 04/29/2014] chlordiazePOXIDE (LIBRIUM) capsule 25 mg  25 mg Oral BID Nicholaus Bloom, MD       Followed by  . [START ON 04/30/2014] chlordiazePOXIDE (LIBRIUM) capsule 25 mg  25 mg Oral Once Nicholaus Bloom, MD      . citalopram (CELEXA) tablet 20 mg  20 mg Oral Daily [name], NP   20 mg at 04/26/14 0900  . hydrOXYzine (ATARAX/VISTARIL) tablet 25 mg  25 mg Oral Q6H PRN Nicholaus Bloom, MD   25 mg at 04/26/14 1501  . magnesium hydroxide (MILK OF MAGNESIA) suspension  30 mL  30 mL Oral Daily PRN [name], NP      . multivitamin with minerals tablet 1 tablet  1 tablet Oral Daily [name], NP   1 tablet at 04/26/14 0900  . nicotine (NICODERM CQ - dosed in mg/24 hours) patch 21 mg  21 mg Transdermal Daily Nicholaus Bloom, MD   21 mg at 04/26/14 0900  . thiamine (B-1) injection  100 mg  100 mg Intramuscular Once Nicholaus Bloom, MD   100 mg at 04/23/14 1314  . thiamine (VITAMIN B-1) tablet 100 mg  100 mg Oral Daily [name], NP   100 mg at 04/26/14 0900  . traZODone (DESYREL) tablet 100 mg  100 mg Oral QHS [name], NP   100 mg at 04/25/14 2124    Lab Results: No results found for this or any previous visit (from the past 33 hour(s)).  Physical Findings: AIMS: Facial and Oral Movements Muscles of Facial Expression: None, normal Lips and Perioral Area: None, normal Jaw: None, normal Tongue: None, normal,Extremity Movements Upper (arms, wrists, hands, fingers): None, normal Lower (legs, knees, ankles, toes): None, normal, Trunk Movements Neck, shoulders, hips: None, normal, Overall Severity Severity of abnormal movements (highest score from questions above): None, normal Incapacitation due to abnormal movements: None, normal Patient's awareness of abnormal movements (rate only patient's report): No Awareness, Dental Status Current problems with teeth and/or dentures?: No Does patient usually wear dentures?: No  CIWA:  CIWA-Ar Total: 5 COWS:     Treatment Plan Summary: Daily contact with patient to assess and evaluate symptoms and progress in treatment and Medication management Supportive approach/coping skills Alcohol dependence; continue the detox/work a relapse prevention plan Alcohol cravings: continue the Campral Depression; continue to work with the Celexa and consider augmentation  PTSD; continue to facilitate talking about his traumatic experiences CBT/mindfulness  Medical Decision Making:  Review of Psycho-Social Stressors  (1), Review of Medication Regimen & Side Effects (2) and Review of New Medication or Change in Dosage (2)     Nyesha Cliff A 04/26/2014, 3:32 PM

## 2014-04-26 NOTE — Progress Notes (Addendum)
Patient did not attend the evening karaoke group. Pt was notified that group was beginning but remained in bed. Pt was notified that the dayroom had reopened after group and snacks were available to him but pt remained in bed.

## 2014-04-27 DIAGNOSIS — F332 Major depressive disorder, recurrent severe without psychotic features: Secondary | ICD-10-CM | POA: Insufficient documentation

## 2014-04-27 MED ORDER — TRAZODONE HCL 100 MG PO TABS: 100.0000 mg | ORAL_TABLET | Freq: Every day | ORAL | Status: DC

## 2014-04-27 MED ORDER — HYDROXYZINE HCL 25 MG PO TABS: 25.0000 mg | ORAL_TABLET | Freq: Four times a day (QID) | ORAL | Status: DC | PRN

## 2014-04-27 MED ORDER — ACAMPROSATE CALCIUM 333 MG PO TBEC: 666.0000 mg | DELAYED_RELEASE_TABLET | Freq: Three times a day (TID) | ORAL | Status: DC

## 2014-04-27 MED ORDER — CITALOPRAM HYDROBROMIDE 20 MG PO TABS: 20.0000 mg | ORAL_TABLET | Freq: Every day | ORAL | Status: DC

## 2014-04-27 MED ORDER — CHLORDIAZEPOXIDE HCL 25 MG PO CAPS: 25.0000 mg | ORAL_CAPSULE | Freq: Four times a day (QID) | ORAL | Status: DC

## 2014-04-27 NOTE — Discharge Summary (Signed)
Physician Discharge Summary Note  Patient:  Chad Morgan is an 34 y.o., male MRN:  [id number] DOB:  [birth year] Patient phone:  [phone] (home)  Patient address:   [place] [address],  Total Time spent with patient: Greater than 30 minutes  Date of Admission:  04/23/2014  Date of Discharge: 04/27/14  Reason for Admission:  Alcohol intoxication  Principal Problem: Alcohol dependence with intoxication with complication Discharge Diagnoses: Patient Active Problem List   Diagnosis Date Noted  . PTSD (post-traumatic stress disorder) [F43.10] 04/25/2014  . Severe major depression without psychotic features [F32.2] 04/24/2014  . Suicidal ideations [R45.851]   . Alcohol dependence with withdrawal, uncomplicated [I95.188] 41/66/0630  . Alcohol dependence with intoxication with complication [Z60.109] 32/35/5732  . Tobacco abuse [Z72.0] 04/16/2014  . Alcohol withdrawal [F10.239] 04/16/2014  . Alcohol dependence with uncomplicated withdrawal [K02.542]   . Major depressive disorder, recurrent episode, moderate [F33.1]   . Social anxiety disorder [F40.10] 01/09/2014  . Panic attacks [F41.0] 01/09/2014  . GAD (generalized anxiety disorder) [F41.1] 01/09/2014  . Alcohol dependence [F10.20] 01/08/2014   Musculoskeletal: Strength & Muscle Tone: within normal limits Gait & Station: normal Patient leans: N/A  Psychiatric Specialty Exam: Physical Exam  Psychiatric: His speech is normal. Judgment and thought content normal. His mood appears not anxious. His affect is not angry, not blunt, not labile and not inappropriate. Cognition and memory are normal. He does not exhibit a depressed mood.    Review of Systems  Constitutional: Negative.   HENT: Negative.   Eyes: Negative.   Respiratory: Negative.   Cardiovascular: Negative.   Gastrointestinal: Negative.   Genitourinary: Negative.   Musculoskeletal: Negative.   Skin: Negative.   Neurological: Negative.    Endo/Heme/Allergies: Negative.   Psychiatric/Behavioral: Positive for depression (Stable). Negative for suicidal ideas, hallucinations and memory loss. The patient has insomnia (Stable). The patient is not nervous/anxious.     Blood pressure 125/73, pulse 86, temperature 97.7 F (36.5 C), temperature source Oral, resp. rate 18, height 6\' 2"  (1.88 m), weight 108.863 kg (240 lb), SpO2 95 %.Body mass index is 30.8 kg/(m^2).  See Md's suicide risks    Past Medical History:  Past Medical History  Diagnosis Date  . Seizures   . Anxiety   . Depression   . Alcohol abuse     Past Surgical History  Procedure Laterality Date  . Wisdom tooth extraction     Family History:  Family History  Problem Relation Age of Onset  . Alcoholism Father    Social History:  History  Alcohol Use  . Yes    Comment: 1/5 to 1/2 gallon of liquor daily     History  Drug Use No    Comment: Patient denies     History   Social History  . Marital Status: Legally Separated    Spouse Name: N/A  . Number of Children: N/A  . Years of Education: N/A   Social History Main Topics  . Smoking status: Current Every Day Smoker -- 1.00 packs/day for 16 years    Types: Cigarettes  . Smokeless tobacco: Never Used  . Alcohol Use: Yes     Comment: 1/5 to 1/2 gallon of liquor daily  . Drug Use: No     Comment: Patient denies   . Sexual Activity: Yes   Other Topics Concern  . None   Social History Narrative   Risk to Self: Is patient at risk for suicide?: Yes  Risk to Others: No  Prior Inpatient Therapy: No  Prior Outpatient Therapy: No  Level of Care:  OP  Hospital Course:  34 Y/o male who states he is drinking half a gallon a day, every day since pretty much two weeks after he left here ( Jan 10, 2014). States he left the place he was staying at and since then he is homeless. States that he has continued to feel depressed, anxious has not been able to get a job he is feeling hopeless helpless  worthless with suicidal ideas with no active plan as this time.  Gloyd was admitted to the hospital with his blood alcohol level at 204 per toxicology tests results & UDS positive for Benzodiazepine. He was intoxicated & presenting with depressed mood/suicidal ideations. He was in need of alcohol detox as well mood stabilization treatment. His detoxification treatment was achieved using Librium detox protocols. And for mood stabilization treatment, Arlene was medicated & discharged on Citalopram 20 mg for depression, Acamprosate 666 mg for alcoholism, Hydroxyzine 25 mg prn for anxiety & Trazodone 100 mg for insomnia. Kaston presented no other pre-existing medical issues that required treatment. He tolerated his treatment regimen without any significant adverse effects reported.  Jarid's depressive mood & withdrawal symptoms responded well to his treatment regimen. He is currently being discharged to continue substance abuse treatment as referred below. He is provided with all the necessary information needed to contact these centers & the make these appointments without problems. He was provided with the remaining doses of his Librium detox protocols to complete at home, a total of 10 capsules on tapering dose format.   Upon discharge, he adamantly denies any SIHI, AVH, delusional thoughts, paranoia & or substance withdrawal syndrome. [place] was provided with a 14 days worth, supply samples of his [facility] discharge medications. He left [facility] with all personal belongings in no apparent distress. Transportation per city bus. Bus pass provided by [facility].  Consults:  psychiatry  Significant Diagnostic Studies:  labs: CBC with diff, CMP, UDS, toxicology tests, U/A, results reviewed, stable  Discharge Vitals:   Blood pressure 125/73, pulse 86, temperature 97.7 F (36.5 C), temperature source Oral, resp. rate 18, height 6\' 2"  (1.88 m), weight 108.863 kg (240 lb), SpO2 95 %. Body mass index is 30.8 kg/(m^2). Lab Results:    No results found for this or any previous visit (from the past 72 hour(s)).  Physical Findings: AIMS: Facial and Oral Movements Muscles of Facial Expression: None, normal Lips and Perioral Area: None, normal Jaw: None, normal Tongue: None, normal,Extremity Movements Upper (arms, wrists, hands, fingers): None, normal Lower (legs, knees, ankles, toes): None, normal, Trunk Movements Neck, shoulders, hips: None, normal, Overall Severity Severity of abnormal movements (highest score from questions above): None, normal Incapacitation due to abnormal movements: None, normal Patient's awareness of abnormal movements (rate only patient's report): No Awareness, Dental Status Current problems with teeth and/or dentures?: No Does patient usually wear dentures?: No  CIWA:  CIWA-Ar Total: 1 COWS:  COWS Total Score: 4   See Psychiatric Specialty Exam and Suicide Risk Assessment completed by Attending Physician prior to discharge.  Discharge destination:  Home  Is patient on multiple antipsychotic therapies at discharge:  No   Has Patient had three or more failed trials of antipsychotic monotherapy by history:  No  Recommended Plan for Multiple Antipsychotic Therapies: NA    Medication List    TAKE these medications      Indication   acamprosate 333 MG tablet  Commonly known as:  CAMPRAL  Take 2 tablets (666 mg total) by mouth 3 (three) times daily with meals. For alcohol addiction   Indication:  Excessive Use of Alcohol     chlordiazePOXIDE 25 MG capsule  Commonly known as:  LIBRIUM  - Take 1 capsule (25 mg total) by mouth 4 (four) times daily. X 1 day: #4 capsules  - Take 1 capsule (25 mg) three times daily x 1 day: #3 capsules  - Take 1 capsule (25 mg) two times daily x 1 days: #2 capsules  - Take 1 capsul  (25 mg) one time daily x 1 day: 1 capsule   Indication:  Acute Alcohol Withdrawal Syndrome     citalopram 20 MG tablet  Commonly known as:  CELEXA  Take 1 tablet (20 mg  total) by mouth daily. For depression   Indication:  Depression     hydrOXYzine 25 MG tablet  Commonly known as:  ATARAX/VISTARIL  Take 1 tablet (25 mg total) by mouth every 6 (six) hours as needed for anxiety.   Indication:  Anxiety     traZODone 100 MG tablet  Commonly known as:  DESYREL  Take 1 tablet (100 mg total) by mouth at bedtime. For sleep   Indication:  Trouble Sleeping       Follow-up Information    [facility]  On 05/01/2014.   Why:  Walk in between 9AM-12PM on this date for assessment (medication management/individual therapy/SAIOP). Thank you.    Contact information:   [address] [place], [place] [address] Phone: [phone] Fax: [phone]      Follow up with [facility].   Why:  referral sent: 04/25/14 at 4:00PM. You are on waitlist currently. Please call Melissa at [phone] for update after discharge.    Contact information:   [address] [place], [place] [address] Phone: [phone] Fax: [phone]      Follow up with [facility] On 05/25/2014.   Why:  Appt on this date at 10:15AM to get set up for orange card. Please walk in anytime to pick up application and complete prior to appt. The office can refer and schedule you for primary care at this time.    Contact information:   [address] [place], [place] [address] Phone: [phone] Fax: [phone]     Follow-up recommendations: Activity:  As tolerated Diet: As recommended by your primary care doctor. Keep all scheduled follow-up appointments as recommended.    Comments: Take all your medications as prescribed by your mental healthcare provider. Report any adverse effects and or reactions from your medicines to your outpatient provider promptly. Patient is instructed and cautioned to not engage in alcohol and or illegal drug use while on prescription medicines. In the event of worsening symptoms, patient is instructed  to call the crisis hotline, 911 and or go to the nearest ED for appropriate evaluation and treatment of symptoms. Follow-up with your primary care provider for your other medical issues, concerns and or health care needs.   Total Discharge Time:Greater than 30 minutes  Signed: Encarnacion Slates, PMHNP, FNP-BC 04/27/2014, 1:25 PM  I agree with assessment and plan Geralyn Flash A. Sabra Heck, M.D.

## 2014-04-27 NOTE — Plan of Care (Signed)
Problem: Diagnosis: Increased Risk For Suicide Attempt Goal: LTG-Patient Will Report Improved Mood and Deny Suicidal LTG (by discharge) Patient will report improved mood and deny suicidal ideation.  Outcome: Progressing Pt denies suicidal ideation, pleasant though anxious mood

## 2014-04-27 NOTE — Progress Notes (Signed)
Discharge Note:  Patient discharged with 2 bus tickets.  Denied SI and HI.  Denied A/V hallucinations.  Denied pain.  Suicide prevention information given and discussed with patient who stated he understood and had no questions.  Patient stated he received all his belongings, clothing, toiletries, medications, prescriptions, etc.  Patient stated he appreciated all assistance received from [facility] staff.

## 2014-04-27 NOTE — BHH Group Notes (Signed)
[facility] LCSW Aftercare Discharge Planning Group Note   04/27/2014 12:11 PM  Participation Quality:  Appropriate   Mood/Affect:  Appropriate  Depression Rating:  0  Anxiety Rating:  3-4  Thoughts of Suicide:  No Will you contract for safety?   NA  Current AVH:  No  Plan for Discharge/Comments:  Pt reports that he is ready to d/c today and has [facility] interview at [facility]. Pt plans to follow-up outpatient at [facility] and CSW made appt for orange card application with [facility]. Pt also given Melissa's info at [facility]-he is still on waitlist.   Transportation Means: bus pass (2) and letter in chart per pt request.   Supports: some friends/limited family supports   Smart, Research officer, trade union

## 2014-04-27 NOTE — Progress Notes (Signed)
  [facility] Adult Case Management Discharge Plan :  Will you be returning to the same living situation after discharge:  No.Pt has interview and possible admission to [facility] at [facility] today.  At discharge, do you have transportation home?: Yes,  2 bus passes in chart Do you have the ability to pay for your medications: Yes,  mental health  Release of information consent forms completed and submitted to Medical Records by CSW.  Patient to Follow up at: Follow-up Information    [facility]  On 05/01/2014.   Why:  Walk in between 9AM-12PM on this date for assessment (medication management/individual therapy/SAIOP). Thank you.    Contact information:   [address] [place], [place] [address] Phone: [phone] ext [phone] Fax: [phone]      Follow up with [facility].   Why:  referral sent: 04/25/14 at 4:00PM. You are on waitlist currently. Please call Melissa at [phone] for update after discharge.    Contact information:   [address] [place], [place] [address] Phone: [phone] Fax: [phone]      Follow up with [facility] On 05/25/2014.   Why:  Appt on this date at 10:15AM to get set up for orange card. Please walk in anytime to pick up application and complete prior to appt. The office can refer and schedule you for primary care at this time.    Contact information:   [address]. [place],  [address] Phone: [phone] Fax: [phone]      Patient denies SI/HI: Yes,  during group/self report.     Safety Planning and Suicide Prevention discussed: Yes,  SPE completed with pt, as pt refused to consent to family contact. SPI pamphlet provided to pt and he was encouraged to share information with support network, ask questions, and talk about any concerns relating to SPE. Mobile Crisis info also provided to pt.   Have you used any form of tobacco in the last 30 days? (Cigarettes, Smokeless Tobacco, Cigars,  and/or Pipes): No ---(pt reports some tobacco use but declined quitline at this time) [place], LCSWA  Has patient been referred to the Quitline?: Patient refused referral  Smart, Alicia Amel 04/27/2014, 12:17 PM

## 2014-04-27 NOTE — BHH Suicide Risk Assessment (Signed)
[facility] Discharge Suicide Risk Assessment   Demographic Factors:  Male and Caucasian  Total Time spent with patient: 30 minutes  Musculoskeletal: Strength & Muscle Tone: within normal limits Gait & Station: normal Patient leans: N/A  Psychiatric Specialty Exam: Physical Exam  Review of Systems  Constitutional: Negative.   HENT: Negative.   Eyes: Negative.   Respiratory: Negative.   Cardiovascular: Negative.   Gastrointestinal: Negative.   Genitourinary: Negative.   Musculoskeletal: Negative.   Skin: Negative.   Neurological: Negative.   Endo/Heme/Allergies: Negative.   Psychiatric/Behavioral: Positive for depression and substance abuse. The patient is nervous/anxious.     Blood pressure 125/73, pulse 86, temperature 97.7 F (36.5 C), temperature source Oral, resp. rate 18, height 6\' 2"  (1.88 m), weight 108.863 kg (240 lb), SpO2 95 %.Body mass index is 30.8 kg/(m^2).  General Appearance: Fairly Groomed  Engineer, water::  Fair  Speech:  Clear and [id number]  Volume:  Normal  Mood:  Anxious  Affect:  Appropriate  Thought Process:  Coherent and Goal Directed  Orientation:  Full (Time, Place, and Person)  Thought Content:  plans as he moves on, relapse prevention plan  Suicidal Thoughts:  No  Homicidal Thoughts:  No  Memory:  Immediate;   Fair Recent;   Fair Remote;   Fair  Judgement:  Fair  Insight:  Present  Psychomotor Activity:  Normal  Concentration:  Fair  Recall:  [employer] of Knowledge:Fair  Language: Fair  Akathisia:  No  Handed:  Right  AIMS (if indicated):     Assets:  Desire for Improvement Vocational/Educational  Sleep:  Number of Hours: 5  Cognition: WNL  ADL's:  Intact   Have you used any form of tobacco in the last 30 days? (Cigarettes, Smokeless Tobacco, Cigars, and/or Pipes): No  Has this patient used any form of tobacco in the last 30 days? (Cigarettes, Smokeless Tobacco, Cigars, and/or Pipes) No  Mental Status Per Nursing Assessment::   On  Admission:  Suicidal ideation indicated by patient  Current Mental Status by Physician: In full contact with reality. There are no active S/S of withdrawal. There are no active SI plans or intent. He states he is committed to abstinence. Admits that last time he was here he left and went to stay with a male who drank. He relapsed shortly afterwards. 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 States that when he got out in 2010 the friends who went to the [place] did not have a good experience for what he did not pursue help there and has tried to do it on his own. Now he is willing to give the [place] a try. 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  Patients presenting with no risk factors but with morbid ruminations; may be classified as minimal risk based on the  severity of the depressive symptoms  Principal Problem: Alcohol dependence with intoxication with complication Discharge Diagnoses:  Patient Active Problem List   Diagnosis Date Noted  . Severe major depression without psychotic features [F32.2] 04/24/2014    Priority: High  . Suicidal ideations [R45.851]     Priority: High  . Alcohol dependence with intoxication with complication [Y81.448] 18/56/3149    Priority: High  . Social anxiety  disorder [F40.10] 01/09/2014    Priority: High  . Panic attacks [F41.0] 01/09/2014    Priority: High  . PTSD (post-traumatic stress disorder) [F43.10] 04/25/2014  . Alcohol dependence with withdrawal, uncomplicated [F02.637] 85/88/5027  . Tobacco abuse [Z72.0] 04/16/2014  . Alcohol withdrawal [F10.239] 04/16/2014  . Alcohol dependence with uncomplicated withdrawal [X41.287]   . Major depressive disorder, recurrent episode, moderate [F33.1]   . GAD (generalized anxiety disorder) [F41.1] 01/09/2014  . Alcohol dependence [F10.20] 01/08/2014    Follow-up Information    [facility]  On 05/01/2014.   Why:  Walk in between 9AM-12PM on this date for assessment (medication management/individual therapy/SAIOP). Thank you.    Contact information:   [address] [place], [place] [address] Phone: [phone] ext [phone] Fax: [phone]      Follow up with [facility].   Why:  referral sent: 04/25/14 at 4:00PM. You are on waitlist currently. Please call Melissa at [phone] for update after discharge.    Contact information:   [address] [place], [place] [address] Phone: [phone] Fax: [phone]      Follow up with [facility] On 05/25/2014.   Why:  Appt on this date at 10:15AM to get set up for orange card. Please walk in anytime to pick up application and complete prior to appt. The office can refer and schedule you for primary care at this time.    Contact information:   [address] [place], [place] [address] Phone: [phone] Fax: [phone]      Plan Of Care/Follow-up recommendations:  Activity:  as tolerated Diet:  heart healthy Finish the detox at home Follow up [facility] Continue to go to AA Is patient on multiple antipsychotic therapies at discharge:  No   Has Patient had three or more failed trials of antipsychotic monotherapy by history:  No  Recommended Plan for Multiple Antipsychotic  Therapies: NA    Iysis Germain A 04/27/2014, 1:46 PM

## 2014-04-27 NOTE — Progress Notes (Signed)
D:  Patient denied SI and HI, contracts for safety.  Denied A/V hallucinations.  Denied pain.  Stated he is looking forward to discharge today. A:  Medications administered per MD orders.  Emotional support and encouragement given patient. R:  Safety maintained with 15 minute checks.

## 2014-05-01 NOTE — Progress Notes (Signed)
Patient Discharge Instructions:  After Visit Summary (AVS):   Faxed to:  05/01/14 Discharge Summary Note:   Faxed to:  05/01/14 Psychiatric Admission Assessment Note:   Faxed to:  05/01/14 Suicide Risk Assessment - Discharge Assessment:   Faxed to:  05/01/14 Faxed/Sent to the Next Level Care provider:  05/01/14 Faxed to [facility] @ [place], 05/01/2014, 3:52 PM

## 2014-05-03 ENCOUNTER — Emergency Department (HOSPITAL_COMMUNITY)
Admission: EM | Admit: 2014-05-03 | Discharge: 2014-05-04 | Disposition: A | Discharge status: Home / Self Care | Payer: Self-pay | Attending: Emergency Medicine | Admitting: Emergency Medicine

## 2014-05-03 ENCOUNTER — Encounter (HOSPITAL_COMMUNITY): Payer: Self-pay

## 2014-05-03 DIAGNOSIS — R7401 Elevation of levels of liver transaminase levels: Secondary | ICD-10-CM

## 2014-05-03 DIAGNOSIS — F1310 Sedative, hypnotic or anxiolytic abuse, uncomplicated: Secondary | ICD-10-CM | POA: Insufficient documentation

## 2014-05-03 DIAGNOSIS — R000 Tachycardia, unspecified: Secondary | ICD-10-CM | POA: Insufficient documentation

## 2014-05-03 DIAGNOSIS — R740 Nonspecific elevation of levels of transaminase and lactic acid dehydrogenase [LDH]: Secondary | ICD-10-CM | POA: Insufficient documentation

## 2014-05-03 DIAGNOSIS — Z720 Tobacco use: Secondary | ICD-10-CM | POA: Insufficient documentation

## 2014-05-03 DIAGNOSIS — R45851 Suicidal ideations: Secondary | ICD-10-CM

## 2014-05-03 DIAGNOSIS — Z79899 Other long term (current) drug therapy: Secondary | ICD-10-CM | POA: Insufficient documentation

## 2014-05-03 DIAGNOSIS — F329 Major depressive disorder, single episode, unspecified: Secondary | ICD-10-CM | POA: Insufficient documentation

## 2014-05-03 DIAGNOSIS — F1210 Cannabis abuse, uncomplicated: Secondary | ICD-10-CM | POA: Insufficient documentation

## 2014-05-03 DIAGNOSIS — F419 Anxiety disorder, unspecified: Secondary | ICD-10-CM | POA: Insufficient documentation

## 2014-05-03 DIAGNOSIS — F10239 Alcohol dependence with withdrawal, unspecified: Secondary | ICD-10-CM | POA: Insufficient documentation

## 2014-05-03 HISTORY — DX: Post-traumatic stress disorder, unspecified: F43.10

## 2014-05-03 LAB — RAPID URINE DRUG SCREEN, HOSP PERFORMED
Amphetamines: NOT DETECTED
Barbiturates: NOT DETECTED
Benzodiazepines: POSITIVE — AB
COCAINE: NOT DETECTED
Opiates: NOT DETECTED
Tetrahydrocannabinol: POSITIVE — AB

## 2014-05-03 LAB — CBC
HCT: 45.4 % (ref 39.0–52.0)
HEMOGLOBIN: 16 g/dL (ref 13.0–17.0)
MCH: 33.3 pg (ref 26.0–34.0)
MCHC: 35.2 g/dL (ref 30.0–36.0)
MCV: 94.6 fL (ref 78.0–100.0)
Platelets: 283 10*3/uL (ref 150–400)
RBC: 4.8 MIL/uL (ref 4.22–5.81)
RDW: 13 % (ref 11.5–15.5)
WBC: 9.2 10*3/uL (ref 4.0–10.5)

## 2014-05-03 LAB — COMPREHENSIVE METABOLIC PANEL
ALBUMIN: 4.4 g/dL (ref 3.5–5.2)
ALK PHOS: 46 U/L (ref 39–117)
ALT: 445 U/L — ABNORMAL HIGH (ref 0–53)
ANION GAP: 10 (ref 5–15)
AST: 503 U/L — ABNORMAL HIGH (ref 0–37)
BILIRUBIN TOTAL: 1.2 mg/dL (ref 0.3–1.2)
BUN: 17 mg/dL (ref 6–23)
CHLORIDE: 105 mmol/L (ref 96–112)
CO2: 24 mmol/L (ref 19–32)
Calcium: 8.2 mg/dL — ABNORMAL LOW (ref 8.4–10.5)
Creatinine, Ser: 0.77 mg/dL (ref 0.50–1.35)
GFR calc non Af Amer: >90 mL/min (ref >90)
Glucose, Bld: 141 mg/dL — ABNORMAL HIGH (ref 70–99)
POTASSIUM: 3.7 mmol/L (ref 3.5–5.1)
Sodium: 139 mmol/L (ref 135–145)
TOTAL PROTEIN: 7.2 g/dL (ref 6.0–8.3)

## 2014-05-03 LAB — ACETAMINOPHEN LEVEL

## 2014-05-03 LAB — ETHANOL: Alcohol, Ethyl (B): 314 mg/dL — ABNORMAL HIGH (ref 0–9)

## 2014-05-03 LAB — SALICYLATE LEVEL

## 2014-05-03 MED ORDER — ONDANSETRON HCL 4 MG PO TABS
4.0000 mg | ORAL_TABLET | Freq: Three times a day (TID) | ORAL | Status: DC | PRN
  Administered 2014-05-03: 4 mg via ORAL
  Filled 2014-05-03: qty 1

## 2014-05-03 MED ORDER — LORAZEPAM 2 MG/ML IJ SOLN
1.0000 mg | Freq: Once | INTRAMUSCULAR | Status: AC
  Administered 2014-05-03: 1 mg via INTRAMUSCULAR
  Filled 2014-05-03: qty 1

## 2014-05-03 MED ORDER — LORAZEPAM 1 MG PO TABS: 0.0000 mg | ORAL_TABLET | Freq: Two times a day (BID) | ORAL | Status: DC

## 2014-05-03 MED ORDER — TRAZODONE HCL 100 MG PO TABS
100.0000 mg | ORAL_TABLET | Freq: Every day | ORAL | Status: DC
  Administered 2014-05-03: 100 mg via ORAL
  Filled 2014-05-03: qty 1

## 2014-05-03 MED ORDER — LORAZEPAM 2 MG/ML IJ SOLN: 0.5000 mg | Freq: Once | INTRAMUSCULAR | Status: DC

## 2014-05-03 MED ORDER — LORAZEPAM 1 MG PO TABS
0.0000 mg | ORAL_TABLET | Freq: Four times a day (QID) | ORAL | Status: DC
  Administered 2014-05-03: 1 mg via ORAL
  Filled 2014-05-03: qty 1

## 2014-05-03 MED ORDER — VITAMIN B-1 100 MG PO TABS
100.0000 mg | ORAL_TABLET | Freq: Every day | ORAL | Status: DC
  Administered 2014-05-04: 100 mg via ORAL
  Filled 2014-05-03: qty 1

## 2014-05-03 MED ORDER — THIAMINE HCL 100 MG/ML IJ SOLN
100.0000 mg | Freq: Every day | INTRAMUSCULAR | Status: DC
  Administered 2014-05-03: 100 mg via INTRAVENOUS
  Filled 2014-05-03: qty 2

## 2014-05-03 MED ORDER — CITALOPRAM HYDROBROMIDE 20 MG PO TABS
20.0000 mg | ORAL_TABLET | Freq: Every day | ORAL | Status: DC
  Administered 2014-05-03 – 2014-05-04 (×2): 20 mg via ORAL
  Filled 2014-05-03 (×2): qty 1

## 2014-05-03 MED ORDER — IBUPROFEN 200 MG PO TABS
600.0000 mg | ORAL_TABLET | Freq: Three times a day (TID) | ORAL | Status: DC | PRN
  Administered 2014-05-03: 600 mg via ORAL
  Filled 2014-05-03: qty 3

## 2014-05-03 MED ORDER — SODIUM CHLORIDE 0.9 % IV BOLUS (SEPSIS): 1000.0000 mL | Freq: Once | INTRAVENOUS | Status: DC
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
## 2014-05-03 NOTE — ED Provider Notes (Signed)
CSN: [id number]     Arrival date & time 05/03/14  1652 History   First MD Initiated Contact with Patient 05/03/14 1805     Chief Complaint  Patient presents with  . Depression  . PTSD   . Suicidal  . alcohol detox      (Consider location/radiation/quality/duration/timing/severity/associated sxs/prior Treatment) HPI   PCP: No PCP Per Patient Blood pressure 122/79, pulse 127, temperature 99 F (37.2 C), temperature source Oral, resp. rate 20, SpO2 97 %.  Chad Morgan is a 34 y.o.male with a significant PMH of seizures, anxiety, depression, PTSD, and alcohol abuse presents to the ER with complaints of suicidal ideation- will not tell me his plan but states " I am a [employer]". He also reports having severe depression and alcohol dependency since 2010. He says that it has been worse than normal lately. He feels as though he is starting to go into withdrawals. He reports one beer last night and then one throughout the day today.  He denies doing anything to harm himself today or yesterday. Denies ingestion of any substances. He is tachycardic in triage and dry heaving. He denies any hallucinations.  Negative Review of Symptoms: diarrhea, CP, SOB, confusion, weakness, lower extremity swelling, rash, IV drug use.  Past Medical History  Diagnosis Date  . Seizures   . Anxiety   . Depression   . Alcohol abuse   . PTSD (post-traumatic stress disorder)    Past Surgical History  Procedure Laterality Date  . Wisdom tooth extraction     Family History  Problem Relation Age of Onset  . Alcoholism Father    History  Substance Use Topics  . Smoking status: Current Every Day Smoker -- 1.00 packs/day for 16 years    Types: Cigarettes  . Smokeless tobacco: Never Used  . Alcohol Use: Yes     Comment:  1/2 gallon of liquor daily    Review of Systems  10 Systems reviewed and are negative for acute change except as noted in the HPI.    Allergies  Review of patient's allergies indicates  no known allergies.  Home Medications   Prior to Admission medications   Medication Sig Start Date End Date Taking? Authorizing Provider  acamprosate (CAMPRAL) 333 MG tablet Take 2 tablets (666 mg total) by mouth 3 (three) times daily with meals. For alcohol addiction 04/27/14  Yes Encarnacion Slates, NP  citalopram (CELEXA) 20 MG tablet Take 1 tablet (20 mg total) by mouth daily. For depression 04/27/14  Yes Encarnacion Slates, NP  hydrOXYzine (ATARAX/VISTARIL) 25 MG tablet Take 1 tablet (25 mg total) by mouth every 6 (six) hours as needed for anxiety. 04/27/14  Yes Encarnacion Slates, NP  traZODone (DESYREL) 100 MG tablet Take 1 tablet (100 mg total) by mouth at bedtime. For sleep 04/27/14  Yes Encarnacion Slates, NP  chlordiazePOXIDE (LIBRIUM) 25 MG capsule Take 1 capsule (25 mg total) by mouth 4 (four) times daily. X 1 day: #4 capsules Take 1 capsule (25 mg) three times daily x 1 day: #3 capsules Take 1 capsule (25 mg) two times daily x 1 days: #2 capsules Take 1 capsul  (25 mg) one time daily x 1 day: 1 capsule Patient not taking: Reported on 05/03/2014 04/27/14   Encarnacion Slates, NP   BP 130/74 mmHg  Pulse 98  Temp(Src) 99 F (37.2 C) (Oral)  Resp 18  SpO2 97% Physical Exam  Constitutional: He appears well-developed and well-nourished. No distress.  HENT:  Head: Normocephalic and atraumatic.  Eyes: Pupils are equal, round, and reactive to light.  Neck: Normal range of motion. Neck supple.  Cardiovascular: Regular rhythm.  Tachycardia present.   Pulmonary/Chest: Effort normal.  Abdominal: Soft.  Neurological: He is alert.  Skin: Skin is warm and dry.  Psychiatric: His mood appears anxious. He is not withdrawn and not actively hallucinating. He exhibits a depressed mood. He expresses suicidal ideation. He expresses no homicidal ideation. He expresses no homicidal plans. Plan of suicide: unclear if patient has plan. will not answer.  Nursing note and vitals reviewed.   ED Course  Procedures (including  critical care time) Labs Review Labs Reviewed  ACETAMINOPHEN LEVEL - Abnormal; Notable for the following:    Acetaminophen (Tylenol), Serum <10.0 (*)    All other components within normal limits  COMPREHENSIVE METABOLIC PANEL - Abnormal; Notable for the following:    Glucose, Bld 141 (*)    Calcium 8.2 (*)    AST 503 (*)    ALT 445 (*)    All other components within normal limits  ETHANOL - Abnormal; Notable for the following:    Alcohol, Ethyl (B) 314 (*)    All other components within normal limits  URINE RAPID DRUG SCREEN (HOSP PERFORMED) - Abnormal; Notable for the following:    Benzodiazepines POSITIVE (*)    Tetrahydrocannabinol POSITIVE (*)    All other components within normal limits  CBC  SALICYLATE LEVEL    Imaging Review No results found.   EKG Interpretation None      MDM   Final diagnoses:  Alcohol dependence with withdrawal with complication  Elevated transaminase level  Suicidal ideations   Tylenol and Aspirin levels are negative.  ETOH is 314 UDS + for marijuana and benzo  Patients CMP shows elevated AST and ALT, worse than baseline which is normal per our records. He actually had labs done within the past two weeks here and his enzymes were elevated. The patient admits to drinking alcohol around the clock and "constantly nursing a beer". I spoke with Dr. Humphrey Rolls, [facility] hospitalist does not feel that he will need any admission but they have agreed to consult if needed. He feels that as he detox from alcohol his LFTs will resolve. If they do not he will need further work-up at that time. Pt denies IV drugs, OD on tylenol or aspirin.  Holding labs placed TTS consult ordered Home meds reviewed.  Filed Vitals:   05/03/14 1940  BP: 130/74  Pulse: 98  Temp:   Resp: 18       Delos Haring, PA-C 05/03/14 2048  Patient accepted to [facility] and can be transferred once his blood alcohol level improves to less than 200.  Delos Haring, PA-C 05/03/14  2147  Merryl Hacker, MD 05/04/14 2000

## 2014-05-03 NOTE — ED Notes (Signed)
Patient states that he wants alcohol detox, but has PTSD and depression. Patient also c/o feeling suicidal and denies having a plan. Patient states he had a beer about an hour prior to coming in to the ED. Patient states he has a history of seizures when he starts to withdraw from alcohol.

## 2014-05-03 NOTE — BH Assessment (Signed)
Pt presents to ED with hx of depression, anxiety, PTSD and etoh use disorder. Reports worsening depression with SI, and reports concern that he is going through withdrawal. Reports he had one beer yesterday and one today, however BAL was 314 upon arrival.   Requested cart be placed in room.   Assessment to commence shortly.   Lear Ng, [facility] Triage Specialist 05/03/2014 8:33 PM

## 2014-05-03 NOTE — ED Notes (Signed)
Patient reports SI, visualizing spots, and withdraw symptoms. Denies HI. Reports poor sleep d/t PTSD/nightmares.   Encouragement offered. Given Zofran, B1, and gingerale.  Q 15 safety checks continue.

## 2014-05-03 NOTE — ED Notes (Signed)
Pt. and belongings wanded by security 

## 2014-05-04 ENCOUNTER — Encounter (HOSPITAL_COMMUNITY): Payer: Self-pay | Admitting: *Deleted

## 2014-05-04 ENCOUNTER — Inpatient Hospital Stay (HOSPITAL_COMMUNITY)
Admission: AD | Admit: 2014-05-04 | Discharge: 2014-05-08 | DRG: 897 | Disposition: A | Discharge status: Home / Self Care | Payer: Federal, State, Local not specified - Other | Source: Intra-hospital | Attending: Psychiatry | Admitting: Psychiatry

## 2014-05-04 DIAGNOSIS — Y908 Blood alcohol level of 240 mg/100 ml or more: Secondary | ICD-10-CM | POA: Diagnosis present

## 2014-05-04 DIAGNOSIS — F332 Major depressive disorder, recurrent severe without psychotic features: Secondary | ICD-10-CM | POA: Diagnosis present

## 2014-05-04 DIAGNOSIS — F410 Panic disorder [episodic paroxysmal anxiety] without agoraphobia: Secondary | ICD-10-CM | POA: Diagnosis present

## 2014-05-04 DIAGNOSIS — Z811 Family history of alcohol abuse and dependence: Secondary | ICD-10-CM | POA: Diagnosis not present

## 2014-05-04 DIAGNOSIS — R45851 Suicidal ideations: Secondary | ICD-10-CM | POA: Diagnosis present

## 2014-05-04 DIAGNOSIS — F411 Generalized anxiety disorder: Secondary | ICD-10-CM | POA: Diagnosis present

## 2014-05-04 DIAGNOSIS — F17210 Nicotine dependence, cigarettes, uncomplicated: Secondary | ICD-10-CM | POA: Diagnosis present

## 2014-05-04 DIAGNOSIS — G4700 Insomnia, unspecified: Secondary | ICD-10-CM | POA: Diagnosis present

## 2014-05-04 DIAGNOSIS — F10220 Alcohol dependence with intoxication, uncomplicated: Secondary | ICD-10-CM | POA: Diagnosis not present

## 2014-05-04 DIAGNOSIS — F10239 Alcohol dependence with withdrawal, unspecified: Secondary | ICD-10-CM | POA: Insufficient documentation

## 2014-05-04 DIAGNOSIS — F1020 Alcohol dependence, uncomplicated: Secondary | ICD-10-CM | POA: Diagnosis present

## 2014-05-04 DIAGNOSIS — F4310 Post-traumatic stress disorder, unspecified: Secondary | ICD-10-CM | POA: Diagnosis present

## 2014-05-04 DIAGNOSIS — F322 Major depressive disorder, single episode, severe without psychotic features: Secondary | ICD-10-CM | POA: Diagnosis present

## 2014-05-04 LAB — ETHANOL: Alcohol, Ethyl (B): 204 mg/dL — ABNORMAL HIGH (ref 0–9)

## 2014-05-04 MED ORDER — LOPERAMIDE HCL 2 MG PO CAPS
2.0000 mg | ORAL_CAPSULE | ORAL | Status: AC | PRN
  Administered 2014-05-05: 4 mg via ORAL
  Filled 2014-05-04: qty 2

## 2014-05-04 MED ORDER — THIAMINE HCL 100 MG/ML IJ SOLN: 100.0000 mg | Freq: Once | INTRAMUSCULAR | Status: DC

## 2014-05-04 MED ORDER — ADULT MULTIVITAMIN W/MINERALS CH
1.0000 | ORAL_TABLET | Freq: Every day | ORAL | Status: DC
  Administered 2014-05-04 – 2014-05-08 (×5): 1 via ORAL
  Filled 2014-05-04 (×8): qty 1

## 2014-05-04 MED ORDER — ACETAMINOPHEN 325 MG PO TABS
650.0000 mg | ORAL_TABLET | Freq: Four times a day (QID) | ORAL | Status: DC | PRN
  Administered 2014-05-04 – 2014-05-05 (×2): 650 mg via ORAL
  Filled 2014-05-04 (×2): qty 2

## 2014-05-04 MED ORDER — HYDROXYZINE HCL 25 MG PO TABS
25.0000 mg | ORAL_TABLET | Freq: Four times a day (QID) | ORAL | Status: AC | PRN
  Administered 2014-05-04 – 2014-05-06 (×5): 25 mg via ORAL
  Filled 2014-05-04 (×6): qty 1

## 2014-05-04 MED ORDER — NICOTINE 21 MG/24HR TD PT24
21.0000 mg | MEDICATED_PATCH | Freq: Every day | TRANSDERMAL | Status: DC
  Administered 2014-05-04 – 2014-05-08 (×5): 21 mg via TRANSDERMAL
  Filled 2014-05-04 (×6): qty 1

## 2014-05-04 MED ORDER — LORAZEPAM 1 MG PO TABS
1.0000 mg | ORAL_TABLET | Freq: Two times a day (BID) | ORAL | Status: AC
  Administered 2014-05-06 (×2): 1 mg via ORAL
  Filled 2014-05-04 (×2): qty 1

## 2014-05-04 MED ORDER — LORAZEPAM 1 MG PO TABS
1.0000 mg | ORAL_TABLET | Freq: Every day | ORAL | Status: AC
  Administered 2014-05-07: 1 mg via ORAL
  Filled 2014-05-04: qty 1

## 2014-05-04 MED ORDER — LORAZEPAM 1 MG PO TABS
1.0000 mg | ORAL_TABLET | Freq: Four times a day (QID) | ORAL | Status: AC | PRN
Start: 2014-05-04 — End: 2014-05-07
  Administered 2014-05-05 – 2014-05-06 (×2): 1 mg via ORAL
  Filled 2014-05-04 (×2): qty 1

## 2014-05-04 MED ORDER — VITAMIN B-1 100 MG PO TABS
100.0000 mg | ORAL_TABLET | Freq: Every day | ORAL | Status: DC
  Administered 2014-05-05 – 2014-05-08 (×4): 100 mg via ORAL
  Filled 2014-05-04 (×7): qty 1

## 2014-05-04 MED ORDER — ALUM & MAG HYDROXIDE-SIMETH 200-200-20 MG/5ML PO SUSP: 30.0000 mL | ORAL | Status: DC | PRN

## 2014-05-04 MED ORDER — ONDANSETRON 4 MG PO TBDP
4.0000 mg | ORAL_TABLET | Freq: Four times a day (QID) | ORAL | Status: AC | PRN
  Administered 2014-05-04 – 2014-05-06 (×6): 4 mg via ORAL
  Filled 2014-05-04 (×6): qty 1

## 2014-05-04 MED ORDER — MAGNESIUM HYDROXIDE 400 MG/5ML PO SUSP: 30.0000 mL | Freq: Every day | ORAL | Status: DC | PRN

## 2014-05-04 MED ORDER — LORAZEPAM 1 MG PO TABS
1.0000 mg | ORAL_TABLET | Freq: Three times a day (TID) | ORAL | Status: AC
  Administered 2014-05-05 (×3): 1 mg via ORAL
  Filled 2014-05-04 (×3): qty 1

## 2014-05-04 MED ORDER — LORAZEPAM 1 MG PO TABS
1.0000 mg | ORAL_TABLET | Freq: Four times a day (QID) | ORAL | Status: AC
  Administered 2014-05-04 (×3): 1 mg via ORAL
  Filled 2014-05-04 (×3): qty 1

## 2014-05-04 MED ORDER — TRAZODONE HCL 100 MG PO TABS
100.0000 mg | ORAL_TABLET | Freq: Every evening | ORAL | Status: DC | PRN
  Administered 2014-05-04 – 2014-05-07 (×4): 100 mg via ORAL
  Filled 2014-05-04 (×2): qty 1
  Filled 2014-05-04: qty 14
  Filled 2014-05-04 (×2): qty 1

## 2014-05-04 NOTE — Tx Team (Addendum)
Initial Interdisciplinary Treatment Plan   PATIENT STRESSORS: Legal issue Marital or family conflict Medication change or noncompliance Substance abuse   PATIENT STRENGTHS: Average or above average intelligence Capable of independent living General fund of knowledge Work skills   PROBLEM LIST: Problem List/Patient Goals Date to be addressed Date deferred Reason deferred Estimated date of resolution  "to get help with medication that will work" 05/04/14     "I want long-term treatment" 05/04/14     Substance abuse 05/05/2014     Suicidal ideation 05/05/2014                                    DISCHARGE CRITERIA:  Improved stabilization in mood, thinking, and/or behavior Need for constant or close observation no longer present Withdrawal symptoms are absent or subacute and managed without 24-hour nursing intervention  PRELIMINARY DISCHARGE PLAN: Attend aftercare/continuing care group Attend 12-step recovery group Placement in alternative living arrangements  PATIENT/FAMIILY INVOLVEMENT: This treatment plan has been presented to and reviewed with the patient, Chad Morgan. The patien has been given the opportunity to ask questions and make suggestions.  Karel Jarvis 05/04/2014, 11:56 AM

## 2014-05-04 NOTE — Clinical Social Work Note (Signed)
[facility] referral faxed per pt request.   Maxie Better, [place] 05/04/2014 4:56 PM

## 2014-05-04 NOTE — [facility] Group Notes (Signed)
[facility] LCSW Group Therapy  05/04/2014 4:03 PM  Type of Therapy:  Group Therapy  Participation Level:  Did Not Attend-pt invited-chose to remain in room and sleep.   Summary of Progress/Problems: Feelings around Relapse. Group members discussed the meaning of relapse and shared personal stories of relapse, how it affected them and others, and how they perceived themselves during this time. Group members were encouraged to identify triggers, warning signs and coping skills used when facing the possibility of relapse. Social supports were discussed and explored in detail. Post Acute Withdrawal Syndrome (handout provided) was introduced and examined. Pt's were encouraged to ask questions, talk about key points associated with PAWS, and process this information in terms of relapse prevention.   Smart, Sahaj Bona LCSWA  05/04/2014, 4:03 PM
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
## 2014-05-04 NOTE — BHH Suicide Risk Assessment (Signed)
[facility] Admission Suicide Risk Assessment   Nursing information obtained from:    Demographic factors:    Current Mental Status:    Loss Factors:    Historical Factors:    Risk Reduction Factors:    Total Time spent with patient: 45 minutes Principal Problem: <principal problem not specified> Diagnosis:   Patient Active Problem List   Diagnosis Date Noted  . Severe major depression without psychotic features [F32.2] 04/24/2014    Priority: High  . Suicidal ideations [R45.851]     Priority: High  . Alcohol dependence with intoxication with complication [I14.431] 54/00/8676    Priority: High  . Social anxiety disorder [F40.10] 01/09/2014    Priority: High  . Panic attacks [F41.0] 01/09/2014    Priority: High  . Major depressive disorder, recurrent, severe without psychotic features [F33.2]   . PTSD (post-traumatic stress disorder) [F43.10] 04/25/2014  . Alcohol dependence with withdrawal, uncomplicated [P95.093] 26/71/2458  . Tobacco abuse [Z72.0] 04/16/2014  . Alcohol withdrawal [F10.239] 04/16/2014  . Alcohol dependence with uncomplicated withdrawal [K99.833]   . Major depressive disorder, recurrent episode, moderate [F33.1]   . GAD (generalized anxiety disorder) [F41.1] 01/09/2014  . Alcohol dependence [F10.20] 01/08/2014     Continued Clinical Symptoms:  Alcohol Use Disorder Identification Test Final Score (AUDIT): 30 The "Alcohol Use Disorders Identification Test", Guidelines for Use in Primary Care, Second Edition.  World Pharmacologist [facility]). Score between 0-7:  no or low risk or alcohol related problems. Score between 8-15:  moderate risk of alcohol related problems. Score between 16-19:  high risk of alcohol related problems. Score 20 or above:  warrants further diagnostic evaluation for alcohol dependence and treatment.   CLINICAL FACTORS:   Severe Anxiety and/or Agitation Depression:   Comorbid alcohol abuse/dependence Impulsivity Recent sense of  peace/wellbeing Alcohol/Substance Abuse/Dependencies   Musculoskeletal: Strength & Muscle Tone: within normal limits Gait & Station: normal Patient leans: N/A  Psychiatric Specialty Exam: Physical Exam  Review of Systems  Constitutional: Positive for malaise/fatigue and diaphoresis.  Eyes: Negative.   Respiratory: Negative.   Cardiovascular: Negative.   Gastrointestinal: Negative.   Genitourinary: Negative.   Musculoskeletal: Positive for myalgias.  Skin: Negative.   Neurological: Positive for dizziness, weakness and headaches.  Endo/Heme/Allergies: Negative.   Psychiatric/Behavioral: Positive for depression, suicidal ideas and substance abuse. The patient is nervous/anxious and has insomnia.     Blood pressure 113/74, pulse 97, temperature 98.2 F (36.8 C), temperature source Oral, resp. rate 18, height 6\' 2"  (1.88 m), weight 108.41 kg (239 lb), SpO2 99 %.Body mass index is 30.67 kg/(m^2).  General Appearance: Disheveled  Eye Contact::  Minimal  Speech:  Clear and Coherent, Slow and not spontaneous  Volume:  Decreased  Mood:  Anxious, Depressed and Euphoric  Affect:  Restricted and anxious worried  Thought Process:  Coherent and Goal Directed  Orientation:  Other:  place time  Thought Content:  symtpoms events worries concerns  Suicidal Thoughts:  Yes.  without intent/plan  Homicidal Thoughts:  No  Memory:  Immediate;   Fair Recent;   Fair Remote;   Fair  Judgement:  Fair  Insight:  Present  Psychomotor Activity:  Restlessness  Concentration:  Fair  Recall:  [employer] of Knowledge:Fair  Language: Fair  Akathisia:  No  Handed:  Right  AIMS (if indicated):     Assets:  Desire for Improvement  Sleep:     Cognition: WNL  ADL's:  Intact     COGNITIVE FEATURES THAT CONTRIBUTE TO RISK:  Closed-mindedness, Polarized thinking and Thought constriction (tunnel vision)    SUICIDE RISK:   Moderate:  Frequent suicidal ideation with limited intensity, and duration,  some specificity in terms of plans, no associated intent, good self-control, limited dysphoria/symptomatology, some risk factors present, and identifiable protective factors, including available and accessible social support. 34 Y/O male who was recently D/C from our unit after being detox. He had not looked at going into rehab after detox but this last time he was receptive of going to [facility] in [place]. A bed was not available at the time of D/C so he was going to continue to call them, meanwhile he was going to an [employer]. He did not make it to the [facility]. He relapsed immediately and this triggered the same cycle of increased depression with suicidal ideas. Once  trapped in this cycle he cant stop drinking. He came for help. He is an ex-marine with some PTSD not wanting until last time to pursue services trough the [place]. This again was going to be one of his goals but once he took that first drink, all the goals went down the drain (my words) PLAN OF CARE: Supportive approach/coping skills                               Alcohol Dependence: Ativan Detox protocol ( his liver enzymes are markedly elevated) will work a relapse prevention plan. Will explore residential treatment options Depression; will pursue an SSRI and work with CBT/minfulness PTSD; will facilitate him opening up about the events that happened while in the service   Medical Decision Making:  Review of Psycho-Social Stressors (1), Review or order clinical lab tests (1), Review of Medication Regimen & Side Effects (2) and Review of New Medication or Change in Dosage (2)  I certify that inpatient services furnished can reasonably be expected to improve the patient's condition.   Chad Morgan A 05/04/2014, 3:50 PM

## 2014-05-04 NOTE — BH Assessment (Addendum)
Attempted to inform PA that pt could now come to [facility] now that BAL is at 204. Will attempt to call back.   Informed Megan RN that pt can come anytime.   Informed Dayna Barker and she will complete MTALA.   Support paperwork is needed.   Lear Ng, [facility] Triage Specialist 05/04/2014 2:13 AM

## 2014-05-04 NOTE — Progress Notes (Signed)
Patient currently drowsy w/ low BP, elevated HR. Patient will transfer to [facility] [phone]. Report called to Vaughan Basta, [place] at [facility].

## 2014-05-04 NOTE — BHH Counselor (Signed)
Adult Comprehensive Assessment  Patient ID: Chad Morgan, male DOB: 1980/03/24, 34 y.o. MRN: [id number]  Information Source: Information source: Patient  Current Stressors:  Educational / Learning stressors: None Employment / Job issues: Patient has been unemployed for several months due to substance abuse.  Family Relationships: None Financial / Lack of resources (include bankruptcy): Struggling due to no source of income Housing / Lack of housing: Patient is homeless/had been living in [place] but relapsed.  Physical health (include injuries & life threatening diseases): None Social relationships: Does not like to be around people Substance abuse: Patient reports drinking a fifth to a half gallon on liquor daily Bereavement / Loss: None  Living/Environment/Situation:  Living Arrangements: [facility] in [facility]/kicked out due to relapse.  Living conditions (as described by patient or guardian): Homeless How long has patient lived in current situation?: since 2012 What is atmosphere in current home: Temporary/chaotic   Family History:  Marital status: Separated Separated, when?: 2012 What types of issues is patient dealing with in the relationship?: I don't plan to go back to my ex. Pt reports that his gf is an alcoholic as well.  Additional relationship information: N/A Does patient have children?: Yes How many children?: 3 How is patient's relationship with their children?: Okay relationship with children ages 70,9, and 17.   Childhood History:  By whom was/is the patient raised?: Father, Both parents Additional childhood history information: Not good - patient very guarded in his response Description of patient's relationship with caregiver when they were a child: Difficult Patient's description of current relationship with people who raised him/her: Difficult Does patient have siblings?: Yes Number of Siblings: 2 Description of patient's current relationship  with siblings: okay with half brother but not good with step sister Did patient suffer any verbal/emotional/physical/sexual abuse as a child?: No Did patient suffer from severe childhood neglect?: No Has patient ever been sexually abused/assaulted/raped as an adolescent or adult?: No Was the patient ever a victim of a crime or a disaster?: No Witnessed domestic violence?: No Has patient been effected by domestic violence as an adult?: No  Education:  Highest grade of school patient has completed: Psychiatrist Currently a student?: No Learning disability?: No  Employment/Work Situation:  Employment situation: Unemployed Patient's job has been impacted by current illness: No What is the longest time patient has a held a job?: Seven years 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 Pt reports that he is not [facility] connected.  Does patient have a representative payee or guardian?: No  Alcohol/Substance Abuse:  If attempted suicide, did drugs/alcohol play a role in this?: No Alcohol/Substance Abuse Treatment Hx: Past Tx, Inpatient If yes, describe treatment: [facility] - several years ago. [place] two weeks ago for detox.  Has alcohol/substance abuse ever caused legal problems?: No  Social Support System:  Heritage manager System: None identified  Describe Community Support System: N/A Type of faith/religion: None How does patient's faith help to cope with current illness?: N/A  Leisure/Recreation:  Leisure and Hobbies: None at this time  Strengths/Needs:  What things does the patient do well?: Good work history. Motivated to get help dealing with cravings, alcoholism, and PTSD.  In what areas does patient struggle / problems for patient: Anxiety and depression; PTSD; sleep.   Discharge Plan:   Does patient have  access to transportation?: Yes-bus  Will patient be returning to same living situation after discharge?: no. Pt requesting [facility] or [facility] referrals.  Currently receiving community mental health services: No If no, would patient like referral for services when discharged?:[facility]. [facility] for outpatient. Pt has [facility] information already and [employer].  Does patient have financial barriers related to discharge medications?: Yes Patient description of barriers related to discharge medications: No income or insurance.  Summary/Recommendations: Chad Morgan is a 34 years old Caucasian male admitted with Alcohol Abuse Disorder, depression, PTSD, SI, and for medication stabilization. He recently discharged from [facility] a few weeks ago but reports that he relapsed shortly after entering an [place]. Prior to this, pt was at [facility] for detox in Dec 2015. Recommendations for pt include: crisis stabilization, therapeutic milieu, encourage group attendance and participation, librium taper for withdrawals, medication management for mood stabilization, and development of comprehensive mental wellness/sobriety plan. Pt requesting [facility] referral and [facility] referral.  and [place] list-completed by CSW/given to pt. Pt also agreeable to [facility] referral for outpatient services and [facility] for orange card sign up-appt scheduled from last visit. CSW assessing for appropriate referrals.   Smart, Water quality scientist. 05/04/2014 4:28 PM

## 2014-05-04 NOTE — BHH Suicide Risk Assessment (Signed)
[place] INPATIENT:  Family/Significant Other Suicide Prevention Education  Suicide Prevention Education:  Patient Refusal for Family/Significant Other Suicide Prevention Education: The patient Chad Morgan has refused to provide written consent for family/significant other to be provided Family/Significant Other Suicide Prevention Education during admission and/or prior to discharge.  Physician notified.  SPE completed with pt and he was encouraged to share information with support network, ask questions, and talk about any concerns relating to SPE. Pt denies SI/HI/AVH and denies access to guns/firearms. Pt reports no social supports or family supports at this time.   Smart, [name] LCSWA  05/04/2014, 4:32 PM

## 2014-05-04 NOTE — Discharge Instructions (Signed)
TRANSFER TO [place].

## 2014-05-05 DIAGNOSIS — F10220 Alcohol dependence with intoxication, uncomplicated: Secondary | ICD-10-CM

## 2014-05-05 DIAGNOSIS — F322 Major depressive disorder, single episode, severe without psychotic features: Secondary | ICD-10-CM

## 2014-05-05 DIAGNOSIS — R45851 Suicidal ideations: Secondary | ICD-10-CM

## 2014-05-05 MED ORDER — CITALOPRAM HYDROBROMIDE 20 MG PO TABS
20.0000 mg | ORAL_TABLET | Freq: Every day | ORAL | Status: DC
  Administered 2014-05-06 – 2014-05-08 (×3): 20 mg via ORAL
  Filled 2014-05-05 (×5): qty 1
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
## 2014-05-05 NOTE — Progress Notes (Signed)
Patient ID: Chad Morgan, male   DOB: 07-13-1980, 34 y.o.   MRN: [id number] D: Patient alert and cooperative. Pt detoxing from [place] and c/o tremors, generalize pain, and anxiety. Pt mood/affect is depressed and flat. Pt denies SI/HI/AVH. No acute distressed noted at this time.   A: Medications administered as prescribed. Emotional support given and will continue to monitor pt's progress for stabilization.  R: Patient remains safe and complaint with medications.

## 2014-05-05 NOTE — Plan of Care (Signed)
Problem: Diagnosis: Increased Risk For Suicide Attempt Goal: STG-Patient Will Comply With Medication Regime Outcome: Progressing Pt compliant with scheduled medication regime

## 2014-05-05 NOTE — [facility] Group Notes (Signed)
[facility] Group Notes: (Clinical Social Work)   05/05/2014      Type of Therapy:  Group Therapy   Participation Level:  Did Not Attend despite MHT prompting   Selmer Dominion, LCSW 05/05/2014, 2:32 PM

## 2014-05-05 NOTE — [facility] Group Notes (Signed)
[facility] Group Notes:  (Nursing/MHT/Case Management/Adjunct)  Date:  05/05/2014  Time:  11:08 AM  Type of Therapy:  Psychoeducational Skills  Participation Level:  Did Not Attend  Participation Quality:  Did not attend  Affect:  did not attend  Cognitive:  did not attend  Insight:  None  Engagement in Group:  None  Modes of Intervention:  Discussion and Education  Summary of Progress/Problems: This group was to discuss the topic of the day which is healthy coping skills. Patient was invited but did not attend. 

## 2014-05-05 NOTE — Progress Notes (Signed)
D: Patient remains isolative to room.  He presents with anxious and depressed mood.  Patient is tremulous and agitated.  His CIWA is a 9.  He asked if his ativan could be increased to 2 mg.  Informed patient that he has ativan prn if he needs it.  He denies SI/HI/AVH. He has not attended groups today due to not feeling well. A: Continue to monitor medication management and MD orders.  Safety checks completed every 15 minutes per protocol.  Meet 1:1 with patient to discuss concerns and offer encouragement. R: Patient's behavior is appropriate to situation.

## 2014-05-05 NOTE — [facility] Group Notes (Signed)
The focus of this group is to educate the patient on the purpose and policies of crisis stabilization and provide a format to answer questions about their admission.  The group details unit policies and expectations of patients while admitted.  Patient was invited to group but chose not to attend.  

## 2014-05-06 NOTE — Plan of Care (Signed)
Problem: Alteration in mood & ability to function due to Goal: LTG-Pt reports reduction in suicidal thoughts (Patient reports reduction in suicidal thoughts and is able to verbalize a safety plan for whenever patient is feeling suicidal)  Outcome: Progressing Pt denied SI this shift.  He verbally contracted for safety.   Goal: STG-Patient will attend groups Outcome: Progressing Pt attended evening group on 05/05/14.

## 2014-05-06 NOTE — [facility] Group Notes (Signed)
[place] Group Notes:  (Nursing/MHT/Case Management/Adjunct)  Date:  05/06/2014  Time: 0915 am  Type of Therapy:  Psychoeducational Skills  Participation Level:  Did Not Attend  Zipporah Plants 05/06/2014, 10:23 AM

## 2014-05-06 NOTE — [facility] Group Notes (Signed)
[facility] Group Notes: (Clinical Social Work)   05/06/2014      Type of Therapy:  Group Therapy   Participation Level:  Did Not Attend despite MHT prompting   Selmer Dominion, LCSW 05/06/2014, 4:13 PM

## 2014-05-06 NOTE — Progress Notes (Signed)
[facility] MD Progress Note  05/06/2014 1:31 PM Chad Morgan  MRN:  [id number] Subjective:  Patient seen face-to-face for this evaluation. Patient reported he has been getting adjusted to the therapeutic milieu and has been taking medication as prescribed for depression, anxiety and alcohol withdrawal symptoms. Patient reported his depression as 7 out of 10 and anxiety 9 out of 10 and his tremors are better but continued to have sweating and nausea. Patient has a disturbed sleep reportedly slept 6 and half hours last night by staff. Patient reported he does not have any more vomiting's and able to eat his breakfast this morning. Patient denies safety concerns at this time and also no hallucinations.  Principal Problem: Alcohol dependence Diagnosis:   Patient Active Problem List   Diagnosis Date Noted  . Major depressive disorder, recurrent, severe without psychotic features [F33.2]   . PTSD (post-traumatic stress disorder) [F43.10] 04/25/2014  . Severe major depression without psychotic features [F32.2] 04/24/2014  . Suicidal ideations [R45.851]   . Alcohol dependence with withdrawal, uncomplicated [L89.373] 42/87/6811  . Alcohol dependence with intoxication with complication [X72.620] 35/59/7416  . Tobacco abuse [Z72.0] 04/16/2014  . Alcohol withdrawal [F10.239] 04/16/2014  . Alcohol dependence with uncomplicated withdrawal [L84.536]   . Major depressive disorder, recurrent episode, moderate [F33.1]   . Social anxiety disorder [F40.10] 01/09/2014  . Panic attacks [F41.0] 01/09/2014  . GAD (generalized anxiety disorder) [F41.1] 01/09/2014  . Alcohol dependence [F10.20] 01/08/2014   Total Time spent with patient: 30 minutes   Past Medical History:  Past Medical History  Diagnosis Date  . Anxiety   . Depression   . Alcohol abuse   . PTSD (post-traumatic stress disorder)   . Seizures     with withdrawal    Past Surgical History  Procedure Laterality Date  . Wisdom tooth extraction      Family History:  Family History  Problem Relation Age of Onset  . Alcoholism Father    Social History:  History  Alcohol Use  . Yes    Comment:  1/2 gallon of liquor daily     History  Drug Use  . Yes  . Special: Benzodiazepines, Marijuana    Comment: Patient denies     History   Social History  . Marital Status: Legally Separated    Spouse Name: N/A  . Number of Children: N/A  . Years of Education: N/A   Social History Main Topics  . Smoking status: Current Every Day Smoker -- 1.00 packs/day for 16 years    Types: Cigarettes  . Smokeless tobacco: Never Used  . Alcohol Use: Yes     Comment:  1/2 gallon of liquor daily  . Drug Use: Yes    Special: Benzodiazepines, Marijuana     Comment: Patient denies   . Sexual Activity: Yes   Other Topics Concern  . None   Social History Narrative   Additional History:    Sleep: Poor  Appetite:  Fair   Assessment:   Musculoskeletal: Strength & Muscle Tone: decreased Gait & Station: normal Patient leans: N/A   Psychiatric Specialty Exam: Physical Exam  ROS  Blood pressure 121/72, pulse 101, temperature 97.6 F (36.4 C), temperature source Oral, resp. rate 18, height 6\' 2"  (1.88 m), weight 108.41 kg (239 lb), SpO2 99 %.Body mass index is 30.67 kg/(m^2).  General Appearance: Disheveled  Eye Sport and exercise psychologist::  Fair  Speech:  Clear and Coherent and Slow  Volume:  Decreased  Mood:  Anxious and Depressed  Affect:  Constricted and Depressed  Thought Process:  Coherent and Goal Directed  Orientation:  Full (Time, Place, and Person)  Thought Content:  WDL  Suicidal Thoughts:  No  Homicidal Thoughts:  No  Memory:  Immediate;   Fair Recent;   Fair  Judgement:  Intact  Insight:  Fair  Psychomotor Activity:  Decreased  Concentration:  Fair  Recall:  [employer] of Knowledge:Good  Language: Good  Akathisia:  Negative  Handed:  Right  AIMS (if indicated):     Assets:  Communication Skills Desire for  Improvement Housing Intimacy Leisure Time Resilience Social Support Talents/Skills  ADL's:  Intact  Cognition: WNL  Sleep:  Number of Hours: 6.5     Current Medications: Current Facility-Administered Medications  Medication Dose Route Frequency Provider Last Rate Last Dose  . acetaminophen (TYLENOL) tablet 650 mg  650 mg Oral Q6H PRN Nicholaus Bloom, MD   650 mg at 05/05/14 1947  . alum & mag hydroxide-simeth (MAALOX/MYLANTA) 200-200-20 MG/5ML suspension 30 mL  30 mL Oral Q4H PRN Nicholaus Bloom, MD      . citalopram (CELEXA) tablet 20 mg  20 mg Oral Daily Ambrose Finland, MD   20 mg at 05/06/14 0804  . hydrOXYzine (ATARAX/VISTARIL) tablet 25 mg  25 mg Oral Q6H PRN Nicholaus Bloom, MD   25 mg at 05/06/14 1249  . loperamide (IMODIUM) capsule 2-4 mg  2-4 mg Oral PRN Nicholaus Bloom, MD   4 mg at 05/05/14 1947  . LORazepam (ATIVAN) tablet 1 mg  1 mg Oral Q6H PRN Nicholaus Bloom, MD   1 mg at 05/05/14 2139  . LORazepam (ATIVAN) tablet 1 mg  1 mg Oral BID Nicholaus Bloom, MD   1 mg at 05/06/14 0804   Followed by  . [START ON 05/07/2014] LORazepam (ATIVAN) tablet 1 mg  1 mg Oral Daily Nicholaus Bloom, MD      . magnesium hydroxide (MILK OF MAGNESIA) suspension 30 mL  30 mL Oral Daily PRN Nicholaus Bloom, MD      . multivitamin with minerals tablet 1 tablet  1 tablet Oral Daily Nicholaus Bloom, MD   1 tablet at 05/06/14 0804  . nicotine (NICODERM CQ - dosed in mg/24 hours) patch 21 mg  21 mg Transdermal Q0600 Kerrie Buffalo, NP   21 mg at 05/06/14 0804  . ondansetron (ZOFRAN-ODT) disintegrating tablet 4 mg  4 mg Oral Q6H PRN Nicholaus Bloom, MD   4 mg at 05/06/14 3474  . thiamine (B-1) injection 100 mg  100 mg Intramuscular Once Nicholaus Bloom, MD   100 mg at 05/04/14 1244  . thiamine (VITAMIN B-1) tablet 100 mg  100 mg Oral Daily Nicholaus Bloom, MD   100 mg at 05/06/14 0804  . traZODone (DESYREL) tablet 100 mg  100 mg Oral QHS PRN Harriet Butte, NP   100 mg at 05/05/14 2123    Lab Results: No results  found for this or any previous visit (from the past 48 hour(s)).  Physical Findings: AIMS: Facial and Oral Movements Muscles of Facial Expression: None, normal Lips and Perioral Area: None, normal Jaw: None, normal Tongue: None, normal,Extremity Movements Upper (arms, wrists, hands, fingers): None, normal Lower (legs, knees, ankles, toes): None, normal, Trunk Movements Neck, shoulders, hips: None, normal, Overall Severity Severity of abnormal movements (highest score from questions above): None, normal Incapacitation due to abnormal movements: None, normal Patient's awareness of abnormal movements (rate only patient's report):  No Awareness, Dental Status Current problems with teeth and/or dentures?: No Does patient usually wear dentures?: No  CIWA:  CIWA-Ar Total: 6 COWS:     Treatment Plan Summary: Daily contact with patient to assess and evaluate symptoms and progress in treatment and Medication management   PlanTreatment Plan/Recommendations:   1. Admit for crisis management and stabilization. 2. Medication management to reduce current symptoms to base line and improve the patient's overall level of functioning. Continue Celexa 20 mg daily for depression and with the intention to titrate up to 40 mg as clinically required Ativan for alcohol withdrawal symptoms and timing for supportive therapy 3. Treat health problems as indicated. 4. Develop treatment plan to decrease risk of relapse upon discharge and to reduce the need for readmission. 5. Psycho-social education regarding relapse prevention and self care. 6. Health care follow up as needed for medical problems. 7. Restart home medications where appropriate.    Medical Decision Making:  Review of Psycho-Social Stressors (1), Review or order clinical lab tests (1), Established Problem, Worsening (2), Review or order medicine tests (1), Review of Medication Regimen & Side Effects (2) and Review of New Medication or Change in  Dosage (2)     Nickey Kloepfer,JANARDHAHA R. 05/06/2014, 1:31 PM

## 2014-05-06 NOTE — Plan of Care (Signed)
Problem: Consults Goal: [facility] General Treatment Patient Education Outcome: Not Progressing Patient is not participating in group therapy.

## 2014-05-06 NOTE — Progress Notes (Signed)
D: Pt has anxious, depressed affect and anxious mood.  Pt expressed concerns related to withdrawal symptoms.  He had withdrawal symptoms of anxiety, agitation, sweats, tremor.  Pt reports he had diarrhea.  Pt reports he had nausea during the day, but denies this shift.  Pt reports his goal today was "just to make it through the day."  Pt had minimal interactions with peers but was visible in the milieu.  He attended evening group.  Pt denies SI/HI, denies hallucinations.  Pt reported generalized aches of 5/10. A: Introduced self to pt.  Met with pt 1:1 and provided support and encouragement.  Actively listened to pt.  PRN medication administered for withdrawal symptoms, pain, diarrhea, and sleep.  Explained withdrawal protocol to pt and that his scheduled medication for withdrawal is being tapered down.  Pt verbalized understanding.  PO fluids encouraged and provided. R: Pt is compliant with PRN medications.  Pt verbally contracts for safety and reports that he will notify staff of needs and concerns.  Will continue to monitor and assess.

## 2014-05-06 NOTE — Progress Notes (Signed)
D: Patient continues to complain of severe withdrawal symptoms.  He has minimal interaction with staff and others.  Patient is tremulous, anxious and depressed.  He denies SI/HI/AVH.  Patient has not been attending groups or participating in his tx.  He refused to fill out his self inventory sheet.  Patient had to be prompted to come to med window. A: Continue to monitor medication management and MD orders.  Safety checks completed every 15 minutes per protocol.  Meet 1:1 with patient to discuss concerns and offer encouragement. R: Patient is isolative to his room.

## 2014-05-06 NOTE — Progress Notes (Signed)
Pt attended AA group with guest speaker @ 20:00 

## 2014-05-07 MED ORDER — GABAPENTIN 300 MG PO CAPS
300.0000 mg | ORAL_CAPSULE | Freq: Three times a day (TID) | ORAL | Status: DC
Start: 2014-05-07 — End: 2014-05-08
  Administered 2014-05-07 – 2014-05-08 (×2): 300 mg via ORAL
  Filled 2014-05-07 (×5): qty 1

## 2014-05-07 MED ORDER — QUETIAPINE FUMARATE 50 MG PO TABS
50.0000 mg | ORAL_TABLET | Freq: Once | ORAL | Status: AC
  Administered 2014-05-07: 50 mg via ORAL
  Filled 2014-05-07 (×2): qty 1

## 2014-05-07 MED ORDER — GABAPENTIN 300 MG PO CAPS: 300.0000 mg | ORAL_CAPSULE | Freq: Three times a day (TID) | ORAL | Status: DC

## 2014-05-07 MED ORDER — LORAZEPAM 1 MG PO TABS
1.0000 mg | ORAL_TABLET | Freq: Four times a day (QID) | ORAL | Status: DC | PRN
  Administered 2014-05-07: 1 mg via ORAL
  Filled 2014-05-07: qty 1

## 2014-05-07 MED ORDER — QUETIAPINE FUMARATE 50 MG PO TABS
50.0000 mg | ORAL_TABLET | Freq: Three times a day (TID) | ORAL | Status: DC
  Administered 2014-05-07 – 2014-05-08 (×2): 50 mg via ORAL
  Filled 2014-05-07 (×6): qty 1

## 2014-05-07 MED ORDER — GABAPENTIN 300 MG PO CAPS
300.0000 mg | ORAL_CAPSULE | Freq: Once | ORAL | Status: AC
  Administered 2014-05-07: 300 mg via ORAL
  Filled 2014-05-07: qty 1

## 2014-05-07 MED ORDER — ACAMPROSATE CALCIUM 333 MG PO TBEC
666.0000 mg | DELAYED_RELEASE_TABLET | Freq: Three times a day (TID) | ORAL | Status: DC
  Administered 2014-05-07 – 2014-05-08 (×2): 666 mg via ORAL
  Filled 2014-05-07 (×5): qty 2

## 2014-05-07 MED ORDER — QUETIAPINE FUMARATE 50 MG PO TABS: 50.0000 mg | ORAL_TABLET | Freq: Three times a day (TID) | ORAL | Status: DC

## 2014-05-07 NOTE — [facility] Group Notes (Signed)
[place] LCSW Group Therapy  05/07/2014 12:48 PM  Type of Therapy:  Group Therapy  Participation Level:  Did Not Attend-pt was meeting with MD and did not come to group afterward. CSW checked in with pt after group.   Summary of Progress/Problems: Today's Topic: Overcoming Obstacles. Patients identified one short term goal and potential obstacles in reaching this goal. Patients processed barriers involved in overcoming these obstacles. Patients identified steps necessary for overcoming these obstacles and explored motivation (internal and external) for facing these difficulties head on.    Smart, Derrik Mceachern LCSWA  05/07/2014, 12:48 PM

## 2014-05-07 NOTE — Progress Notes (Signed)
D: Pt has depressed affect and mood.  Pt reports he feels "a little bit better, still having tremors."  Pt reports his goal today was to "keep getting over these withdrawals."  Pt denies SI/HI, denies hallucinations, denies pain.  Pt reported that today he "talked to the doctor" and that it "went good."  Pt reported that he wakes up in the middle of the night sweating and shaking.  Encouraged pt to report all withdrawal symptoms to Probation officer when they occur.  Pt has been visible in milieu.  Pt attended evening group.   A: Met with pt 1:1 and provided support and encouragement.  Actively listened to pt.  PRN medication administered for withdrawal symptoms and sleep. R: Pt is compliant with medications.  Pt verbally contracts for safety and reports he will notify staff of needs and concerns.  Will continue to monitor and assess.
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
## 2014-05-07 NOTE — Progress Notes (Signed)
[facility] MD Progress Note  05/07/2014 4:28 PM Chad Morgan  MRN:  [id number] Subjective:  Chad Morgan admits he is experiencing a lot of anxiety building up to feeling agitated. States when he feels  like this he "goes to the bottle." He is endorsing having cravings for alcohol. He states that anxiety is not really triggered. He experiences these episodes frequently during the day. He admits he was not as open as to what he has been going trough but states he cant handle this on his own anymore Principal Problem: Alcohol dependence Diagnosis:   Patient Active Problem List   Diagnosis Date Noted  . Severe major depression without psychotic features [F32.2] 04/24/2014    Priority: High  . Suicidal ideations [R45.851]     Priority: High  . Alcohol dependence with intoxication with complication [Q11.941] 74/08/1446    Priority: High  . Social anxiety disorder [F40.10] 01/09/2014    Priority: High  . Panic attacks [F41.0] 01/09/2014    Priority: High  . Alcohol dependence [F10.20] 01/08/2014    Priority: High  . PTSD (post-traumatic stress disorder) [F43.10] 04/25/2014    Priority: Medium  . Major depressive disorder, recurrent, severe without psychotic features [F33.2]   . Alcohol dependence with withdrawal, uncomplicated [J85.631] 49/70/2637  . Tobacco abuse [Z72.0] 04/16/2014  . Alcohol withdrawal [F10.239] 04/16/2014  . Alcohol dependence with uncomplicated withdrawal [C58.850]   . Major depressive disorder, recurrent episode, moderate [F33.1]   . GAD (generalized anxiety disorder) [F41.1] 01/09/2014   Total Time spent with patient: 30 minutes   Past Medical History:  Past Medical History  Diagnosis Date  . Anxiety   . Depression   . Alcohol abuse   . PTSD (post-traumatic stress disorder)   . Seizures     with withdrawal    Past Surgical History  Procedure Laterality Date  . Wisdom tooth extraction     Family History:  Family History  Problem Relation Age of Onset  . Alcoholism  Father    Social History:  History  Alcohol Use  . Yes    Comment:  1/2 gallon of liquor daily     History  Drug Use  . Yes  . Special: Benzodiazepines, Marijuana    Comment: Patient denies     History   Social History  . Marital Status: Legally Separated    Spouse Name: N/A  . Number of Children: N/A  . Years of Education: N/A   Social History Main Topics  . Smoking status: Current Every Day Smoker -- 1.00 packs/day for 16 years    Types: Cigarettes  . Smokeless tobacco: Never Used  . Alcohol Use: Yes     Comment:  1/2 gallon of liquor daily  . Drug Use: Yes    Special: Benzodiazepines, Marijuana     Comment: Patient denies   . Sexual Activity: Yes   Other Topics Concern  . None   Social History Narrative   Additional History:    Sleep: Fair  Appetite:  Fair   Assessment:   Musculoskeletal: Strength & Muscle Tone: within normal limits Gait & Station: normal Patient leans: N/A   Psychiatric Specialty Exam: Physical Exam  Review of Systems  Constitutional: Negative.   HENT: Negative.   Eyes: Negative.   Respiratory: Negative.   Cardiovascular: Negative.   Gastrointestinal: Negative.   Genitourinary: Negative.   Musculoskeletal: Negative.   Skin: Negative.   Neurological: Negative.   Endo/Heme/Allergies: Negative.   Psychiatric/Behavioral: Positive for depression and substance abuse. The patient  is nervous/anxious.     Blood pressure 128/81, pulse 98, temperature 98.2 F (36.8 C), temperature source Oral, resp. rate 18, height 6\' 2"  (1.88 m), weight 108.41 kg (239 lb), SpO2 99 %.Body mass index is 30.67 kg/(m^2).  General Appearance: Fairly Groomed  Engineer, water::  Fair  Speech:  Clear and Coherent and not spontaneous, reserved, guarded  Volume:  Decreased  Mood:  Anxious, Depressed, Dysphoric and worried  Affect:  Depressed and anxious worried  Thought Process:  Coherent and Goal Directed  Orientation:  Full (Time, Place, and Person)   Thought Content:  symptoms envents worries concerns  Suicidal Thoughts:  No  Homicidal Thoughts:  No  Memory:  Immediate;   Fair Recent;   Fair Remote;   Fair  Judgement:  Fair  Insight:  Present and Shallow  Psychomotor Activity:  Restlessness  Concentration:  Fair  Recall:  [employer] of Knowledge:Fair  Language: Fair  Akathisia:  No  Handed:  Right  AIMS (if indicated):     Assets:  Desire for Improvement  ADL's:  Intact  Cognition: WNL  Sleep:  Number of Hours: 6.5     Current Medications: Current Facility-Administered Medications  Medication Dose Route Frequency Provider Last Rate Last Dose  . acamprosate (CAMPRAL) tablet 666 mg  666 mg Oral TID WC Nicholaus Bloom, MD      . acetaminophen (TYLENOL) tablet 650 mg  650 mg Oral Q6H PRN Nicholaus Bloom, MD   650 mg at 05/05/14 1947  . alum & mag hydroxide-simeth (MAALOX/MYLANTA) 200-200-20 MG/5ML suspension 30 mL  30 mL Oral Q4H PRN Nicholaus Bloom, MD      . citalopram (CELEXA) tablet 20 mg  20 mg Oral Daily Ambrose Finland, MD   20 mg at 05/07/14 0811  . gabapentin (NEURONTIN) capsule 300 mg  300 mg Oral TID Nicholaus Bloom, MD   300 mg at 05/07/14 1419  . magnesium hydroxide (MILK OF MAGNESIA) suspension 30 mL  30 mL Oral Daily PRN Nicholaus Bloom, MD      . multivitamin with minerals tablet 1 tablet  1 tablet Oral Daily Nicholaus Bloom, MD   1 tablet at 05/07/14 0810  . nicotine (NICODERM CQ - dosed in mg/24 hours) patch 21 mg  21 mg Transdermal Q0600 Kerrie Buffalo, NP   21 mg at 05/07/14 0809  . QUEtiapine (SEROQUEL) tablet 50 mg  50 mg Oral TID Nicholaus Bloom, MD   50 mg at 05/07/14 1419  . thiamine (B-1) injection 100 mg  100 mg Intramuscular Once Nicholaus Bloom, MD   100 mg at 05/04/14 1244  . thiamine (VITAMIN B-1) tablet 100 mg  100 mg Oral Daily Nicholaus Bloom, MD   100 mg at 05/07/14 0810  . traZODone (DESYREL) tablet 100 mg  100 mg Oral QHS PRN Harriet Butte, NP   100 mg at 05/06/14 2210    Lab Results: No results  found for this or any previous visit (from the past 48 hour(s)).  Physical Findings: AIMS: Facial and Oral Movements Muscles of Facial Expression: None, normal Lips and Perioral Area: None, normal Jaw: None, normal Tongue: None, normal,Extremity Movements Upper (arms, wrists, hands, fingers): None, normal Lower (legs, knees, ankles, toes): None, normal, Trunk Movements Neck, shoulders, hips: None, normal, Overall Severity Severity of abnormal movements (highest score from questions above): None, normal Incapacitation due to abnormal movements: None, normal Patient's awareness of abnormal movements (rate only patient's report): No Awareness, Dental  Status Current problems with teeth and/or dentures?: No Does patient usually wear dentures?: No  CIWA:  CIWA-Ar Total: 1 COWS:  COWS Total Score: 3  Treatment Plan Summary: Daily contact with patient to assess and evaluate symptoms and progress in treatment and Medication management Alcohol dependence: complete the detox/work a relapse prevention plan Alcohol Cravings: start Campral 333 mg two tid Anxiety-agitation; trial with Seroquel 50/Neurontin 300 mg TID/CBT,muindfulness Facilitate admission to a residential treatment program  Medical Decision Making:  Review of Psycho-Social Stressors (1), Review or order clinical lab tests (1), Review of Medication Regimen & Side Effects (2) and Review of New Medication or Change in Dosage (2)     Rogerio Boutelle A 05/07/2014, 4:28 PM

## 2014-05-07 NOTE — Progress Notes (Signed)
Pt reports he is still having withdrawal symptoms even though his protocol has ended.  He says he is having shakes and his anxiety is still elevated.  Writer spoke to PA who reordered the Ativan 1 mg q6h prn and pt was given a dose along with a sleep aid.  Pt states his plan is to go to a 14 day program after detox.  He was started on Campral for cravings.  Pt has been pleasant and cooperative on the unit.  He denies SI/HI/AV at this time.  He says he has been going to groups.  Pt makes his needs known to staff.  Support and encouragement offered.  Safety maintained with q15 minute checks.

## 2014-05-07 NOTE — Progress Notes (Signed)
Recreation Therapy Notes  Date: 05.02.2016 Time: 9:30am Location: [address] Group Room   Group Topic: Stress Management  Goal Area(s) Addresses:  Patient will actively participate in stress management techniques presented during session.   Behavioral Response: Did not attend.     Laureen Ochs Dedrick Heffner, LRT/CTRS  Lane Hacker 05/07/2014 4:56 PM

## 2014-05-07 NOTE — Plan of Care (Signed)
Problem: Consults Goal: Suicide Risk Patient Education (See Patient Education module for education specifics)  Outcome: Completed/Met Date Met:  05/07/14 Nurse discussed suicidal thoughts/coping skills with patient.

## 2014-05-07 NOTE — Progress Notes (Signed)
D:  Patient's self inventory sheet, patient has fair sleep, sleep medication helpful.  Good appetite, normal energy level, good concentration.  Rated depression, hopeless and anxiety #7.   Denied SI.  Denied physical problems.  Denied pain.  Goal is to work on discharge plan.  Plans to talk to MD and SW.  No discharge plans.  No problems anticipated after discharge. A:  Medications administered per MD orders.  Emotional support and encouragement given patient. R:  Denied SI and HI, contracts for safety.  Safety maintained with 15 minute checks. Patient will discuss campral with MD, would like to restart campral.

## 2014-05-07 NOTE — BHH Group Notes (Signed)
[facility] LCSW Aftercare Discharge Planning Group Note   05/07/2014 9:51 AM  Participation Quality:  Appropriate   Mood/Affect:  Appropriate  Depression Rating:  4  Anxiety Rating:  9  Thoughts of Suicide:  No Will you contract for safety?   NA  Current AVH:  No  Plan for Discharge/Comments:  Pt reports that he wants to get into [facility] as his first choice/[facility] as plan B. Pt reports mild withdrawals and wants something for anxiety-MD will be notified. Pt reports better sleep than it has been. Pt reports no court dates pending and he has photo ID.   Transportation Means: unknown at this time.   Supports: none identified by pt.   Smart, [employer]

## 2014-05-08 DIAGNOSIS — F10239 Alcohol dependence with withdrawal, unspecified: Secondary | ICD-10-CM | POA: Insufficient documentation

## 2014-05-08 MED ORDER — HYDROXYZINE HCL 25 MG PO TABS: 25.0000 mg | ORAL_TABLET | Freq: Four times a day (QID) | ORAL | Status: DC | PRN

## 2014-05-08 MED ORDER — ACAMPROSATE CALCIUM 333 MG PO TBEC: 666.0000 mg | DELAYED_RELEASE_TABLET | Freq: Three times a day (TID) | ORAL | Status: DC

## 2014-05-08 MED ORDER — NICOTINE 21 MG/24HR TD PT24: 21.0000 mg | MEDICATED_PATCH | Freq: Every day | TRANSDERMAL | Status: DC

## 2014-05-08 MED ORDER — CITALOPRAM HYDROBROMIDE 20 MG PO TABS: 20.0000 mg | ORAL_TABLET | Freq: Every day | ORAL | Status: DC

## 2014-05-08 MED ORDER — GABAPENTIN 300 MG PO CAPS: 300.0000 mg | ORAL_CAPSULE | Freq: Three times a day (TID) | ORAL | Status: DC

## 2014-05-08 MED ORDER — HYDROXYZINE HCL 25 MG PO TABS
25.0000 mg | ORAL_TABLET | Freq: Four times a day (QID) | ORAL | Status: DC | PRN
  Filled 2014-05-08: qty 30

## 2014-05-08 MED ORDER — THIAMINE HCL 100 MG PO TABS: 100.0000 mg | ORAL_TABLET | Freq: Every day | ORAL | Status: DC

## 2014-05-08 MED ORDER — TRAZODONE HCL 100 MG PO TABS: 100.0000 mg | ORAL_TABLET | Freq: Every day | ORAL | Status: DC

## 2014-05-08 MED ORDER — QUETIAPINE FUMARATE 50 MG PO TABS: 50.0000 mg | ORAL_TABLET | Freq: Three times a day (TID) | ORAL | Status: DC
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
## 2014-05-08 NOTE — BHH Suicide Risk Assessment (Signed)
[facility] Discharge Suicide Risk Assessment   Demographic Factors:  Male and Caucasian  Total Time spent with patient: 30 minutes  Musculoskeletal: Strength & Muscle Tone: within normal limits Gait & Station: normal Patient leans: N/A  Psychiatric Specialty Exam: Physical Exam  Review of Systems  Constitutional: Negative.   HENT: Negative.   Eyes: Negative.   Respiratory: Negative.   Cardiovascular: Negative.   Gastrointestinal: Negative.   Genitourinary: Negative.   Musculoskeletal: Negative.   Skin: Negative.   Neurological: Negative.   Endo/Heme/Allergies: Negative.   Psychiatric/Behavioral: Positive for substance abuse. The patient is nervous/anxious and has insomnia.     Blood pressure 119/75, pulse 78, temperature 97.5 F (36.4 C), temperature source Oral, resp. rate 18, height 6\' 2"  (1.88 m), weight 108.41 kg (239 lb), SpO2 99 %.Body mass index is 30.67 kg/(m^2).  General Appearance: Fairly Groomed  Engineer, water::  Fair  Speech:  Clear and [id number]  Volume:  Normal  Mood:  Anxious  Affect:  Appropriate  Thought Process:  Coherent and Goal Directed  Orientation:  Full (Time, Place, and Person)  Thought Content:  plans as he moves on, worries concerns  Suicidal Thoughts:  No  Homicidal Thoughts:  No  Memory:  Immediate;   Fair Recent;   Fair Remote;   Fair  Judgement:  Fair  Insight:  Present  Psychomotor Activity:  Normal  Concentration:  Fair  Recall:  [employer] [place]  Language: Fair  Akathisia:  No  Handed:  Right  AIMS (if indicated):     Assets:  Desire for Improvement  Sleep:  Number of Hours: 6.5  Cognition: WNL  ADL's:  Intact   Have you used any form of tobacco in the last 30 days? (Cigarettes, Smokeless Tobacco, Cigars, and/or Pipes): Yes  Has this patient used any form of tobacco in the last 30 days? (Cigarettes, Smokeless Tobacco, Cigars, and/or Pipes) Yes, A prescription for an FDA-approved tobacco cessation medication was  offered at discharge and the patient refused  Mental Status Per Nursing Assessment::   On Admission:     Current Mental Status by Physician: In full contact with reality. There are no active SI plans or intent. There are no active S/S of withdrawal. He is going to see his probation officer, check with them and then come back to the lobby of [facility] and be picked up by [facility] at 3 PM. He states he is committed to make this plan work for him. Admits he cant continue to do the same thing over and over again. After [facility] might want to pursue a longer term program   Loss Factors: Legal issues  Historical Factors: military  Risk Reduction Factors:   skills, wants to do better  Continued Clinical Symptoms:  Depression:   Comorbid alcohol abuse/dependence Impulsivity Alcohol/Substance Abuse/Dependencies  Cognitive Features That Contribute To Risk:  Closed-mindedness, Polarized thinking and Thought constriction (tunnel vision)    Suicide Risk:  Minimal: No identifiable suicidal ideation.  Patients presenting with no risk factors but with morbid ruminations; may be classified as minimal risk based on the severity of the depressive symptoms  Principal Problem: Alcohol dependence Discharge Diagnoses:  Patient Active Problem List   Diagnosis Date Noted  . Severe major depression without psychotic features [F32.2] 04/24/2014    Priority: High  . Suicidal ideations [R45.851]     Priority: High  . Alcohol dependence with intoxication with complication [M62.947] 65/46/5035    Priority: High  . Social anxiety disorder [F40.10] 01/09/2014  Priority: High  . Panic attacks [F41.0] 01/09/2014    Priority: High  . Alcohol dependence [F10.20] 01/08/2014    Priority: High  . PTSD (post-traumatic stress disorder) [F43.10] 04/25/2014    Priority: Medium  . Major depressive disorder, recurrent, severe without psychotic features [F33.2]   . Alcohol dependence with withdrawal, uncomplicated [I10.301]  31/43/8887  . Tobacco abuse [Z72.0] 04/16/2014  . Alcohol withdrawal [F10.239] 04/16/2014  . Alcohol dependence with uncomplicated withdrawal [N79.728]   . Major depressive disorder, recurrent episode, moderate [F33.1]   . GAD (generalized anxiety disorder) [F41.1] 01/09/2014    Follow-up Information    Follow up with [facility].   Contact information:   [address] [place], [place] [address] Phone: [phone] Fax: [phone]      Follow up with [facility].   Why:  referral sent: 05/04/14   Contact information:   [place] [place], [place] [address] Phone: [phone] Fax: [phone]      [facility]   Why:  Tues at 9:00AM for assessment for outpatient services/med management.    Contact information:   [address] [place], [place] [address] Phone: [phone] Fax: [phone]      Plan Of Care/Follow-up recommendations:  Activity:  as tolerated Diet:  regular Follow up [facility] and then [facility] Is patient on multiple antipsychotic therapies at discharge:  No   Has Patient had three or more failed trials of antipsychotic monotherapy by history:  No  Recommended Plan for Multiple Antipsychotic Therapies: NA    Areatha Kalata A 05/08/2014, 10:24 AM

## 2014-05-08 NOTE — Progress Notes (Signed)
  [facility] Adult Case Management Discharge Plan :  Will you be returning to the same living situation after discharge:  No.Pt accepted to [facility] for today At discharge, do you have transportation home?: Yes,  [facility] to pick up pt at [facility] at 3;15PM Do you have the ability to pay for your medications: Yes,  mental health  Release of information consent forms completed and submitted to Medical Records by CSW.  Patient to Follow up at: Follow-up Information    Follow up with [facility] On 05/08/2014.   Why:  Discharge to [facility] today for continued treatment. Please call office if you need to reschedule.    Contact information:   [address] [place], [place] [address] Phone: [phone] Fax: [phone]      [facility] with [facility] .   Why:  After completing treatment at [facility], please walk in to [facility] for assessment for outpatient services. Walk in time is 9am-12pm every Tuesday. Thank you!    Contact information:   [address] [place], [place] [address] Phone: [phone] Fax: [phone]      Patient denies SI/HI: Yes,  during group/self report.     Safety Planning and Suicide Prevention discussed: Yes,  SPE completed with pt. SPI pamphlet provided to pt and he was encouraged to share information with support network, ask questions, and talk about any concerns relating to SPE. Pt refused to consent to family contact.   Have you used any form of tobacco in the last 30 days? (Cigarettes, Smokeless Tobacco, Cigars, and/or Pipes): Yes  Has patient been referred to the Quitline?: Patient refused referral   Smart, Alicia Amel  05/08/2014, 11:13 AM

## 2014-05-08 NOTE — Progress Notes (Signed)
Patient discharged per physician order; patient denies SI/HI and A/V hallucinations; patient received samples, prescriptions, and copy of AVS after it was reviewed; patient also received 2 bus passes and per RN Seth Bake he will arrive back to be picked up by [facility] representative; patient had no other questions or concerns at this time; patient verbalized and signed that they received all belongings; patient left the unit ambulatory

## 2014-05-08 NOTE — BHH Group Notes (Signed)
Adult Psychoeducational Group Note  Date:  05/08/2014 Time:  0900 am  Group Topic/Focus:  Orientation:   The focus of this group is to educate the patient on the purpose and policies of crisis stabilization and provide a format to answer questions about their admission.  The group details unit policies and expectations of patients while admitted.  Participation Level:  Minimal  Participation Quality:  Appropriate and Attentive  Affect:  Appropriate  Cognitive:  Alert and Appropriate  Insight: Appropriate  Engagement in Group:  Engaged  Modes of Intervention:  Discussion, Education and Orientation  Additional Comments:  Pt attended group this am sat quietly and was attentive.  Wynn Banker 05/08/2014, 9:12 AM

## 2014-05-08 NOTE — Clinical Social Work Note (Signed)
CSW left voicemail for Melissa at [facility] regarding bed availability and transportation. Awaiting return call.  Tilden Fossa, MSW, [place] Worker [facility] [phone]

## 2014-05-25 ENCOUNTER — Ambulatory Visit: Payer: Self-pay

## 2015-05-19 ENCOUNTER — Emergency Department (HOSPITAL_COMMUNITY)
Admission: EM | Admit: 2015-05-19 | Discharge: 2015-05-20 | Disposition: A | Discharge status: Other Acute Inpatient Hospital | Payer: Self-pay | Attending: Emergency Medicine | Admitting: Emergency Medicine

## 2015-05-19 ENCOUNTER — Encounter (HOSPITAL_COMMUNITY): Payer: Self-pay | Admitting: *Deleted

## 2015-05-19 DIAGNOSIS — F1010 Alcohol abuse, uncomplicated: Secondary | ICD-10-CM | POA: Insufficient documentation

## 2015-05-19 DIAGNOSIS — F4310 Post-traumatic stress disorder, unspecified: Secondary | ICD-10-CM | POA: Insufficient documentation

## 2015-05-19 DIAGNOSIS — F329 Major depressive disorder, single episode, unspecified: Secondary | ICD-10-CM | POA: Insufficient documentation

## 2015-05-19 DIAGNOSIS — F419 Anxiety disorder, unspecified: Secondary | ICD-10-CM | POA: Insufficient documentation

## 2015-05-19 DIAGNOSIS — Z79899 Other long term (current) drug therapy: Secondary | ICD-10-CM | POA: Insufficient documentation

## 2015-05-19 DIAGNOSIS — R45851 Suicidal ideations: Secondary | ICD-10-CM | POA: Insufficient documentation

## 2015-05-19 DIAGNOSIS — R600 Localized edema: Secondary | ICD-10-CM | POA: Insufficient documentation

## 2015-05-19 DIAGNOSIS — F17210 Nicotine dependence, cigarettes, uncomplicated: Secondary | ICD-10-CM | POA: Insufficient documentation

## 2015-05-19 LAB — COMPREHENSIVE METABOLIC PANEL
ALBUMIN: 3.9 g/dL (ref 3.5–5.0)
ALT: 21 U/L (ref 17–63)
AST: 20 U/L (ref 15–41)
Alkaline Phosphatase: 40 U/L (ref 38–126)
Anion gap: 12 (ref 5–15)
BUN: 7 mg/dL (ref 6–20)
CO2: 25 mmol/L (ref 22–32)
Calcium: 8.8 mg/dL — ABNORMAL LOW (ref 8.9–10.3)
Chloride: 105 mmol/L (ref 101–111)
Creatinine, Ser: 0.81 mg/dL (ref 0.61–1.24)
GFR calc Af Amer: >60 mL/min (ref >60)
GFR calc non Af Amer: >60 mL/min (ref >60)
Glucose, Bld: 97 mg/dL (ref 65–99)
Potassium: 3.7 mmol/L (ref 3.5–5.1)
Sodium: 142 mmol/L (ref 135–145)
TOTAL PROTEIN: 6.4 g/dL — AB (ref 6.5–8.1)
Total Bilirubin: 0.3 mg/dL (ref 0.3–1.2)

## 2015-05-19 LAB — CBC
HCT: 41.9 % (ref 39.0–52.0)
Hemoglobin: 14.4 g/dL (ref 13.0–17.0)
MCH: 34.4 pg — ABNORMAL HIGH (ref 26.0–34.0)
MCHC: 34.4 g/dL (ref 30.0–36.0)
MCV: 100.2 fL — AB (ref 78.0–100.0)
Platelets: 365 10*3/uL (ref 150–400)
RBC: 4.18 MIL/uL — ABNORMAL LOW (ref 4.22–5.81)
RDW: 15.5 % (ref 11.5–15.5)
WBC: 10.5 10*3/uL (ref 4.0–10.5)

## 2015-05-19 LAB — SALICYLATE LEVEL: Salicylate Lvl: <4 mg/dL (ref 2.8–30.0)

## 2015-05-19 LAB — RAPID URINE DRUG SCREEN, HOSP PERFORMED
Amphetamines: NOT DETECTED
BARBITURATES: NOT DETECTED
Benzodiazepines: NOT DETECTED
Cocaine: NOT DETECTED
Opiates: NOT DETECTED
Tetrahydrocannabinol: NOT DETECTED

## 2015-05-19 LAB — ACETAMINOPHEN LEVEL: Acetaminophen (Tylenol), Serum: <10 ug/mL — ABNORMAL LOW (ref 10–30)

## 2015-05-19 LAB — ETHANOL: Alcohol, Ethyl (B): 124 mg/dL — ABNORMAL HIGH (ref <5)

## 2015-05-19 MED ORDER — ACETAMINOPHEN 325 MG PO TABS: 650.0000 mg | ORAL_TABLET | ORAL | Status: DC | PRN

## 2015-05-19 MED ORDER — ONDANSETRON HCL 4 MG PO TABS
4.0000 mg | ORAL_TABLET | Freq: Three times a day (TID) | ORAL | Status: DC | PRN
  Administered 2015-05-19 – 2015-05-20 (×2): 4 mg via ORAL
  Filled 2015-05-19 (×2): qty 1

## 2015-05-19 MED ORDER — LORAZEPAM 1 MG PO TABS
1.0000 mg | ORAL_TABLET | Freq: Once | ORAL | Status: AC
  Administered 2015-05-19: 1 mg via ORAL
  Filled 2015-05-19: qty 1

## 2015-05-19 MED ORDER — LORAZEPAM 1 MG PO TABS: 0.0000 mg | ORAL_TABLET | Freq: Two times a day (BID) | ORAL | Status: DC

## 2015-05-19 MED ORDER — ZOLPIDEM TARTRATE 5 MG PO TABS: 5.0000 mg | ORAL_TABLET | Freq: Every evening | ORAL | Status: DC | PRN

## 2015-05-19 MED ORDER — THIAMINE HCL 100 MG/ML IJ SOLN: 100.0000 mg | Freq: Every day | INTRAMUSCULAR | Status: DC

## 2015-05-19 MED ORDER — LORAZEPAM 1 MG PO TABS
1.0000 mg | ORAL_TABLET | Freq: Three times a day (TID) | ORAL | Status: DC | PRN
  Administered 2015-05-19 – 2015-05-20 (×2): 1 mg via ORAL
  Filled 2015-05-19 (×2): qty 1

## 2015-05-19 MED ORDER — IBUPROFEN 200 MG PO TABS: 600.0000 mg | ORAL_TABLET | Freq: Three times a day (TID) | ORAL | Status: DC | PRN

## 2015-05-19 MED ORDER — ALUM & MAG HYDROXIDE-SIMETH 200-200-20 MG/5ML PO SUSP: 30.0000 mL | ORAL | Status: DC | PRN

## 2015-05-19 MED ORDER — NICOTINE 21 MG/24HR TD PT24
21.0000 mg | MEDICATED_PATCH | Freq: Once | TRANSDERMAL | Status: AC
  Administered 2015-05-19: 21 mg via TRANSDERMAL
  Filled 2015-05-19 (×2): qty 1

## 2015-05-19 MED ORDER — LORAZEPAM 1 MG PO TABS
0.0000 mg | ORAL_TABLET | Freq: Four times a day (QID) | ORAL | Status: DC
  Administered 2015-05-19 – 2015-05-20 (×3): 2 mg via ORAL
  Filled 2015-05-19 (×4): qty 2

## 2015-05-19 MED ORDER — VITAMIN B-1 100 MG PO TABS
100.0000 mg | ORAL_TABLET | Freq: Every day | ORAL | Status: DC
  Administered 2015-05-19 – 2015-05-20 (×2): 100 mg via ORAL
  Filled 2015-05-19 (×2): qty 1

## 2015-05-19 NOTE — ED Notes (Signed)
Sitter arrived to bedside. Verified w/Brittany, Staffing Office, that sitter may be moved from another room to this room.

## 2015-05-19 NOTE — ED Notes (Signed)
Ordered the patient a breakfast Tray.
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
## 2015-05-19 NOTE — ED Notes (Signed)
Pharmacy Tech in w/pt. 
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
## 2015-05-19 NOTE — ED Notes (Signed)
Pt voiced understanding of Pod C policies.

## 2015-05-19 NOTE — ED Notes (Signed)
Chad Morgan, AD, Charge RN, aware no sitter w/pt. Pt's room is across from nurses' station so pt is being observed by RN.

## 2015-05-19 NOTE — ED Notes (Signed)
Ativan 1mg  given d/t slight tremors remain.

## 2015-05-19 NOTE — ED Notes (Signed)
Shower supplies placed on counter in room.

## 2015-05-19 NOTE — ED Notes (Signed)
Pt states unable to eat lunch on bedside table.
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
## 2015-05-19 NOTE — BH Assessment (Signed)
[facility] Assessment Progress Note      Faxed referrals to:  [facility]

## 2015-05-19 NOTE — ED Notes (Signed)
The pt reports that he is suicidal and he is an alcoholic

## 2015-05-19 NOTE — ED Notes (Signed)
TTS in process 

## 2015-05-20 ENCOUNTER — Encounter (HOSPITAL_COMMUNITY): Payer: Self-pay | Admitting: *Deleted

## 2015-05-20 ENCOUNTER — Inpatient Hospital Stay (HOSPITAL_COMMUNITY)
Admission: AD | Admit: 2015-05-20 | Discharge: 2015-05-27 | DRG: 885 | Disposition: A | Discharge status: Home / Self Care | Payer: No Typology Code available for payment source | Source: Intra-hospital | Attending: Psychiatry | Admitting: Psychiatry

## 2015-05-20 DIAGNOSIS — F17210 Nicotine dependence, cigarettes, uncomplicated: Secondary | ICD-10-CM | POA: Diagnosis present

## 2015-05-20 DIAGNOSIS — R45851 Suicidal ideations: Secondary | ICD-10-CM | POA: Diagnosis present

## 2015-05-20 DIAGNOSIS — F1020 Alcohol dependence, uncomplicated: Secondary | ICD-10-CM | POA: Diagnosis present

## 2015-05-20 DIAGNOSIS — F332 Major depressive disorder, recurrent severe without psychotic features: Principal | ICD-10-CM

## 2015-05-20 MED ORDER — MAGNESIUM HYDROXIDE 400 MG/5ML PO SUSP: 30.0000 mL | Freq: Every day | ORAL | Status: DC | PRN

## 2015-05-20 MED ORDER — LORAZEPAM 1 MG PO TABS
1.0000 mg | ORAL_TABLET | Freq: Four times a day (QID) | ORAL | Status: AC | PRN
  Administered 2015-05-21 – 2015-05-22 (×4): 1 mg via ORAL
  Filled 2015-05-20 (×4): qty 1

## 2015-05-20 MED ORDER — ACETAMINOPHEN 325 MG PO TABS
650.0000 mg | ORAL_TABLET | Freq: Four times a day (QID) | ORAL | Status: DC | PRN
  Administered 2015-05-21 – 2015-05-25 (×5): 650 mg via ORAL
  Filled 2015-05-20 (×5): qty 2

## 2015-05-20 MED ORDER — ENSURE ENLIVE PO LIQD
237.0000 mL | ORAL | Status: DC
  Administered 2015-05-20 – 2015-05-26 (×6): 237 mL via ORAL

## 2015-05-20 MED ORDER — LORAZEPAM 1 MG PO TABS
1.0000 mg | ORAL_TABLET | Freq: Four times a day (QID) | ORAL | Status: AC
  Administered 2015-05-20 – 2015-05-21 (×4): 1 mg via ORAL
  Filled 2015-05-20 (×4): qty 1

## 2015-05-20 MED ORDER — ONDANSETRON 4 MG PO TBDP
4.0000 mg | ORAL_TABLET | Freq: Four times a day (QID) | ORAL | Status: AC | PRN
  Administered 2015-05-20 – 2015-05-21 (×3): 4 mg via ORAL
  Filled 2015-05-20 (×3): qty 1

## 2015-05-20 MED ORDER — NICOTINE 21 MG/24HR TD PT24
21.0000 mg | MEDICATED_PATCH | Freq: Every day | TRANSDERMAL | Status: DC
  Administered 2015-05-20: 21 mg via TRANSDERMAL

## 2015-05-20 MED ORDER — LORAZEPAM 1 MG PO TABS
2.0000 mg | ORAL_TABLET | Freq: Once | ORAL | Status: AC
  Administered 2015-05-20: 2 mg via ORAL

## 2015-05-20 MED ORDER — ADULT MULTIVITAMIN W/MINERALS CH
1.0000 | ORAL_TABLET | Freq: Every day | ORAL | Status: DC
  Administered 2015-05-20 – 2015-05-26 (×7): 1 via ORAL
  Filled 2015-05-20 (×10): qty 1

## 2015-05-20 MED ORDER — LORAZEPAM 1 MG PO TABS
1.0000 mg | ORAL_TABLET | Freq: Three times a day (TID) | ORAL | Status: AC
  Administered 2015-05-21 – 2015-05-22 (×3): 1 mg via ORAL
  Filled 2015-05-20 (×3): qty 1

## 2015-05-20 MED ORDER — HYDROXYZINE HCL 25 MG PO TABS
25.0000 mg | ORAL_TABLET | Freq: Four times a day (QID) | ORAL | Status: AC | PRN
  Administered 2015-05-20 – 2015-05-23 (×5): 25 mg via ORAL
  Filled 2015-05-20 (×5): qty 1

## 2015-05-20 MED ORDER — LORAZEPAM 1 MG PO TABS
1.0000 mg | ORAL_TABLET | Freq: Two times a day (BID) | ORAL | Status: AC
  Administered 2015-05-22 – 2015-05-23 (×2): 1 mg via ORAL
  Filled 2015-05-20 (×2): qty 1

## 2015-05-20 MED ORDER — VITAMIN B-1 100 MG PO TABS
100.0000 mg | ORAL_TABLET | Freq: Every day | ORAL | Status: DC
  Administered 2015-05-21 – 2015-05-26 (×6): 100 mg via ORAL
  Filled 2015-05-20 (×8): qty 1

## 2015-05-20 MED ORDER — LOPERAMIDE HCL 2 MG PO CAPS: 2.0000 mg | ORAL_CAPSULE | ORAL | Status: AC | PRN

## 2015-05-20 MED ORDER — LORAZEPAM 1 MG PO TABS
1.0000 mg | ORAL_TABLET | Freq: Every day | ORAL | Status: AC
  Administered 2015-05-24: 1 mg via ORAL
  Filled 2015-05-20: qty 1

## 2015-05-20 MED ORDER — PNEUMOCOCCAL VAC POLYVALENT 25 MCG/0.5ML IJ INJ
0.5000 mL | INJECTION | INTRAMUSCULAR | Status: AC
  Administered 2015-05-22: 0.5 mL via INTRAMUSCULAR

## 2015-05-20 MED ORDER — TRAZODONE HCL 100 MG PO TABS
100.0000 mg | ORAL_TABLET | Freq: Every evening | ORAL | Status: DC | PRN
  Administered 2015-05-20 – 2015-05-22 (×3): 100 mg via ORAL
  Filled 2015-05-20 (×3): qty 1

## 2015-05-20 MED ORDER — ALUM & MAG HYDROXIDE-SIMETH 200-200-20 MG/5ML PO SUSP: 30.0000 mL | ORAL | Status: DC | PRN
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
## 2015-05-20 NOTE — ED Notes (Signed)
Pt oob to br with steady gait. Sitter remains with pt.

## 2015-05-20 NOTE — Progress Notes (Signed)
Entered in error

## 2015-05-20 NOTE — Progress Notes (Signed)
NUTRITION ASSESSMENT  Chad Morgan identified as at risk on the Malnutrition Screen Tool  INTERVENTION: 1. Educated patient on the importance of nutrition and encouraged intake of food and beverages. 2. Discussed weight goals. 3. Supplements: Will order Ensure Enlive once/day, this supplement provides 350 kcal and 20 grams of protein.   NUTRITION DIAGNOSIS: Unintentional weight loss related to sub-optimal intake as evidenced by Chad Morgan report.   Goal: Chad Morgan to meet >/= 90% of their estimated nutrition needs.  Monitor:  PO intake  Assessment:  Chad Morgan screened for MST. Chad Morgan admitted for SI (with attempt) with increasing anxiety and depression and hx of alcohol abuse. Chad Morgan reports that related to this he has had a decreased appetite for 3 weeks with associated weight loss.   Per chart review, Chad Morgan has lost 25 lbs (10% body weight) in the past 1 month which is significant for time frame. Prior to this, Chad Morgan had gained 20 lbs in 3 months; current weight is comparable to weight in January 2017. Will order Ensure Enlive once/day to supplement protein.     35 y.o. male  Height: Ht Readings from Last 1 Encounters:  05/20/15 6\' 1"  (1.854 m)    Weight: Wt Readings from Last 1 Encounters:  05/20/15 215 lb (97.523 kg)    Weight Hx: Wt Readings from Last 10 Encounters:  05/20/15 215 lb (97.523 kg)  05/19/15 220 lb (99.791 kg)  05/04/14 239 lb (108.41 kg)  04/23/14 240 lb (108.863 kg)  01/20/14 220 lb (99.791 kg)  01/08/14 222 lb (100.699 kg)  01/06/14 220 lb (99.791 kg)    BMI:  Body mass index is 28.37 kg/(m^2). Chad Morgan meets criteria for overweight based on current BMI.  Estimated Nutritional Needs: Kcal: 25-30 kcal/kg Protein: > 1 gram protein/kg Fluid: 1 ml/kcal  Diet Order: Diet Heart Room service appropriate?: Yes; Fluid consistency:: Thin Chad Morgan is also offered choice of unit snacks mid-morning and mid-afternoon.  Chad Morgan is eating as desired.   Lab results and medications reviewed.      Jarome Matin, RD, LDN Inpatient Clinical Dietitian Pager # [phone] After hours/weekend pager # [phone]

## 2015-05-20 NOTE — Tx Team (Signed)
Initial Interdisciplinary Treatment Plan   PATIENT STRESSORS: Financial difficulties Marital or family conflict Occupational concerns Substance abuse   PATIENT STRENGTHS: Ability for insight Agricultural engineer for treatment/growth Work skills   PROBLEM LIST: Problem List/Patient Goals Date to be addressed Date deferred Reason deferred Estimated date of resolution  At risk for suicide 05/20/2015  05/20/2015   D/C  Depression 05/20/2015  05/20/2015   D/C  Substance Abuse 05/20/2015  05/20/2015   D/C  "Medication to stabilize" 05/20/2015  05/20/2015   D/C  "14 day rehab facility and then to a sober living house" 05/20/2015  05/20/2015   D/C                           DISCHARGE CRITERIA:  Adequate post-discharge living arrangements Improved stabilization in mood, thinking, and/or behavior Motivation to continue treatment in a less acute level of care Need for constant or close observation no longer present Verbal commitment to aftercare and medication compliance Withdrawal symptoms are absent or subacute and managed without 24-hour nursing intervention  PRELIMINARY DISCHARGE PLAN: Attend 12-step recovery group Outpatient therapy Placement in alternative living arrangements  PATIENT/FAMIILY INVOLVEMENT: This treatment plan has been presented to and reviewed with the patient, Chad Morgan.  The patient and family have been given the opportunity to ask questions and make suggestions.  Donne Hazel P 05/20/2015, 4:12 PM

## 2015-05-20 NOTE — ED Provider Notes (Signed)
PT accepted to [facility] by Dr. Charna Elizabeth, MD 05/20/15 1101

## 2015-05-20 NOTE — Progress Notes (Signed)
Pt has been observed throughout the evening in the dayroom talking with staff.  He denies SI/HI/AVH.  He reports he is having significant withdrawal symptoms this evening.  He does appear shaky and sweaty.  He attended evening group tonight.  He has been pleasant and cooperative.  Pt was medicated per withdrawal protocol, and given a prn sleep aid.  He makes his needs known to staff.  Support and encouragement offered.  Pt is interested in a rehab program after discharge.  Discharge plans are in process.  Safety maintained with q15 minute checks.

## 2015-05-21 DIAGNOSIS — F1020 Alcohol dependence, uncomplicated: Secondary | ICD-10-CM

## 2015-05-21 DIAGNOSIS — F332 Major depressive disorder, recurrent severe without psychotic features: Principal | ICD-10-CM

## 2015-05-21 MED ORDER — NICOTINE 21 MG/24HR TD PT24
21.0000 mg | MEDICATED_PATCH | Freq: Every day | TRANSDERMAL | Status: DC
  Administered 2015-05-21 – 2015-05-26 (×6): 21 mg via TRANSDERMAL
  Filled 2015-05-21 (×8): qty 1

## 2015-05-21 MED ORDER — SERTRALINE HCL 25 MG PO TABS
25.0000 mg | ORAL_TABLET | Freq: Every day | ORAL | Status: DC
  Administered 2015-05-21 – 2015-05-26 (×6): 25 mg via ORAL
  Filled 2015-05-21 (×9): qty 1

## 2015-05-21 MED ORDER — GABAPENTIN 100 MG PO CAPS
100.0000 mg | ORAL_CAPSULE | Freq: Three times a day (TID) | ORAL | Status: DC
  Administered 2015-05-21 – 2015-05-22 (×4): 100 mg via ORAL
  Filled 2015-05-21 (×8): qty 1

## 2015-05-21 NOTE — Plan of Care (Signed)
Problem: Education: Goal: Knowledge of disease or condition will improve Outcome: Progressing Nurse discussed depression/substance abuse/coping skills with patient.

## 2015-05-21 NOTE — BHH Group Notes (Signed)
[place] LCSW Group Therapy 05/21/2015  1:15 PM   Type of Therapy: Group Therapy  Participation Level: Did Not Attend. Patient invited to participate but declined.   Tilden Fossa, MSW, [place] Clinical Social Worker [facility] [phone]

## 2015-05-21 NOTE — Progress Notes (Signed)
The patient attended last evening's A.A. Meeting and was appropriate.

## 2015-05-21 NOTE — Progress Notes (Signed)
Recreation Therapy Notes  Animal-Assisted Activity (AAA) Program Checklist/Progress Notes Patient Eligibility Criteria Checklist & Daily Group note for Rec Tx Intervention  Date: 05.15.2017 Time: 2:45pm Location: [address]    AAA/T Program Assumption of Risk Form signed by Patient/ or Parent Legal Guardian Yes  Patient is free of allergies or sever asthma Yes  Patient reports no fear of animals Yes  Patient reports no history of cruelty to animals Yes  Patient understands his/her participation is voluntary Yes  Behavioral Response: Did not attend.    Laureen Ochs Steffi Noviello, LRT/CTRS        Mennie Spiller L 05/21/2015 2:57 PM

## 2015-05-21 NOTE — Progress Notes (Signed)
D:  Patient denied SI this morning, contracts for safety.  Denied HI.  Denied A/V hallucinations, then stated he does see spots occasionally.   A:  Medications administered per MD orders.  Emotional support and encouragement given patient. R:  Safety maintained with 15 minute checks.

## 2015-05-21 NOTE — BHH Suicide Risk Assessment (Signed)
[facility] Admission Suicide Risk Assessment   Nursing information obtained from:  Patient Demographic factors:  Male, Divorced or widowed, Caucasian, Low socioeconomic status, Unemployed, Access to firearms Current Mental Status:  Suicidal ideation indicated by patient, Suicide plan, Plan includes specific time, place, or method, Self-harm thoughts, Self-harm behaviors, Intention to act on suicide plan, Belief that plan would result in death Loss Factors:  Decrease in vocational status, Loss of significant relationship, Decline in physical health, Financial problems / change in socioeconomic status Historical Factors:  NA Risk Reduction Factors:  Responsible for children under 68 years of age, Sense of responsibility to family, Living with another person, especially a relative  Total Time spent with patient: 45 minutes Principal Problem: <principal problem not specified> Diagnosis:   Patient Active Problem List   Diagnosis Date Noted  . Alcohol use disorder, severe, dependence ([place]) [F10.20] 05/20/2015  . Alcohol dependence with withdrawal with complication ([place]) 123456   . Major depressive disorder, recurrent, severe without psychotic features ([place]) [F33.2]   . PTSD (post-traumatic stress disorder) [F43.10] 04/25/2014  . Severe major depression without psychotic features ([place]) [F32.2] 04/24/2014  . Suicidal ideations [R45.851]   . Alcohol dependence with withdrawal, uncomplicated ([place]) [id number] 04/23/2014  . Alcohol dependence with intoxication with complication ([place]) 0000000 04/23/2014  . Tobacco abuse [Z72.0] 04/16/2014  . Alcohol withdrawal ([place]) [F10.239] 04/16/2014  . Alcohol dependence with uncomplicated withdrawal ([place]) [F10.230]   . Major depressive disorder, recurrent episode, moderate ([name]) [F33.1]   . Social anxiety disorder [F40.10] 01/09/2014  . Panic attacks [F41.0] 01/09/2014  . GAD (generalized anxiety disorder) [F41.1] 01/09/2014  . Alcohol dependence ([place]) [F10.20]  01/08/2014   Subjective Data: Patient is a 35 year old Caucasian male with major depressive disorder and severe alcohol abuse and dependence presenting with suicidal thoughts and  an attempt to shoot himself with a gun  Continued Clinical Symptoms:  Alcohol Use Disorder Identification Test Final Score (AUDIT): 38 The "Alcohol Use Disorders Identification Test", Guidelines for Use in Primary Care, Second Edition.  World Pharmacologist [facility]). Score between 0-7:  no or low risk or alcohol related problems. Score between 8-15:  moderate risk of alcohol related problems. Score between 16-19:  high risk of alcohol related problems. Score 20 or above:  warrants further diagnostic evaluation for alcohol dependence and treatment.   CLINICAL FACTORS:   Depression:   Anhedonia Comorbid alcohol abuse/dependence Hopelessness Impulsivity Insomnia   Musculoskeletal: Strength & Muscle Tone: within normal limits Gait & Station: normal Patient leans: N/A  Psychiatric Specialty Exam: ROS  Blood pressure 98/55, pulse 86, temperature 98 F (36.7 C), temperature source Oral, resp. rate 16, height 6\' 1"  (1.854 m), weight 215 lb (97.523 kg).Body mass index is 28.37 kg/(m^2).   General Appearance: Casual  Eye Contact::  Fair  Speech:  Slow  Volume:  Decreased  Mood:  Anxious and Depressed  Affect:  Constricted and Depressed  Thought Process:  Coherent  Orientation:  Full (Time, Place, and Person)  Thought Content:  WDL  Suicidal Thoughts:  Yes.  with intent/plan  Homicidal Thoughts:  No  Memory:  Immediate;   Fair Recent;   Fair Remote;   Fair  Judgement:  Impaired  Insight:  Shallow  Psychomotor Activity:  Normal  Concentration:  Fair  Recall:  [place]  Language: Fair  Akathisia:  No  Handed:  Right  AIMS (if indicated):     Assets:  Communication Skills Desire for Improvement  ADL's:  Intact  Cognition: WNL  Sleep:  Number of Hours: 5.5     COGNITIVE  FEATURES THAT CONTRIBUTE TO RISK:  Thought constriction (tunnel vision)    SUICIDE RISK:   Moderate:  Frequent suicidal ideation with limited intensity, and duration, some specificity in terms of plans, no associated intent, good self-control, limited dysphoria/symptomatology, some risk factors present, and identifiable protective factors, including available and accessible social support.  PLAN OF CARE:  Treatment Plan Summary: Daily contact with patient to assess and evaluate symptoms and progress in treatment and Medication management  Observation Level/Precautions:  15 minute checks  Laboratory:  CBC Chemistry Profile HbAIC  Psychotherapy:  Patient to engage in groups and develop coping skills to deal with his current stressors. Patient to look into substance abuse treatment options and attend meetings on the unit.   Medications:  Continue with the alcohol detox protocol. Start gabapentin at 100 mg 3 times daily. Start Zoloft at 25 mg once daily.   Consultations:  As needed   Discharge Concerns:  Safety and stabilization   Estimated [id number]    I certify that inpatient services furnished can reasonably be expected to improve the patient's condition.   Elvin So, MD 05/21/2015, 1:57 PM

## 2015-05-21 NOTE — Progress Notes (Signed)
Patient ID: Chad Morgan, male   DOB: 1980/01/11, 35 y.o.   MRN: [id number] D: Patient in room sleeping on approach. Pt mood and affect appeared depressed and flat. Pt detoxing c/o multiple withdrawal symptoms. Pt reports feeling tired of detoxing and want to get clean. Pt denies SI/HI/AVH. No acute physical distress noted.  A: Medications administered as prescribed. Emotional support given and will continue to monitor pt's progress for stabilization. R: Patient remains safe and complaint with medications.
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
## 2015-05-21 NOTE — Tx Team (Signed)
Interdisciplinary Treatment Plan Update (Adult) Date: 05/21/2015    Time Reviewed: 9:30 AM  Progress in Treatment: Attending groups: Continuing to assess, patient new to milieu Participating in groups: Continuing to assess, patient new to milieu Taking medication as prescribed: Yes Tolerating medication: Yes Family/Significant other contact made: No, CSW assessing for appropriate contacts Patient understands diagnosis: Yes Discussing patient identified problems/goals with staff: Yes Medical problems stabilized or resolved: Yes Denies suicidal/homicidal ideation: Yes Issues/concerns per patient self-inventory: Yes Other:  New problem(s) identified: N/A  Discharge Plan or Barriers: CSW continuing to assess, patient new to milieu.  Reason for Continuation of Hospitalization:  Depression Anxiety Medication Stabilization   Comments: N/A  Estimated length of stay: 3-5 days    Patient is a 35 year old male who presented to the hospital with SI and ETOH abuse. Pt reports primary trigger(s) for admission was divorce and medication noncompliance. Patient will benefit from crisis stabilization, medication evaluation, group therapy and psycho education in addition to case management for discharge planning. At discharge, it is recommended that Pt remain compliant with established discharge plan and continued treatment.   Review of initial/current patient goals per problem list:  1. Goal(s): Patient will participate in aftercare plan   Met: No   Target date: 3-5 days post admission date   As evidenced by: Patient will participate within aftercare plan AEB aftercare provider and housing plan at discharge being identified.  5/16: Goal not met: CSW assessing for appropriate referrals for pt and will have follow up secured prior to d/c.    2. Goal (s): Patient will exhibit decreased depressive symptoms and suicidal ideations.   Met: No   Target date: 3-5 days post admission  date   As evidenced by: Patient will utilize self rating of depression at 3 or below and demonstrate decreased signs of depression or be deemed stable for discharge by MD.  5/16: Goal not met: Pt presents with flat affect and depressed mood.  Pt admitted with depression rating of 10.  Pt to show decreased sign of depression and a rating of 3 or less before d/c.      3. Goal(s): Patient will demonstrate decreased signs and symptoms of anxiety.   Met: No   Target date: 3-5 days post admission date   As evidenced by: Patient will utilize self rating of anxiety at 3 or below and demonstrated decreased signs of anxiety, or be deemed stable for discharge by MD  5/16: Goal not met: Pt presents with anxious mood and affect.  Pt admitted with anxiety rating of 10.  Pt to show decreased sign of anxiety and a rating of 3 or less before d/c.    4. Goal(s): Patient will demonstrate decreased signs of withdrawal due to substance abuse   Met: Yes   Target date: 3-5 days post admission date   As evidenced by: Patient will produce a CIWA/COWS score of 0, have stable vitals signs, and no symptoms of withdrawal  5/16: Goal met. No withdrawal symptoms reported at this time per medical chart.      Attendees: Patient:    Family:    Physician: Dr. Parke Poisson; Dr. Einar Grad  05/21/2015 9:30 AM  Nursing: Grayland Ormond, RN 05/21/2015 9:30 AM  Clinical Social Worker: Erasmo Downer Kaeya Schiffer, LCSW 05/21/2015 9:30 AM  Other: Peri Maris, LCSWA; [place], LCSW  05/21/2015 9:30 AM  Other: Norberto Sorenson, P4CC 05/21/2015 9:30 AM  Other: Lars Pinks, Case Manager 05/21/2015 9:30 AM  Other: Agustina Caroli, May Augustin, NP 05/21/2015  9:30 AM  Other:      Scribe for Treatment Team:  Tilden Fossa, [place]

## 2015-05-21 NOTE — BHH Group Notes (Signed)
The focus of this group is to educate the patient on the purpose and policies of crisis stabilization and provide a format to answer questions about their admission.  The group details unit policies and expectations of patients while admitted.  Patient did not attend 0900 nurse education orientation group this morning.  Patient stayed in bed.   

## 2015-05-22 MED ORDER — GABAPENTIN 100 MG PO CAPS
200.0000 mg | ORAL_CAPSULE | Freq: Three times a day (TID) | ORAL | Status: DC
  Administered 2015-05-23 (×2): 200 mg via ORAL
  Filled 2015-05-22 (×4): qty 2

## 2015-05-22 MED ORDER — ACAMPROSATE CALCIUM 333 MG PO TBEC
666.0000 mg | DELAYED_RELEASE_TABLET | Freq: Three times a day (TID) | ORAL | Status: DC
  Administered 2015-05-23 – 2015-05-27 (×13): 666 mg via ORAL
  Filled 2015-05-22 (×18): qty 2

## 2015-05-22 NOTE — BHH Group Notes (Signed)
   [facility] LCSW Aftercare Discharge Planning Group Note  05/22/2015  8:45 AM   Participation Quality: Alert, Appropriate and Oriented  Mood/Affect: Depressed and Flat  Depression Rating: 6  Anxiety Rating: 9  Thoughts of Suicide: Pt denies SI/HI  Will you contract for safety? Yes  Current AVH: Pt denies  Plan for Discharge/Comments: Pt attended discharge planning group and actively participated in group. CSW provided pt with today's workbook. Patient hopes to go to [facility].   Transportation Means: Pt reports access to transportation  Supports: No supports mentioned at this time  Tilden Fossa, MSW, [employer] Clinical Social Worker [employer] [phone]

## 2015-05-22 NOTE — Progress Notes (Signed)
[facility] MD Progress Note  05/22/2015 11:22 AM Chad Morgan  MRN:  [id number] Subjective:  Patient was seen today. He reports that he was unable to sleep last night. States that the trazodone does not help him. Patient very insistent on increasing his gabapentin to 600 mg twice daily. States his withdrawal symptoms a little better but he continues to feel shaky. States is interested in starting acamprosate. Denies any suicidal thoughts today. Presents with a flat affect and depressed mood.  Principal Problem: <principal problem not specified> Diagnosis:   Patient Active Problem List   Diagnosis Date Noted  . Severe episode of recurrent major depressive disorder, without psychotic features ([place]) [F33.2]   . Alcohol use disorder, severe, dependence ([place]) [F10.20] 05/20/2015  . Alcohol dependence with withdrawal with complication ([place]) 123456   . Major depressive disorder, recurrent, severe without psychotic features ([place]) [F33.2]   . PTSD (post-traumatic stress disorder) [F43.10] 04/25/2014  . Severe major depression without psychotic features ([place]) [F32.2] 04/24/2014  . Suicidal ideations [R45.851]   . Alcohol dependence with withdrawal, uncomplicated ([place]) [id number] 04/23/2014  . Alcohol dependence with intoxication with complication ([place]) 0000000 04/23/2014  . Tobacco abuse [Z72.0] 04/16/2014  . Alcohol withdrawal ([place]) [F10.239] 04/16/2014  . Alcohol dependence with uncomplicated withdrawal ([place]) [F10.230]   . Major depressive disorder, recurrent episode, moderate ([name]) [F33.1]   . Social anxiety disorder [F40.10] 01/09/2014  . Panic attacks [F41.0] 01/09/2014  . GAD (generalized anxiety disorder) [F41.1] 01/09/2014  . Alcohol dependence ([place]) [F10.20] 01/08/2014   Total Time spent with patient: 15 minutes  Past Psychiatric History: Multiple hospitalizations at this hospital  Past Medical History:  Past Medical History  Diagnosis Date  . Anxiety   . Depression   . Alcohol  abuse   . PTSD (post-traumatic stress disorder)   . Seizures ([place])     with withdrawal    Past Surgical History  Procedure Laterality Date  . Wisdom tooth extraction     Family History:  Family History  Problem Relation Age of Onset  . Alcoholism Father    Family Psychiatric  History: As above Social History:  History  Alcohol Use  . Yes    Comment:  1/2 gallon of liquor daily     History  Drug Use  . Yes  . Special: Benzodiazepines, Marijuana    Comment: Patient denies     Social History   Social History  . Marital Status: Legally Separated    Spouse Name: N/A  . Number of Children: N/A  . Years of Education: N/A   Social History Main Topics  . Smoking status: Current Every Day Smoker -- 1.00 packs/day for 16 years    Types: Cigarettes  . Smokeless tobacco: Never Used  . Alcohol Use: Yes     Comment:  1/2 gallon of liquor daily  . Drug Use: Yes    Special: Benzodiazepines, Marijuana     Comment: Patient denies   . Sexual Activity: Yes   Other Topics Concern  . None   Social History Narrative   Additional Social History:                         Sleep: Poor  Appetite:  Fair  Current Medications: Current Facility-Administered Medications  Medication Dose Route Frequency Provider Last Rate Last Dose  . acetaminophen (TYLENOL) tablet 650 mg  650 mg Oral Q6H PRN Niel Hummer, NP   650 mg at 05/22/15 0826  . alum &  mag hydroxide-simeth (MAALOX/MYLANTA) 200-200-20 MG/5ML suspension 30 mL  30 mL Oral Q4H PRN Niel Hummer, NP      . feeding supplement (ENSURE ENLIVE) (ENSURE ENLIVE) liquid 237 mL  237 mL Oral Q24H Nicholaus Bloom, MD   237 mL at 05/21/15 1445  . gabapentin (NEURONTIN) capsule 100 mg  100 mg Oral TID Tasmia Blumer, MD   100 mg at 05/22/15 0820  . hydrOXYzine (ATARAX/VISTARIL) tablet 25 mg  25 mg Oral Q6H PRN Niel Hummer, NP   25 mg at 05/21/15 1441  . loperamide (IMODIUM) capsule 2-4 mg  2-4 mg Oral PRN Niel Hummer, NP      .  LORazepam (ATIVAN) tablet 1 mg  1 mg Oral Q6H PRN Niel Hummer, NP   1 mg at 05/21/15 2140  . LORazepam (ATIVAN) tablet 1 mg  1 mg Oral BID Niel Hummer, NP       Followed by  . [START ON 05/24/2015] LORazepam (ATIVAN) tablet 1 mg  1 mg Oral Daily Niel Hummer, NP      . magnesium hydroxide (MILK OF MAGNESIA) suspension 30 mL  30 mL Oral Daily PRN Niel Hummer, NP      . multivitamin with minerals tablet 1 tablet  1 tablet Oral Daily Niel Hummer, NP   1 tablet at 05/22/15 0820  . nicotine (NICODERM CQ - dosed in mg/24 hours) patch 21 mg  21 mg Transdermal Daily Nicholaus Bloom, MD   21 mg at 05/22/15 0826  . ondansetron (ZOFRAN-ODT) disintegrating tablet 4 mg  4 mg Oral Q6H PRN Niel Hummer, NP   4 mg at 05/21/15 1441  . sertraline (ZOLOFT) tablet 25 mg  25 mg Oral Daily Alonni Heimsoth, MD   25 mg at 05/22/15 0820  . thiamine (VITAMIN B-1) tablet 100 mg  100 mg Oral Daily Niel Hummer, NP   100 mg at 05/22/15 0820  . traZODone (DESYREL) tablet 100 mg  100 mg Oral QHS PRN Niel Hummer, NP   100 mg at 05/21/15 2141    Lab Results: No results found for this or any previous visit (from the past 48 hour(s)).  Blood Alcohol level:  Lab Results  Component Value Date   ETH 124* 05/19/2015   ETH 204* 05/04/2014    Physical Findings: AIMS: Facial and Oral Movements Muscles of Facial Expression: None, normal Lips and Perioral Area: None, normal Jaw: None, normal Tongue: None, normal,Extremity Movements Upper (arms, wrists, hands, fingers): None, normal Lower (legs, knees, ankles, toes): None, normal, Trunk Movements Neck, shoulders, hips: None, normal, Overall Severity Severity of abnormal movements (highest score from questions above): None, normal Incapacitation due to abnormal movements: None, normal Patient's awareness of abnormal movements (rate only patient's report): No Awareness, Dental Status Current problems with teeth and/or dentures?: No Does patient usually wear dentures?:  No  CIWA:  CIWA-Ar Total: 4 COWS:  COWS Total Score: 5  Musculoskeletal: Strength & Muscle Tone: within normal limits Gait & Station: normal Patient leans: N/A  Psychiatric Specialty Exam: ROS  Blood pressure 90/47, pulse 97, temperature 99.3 F (37.4 C), temperature source Oral, resp. rate 18, height 6\' 1"  (1.854 m), weight 215 lb (97.523 kg).Body mass index is 28.37 kg/(m^2).  General Appearance: Casual  Eye Contact::  Fair  Speech:  Slow  Volume:  Decreased  Mood:  Anxious, Depressed and Irritable  Affect:  Constricted and Depressed  Thought Process:  Coherent  Orientation:  Full (  Time, Place, and Person)  Thought Content:  WDL  Suicidal Thoughts:  No  Homicidal Thoughts:  No  Memory:  Immediate;   Fair Recent;   Fair Remote;   Fair  Judgement:  Fair  Insight:  Present  Psychomotor Activity:  Decreased  Concentration:  Fair  Recall:  [employer] of Knowledge:Fair  Language: Fair  Akathisia:  No  Handed:  Right  AIMS (if indicated):     Assets:  Desire for Improvement  ADL's:  Intact  Cognition: WNL  Sleep:  Number of Hours: 6.75   Treatment Plan Summary: Daily contact with patient to assess and evaluate symptoms and progress in treatment and Medication management   Alcohol dependence Continue alcohol detox protocol Patient interested in starting acamprosate and can be started after his detox is complete Attend group therapy and AA meetings on unit  Mood disorder Continue gabapentin at 100 mg 3 times daily, titrate up to 600 mg twice daily Continue Zoloft at 25 mg daily and plan to titrate as needed.  Suicidal ideations Patient to develop action alternatives to suicidal thoughts Attend groups and develop coping skills to deal with emotional dysregulation  Jahsir Rama, MD 05/22/2015, 11:22 AM

## 2015-05-22 NOTE — Progress Notes (Signed)
Patient ID: Chad Morgan, male   DOB: [birth year], 35 y.o.   MRN: [id number] D: Client in bed most of the evening, reports "kind of rough" day. A: Writer reviewed medication, administered as ordered. Staff will monitor q59min for safety. R: client is safe on the unit, did not attend group.

## 2015-05-22 NOTE — Progress Notes (Signed)
[place] Group Notes:  (Nursing/MHT/Case Management/Adjunct)  Date:  05/22/2015  Time:  3:38 PM  Type of Therapy:  Psychoeducational Skills  Participation Level:  Did Not Attend  Participation Quality:  did not attend  Affect:  Did not attend   Cognitive:  did not attend  Insight:  None  Engagement in Group:  did not attend  Modes of Intervention:  did not attend  Summary of Progress/Problems: Pt did not attend group.   Abe People Brittini 05/22/2015, 3:38 PM

## 2015-05-22 NOTE — BHH Group Notes (Signed)
Patient did not attend group.
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
## 2015-05-22 NOTE — Progress Notes (Signed)
D: Patient continues the ativan protocol.  Campral was added to start in the am.  His neurontin was also increased to 200 mg.  He has been compliant with his medications.  Patient is pleasant upon approach.  His affect remains flat, blunted.  He continues to complain of anxiety.  He denies SI/HI/AVH.   A: Continue to monitor medication management and MD orders.  Safety checks completed every 15 minutes per protocol. Offer support and encouragement as needed. R: Patient is receptive to staff; his behavior is appropriate.

## 2015-05-22 NOTE — BHH Group Notes (Signed)
[place] LCSW Group Therapy 05/22/2015  1:15 PM   Type of Therapy: Group Therapy  Participation Level: Did Not Attend. Patient invited to participate but declined.   Tilden Fossa, MSW, [place] Clinical Social Worker [facility] [phone]

## 2015-05-22 NOTE — Progress Notes (Signed)
Recreation Therapy Notes  Date: 05.17.2017 Time: 9:30am Location: [address] Group Room   Group Topic: Stress Management  Goal Area(s) Addresses:  Patient will actively participate in stress management techniques presented during session.   Behavioral Response: Did not attend.   Laureen Ochs Genessis Flanary, LRT/CTRS        Seleste Tallman L 05/22/2015 2:12 PM

## 2015-05-22 NOTE — BHH Suicide Risk Assessment (Signed)
[place] INPATIENT:  Family/Significant Other Suicide Prevention Education  Suicide Prevention Education:  Patient Refusal for Family/Significant Other Suicide Prevention Education: The patient MIGUEL LIANG has refused to provide written consent for family/significant other to be provided Family/Significant Other Suicide Prevention Education during admission and/or prior to discharge.  Physician notified. SPE reviewed with patient and brochure provided. Patient encouraged to return to hospital if having suicidal thoughts, patient verbalized his/her understanding and has no further questions at this time.   Jailyn Leeson, Casimiro Needle 05/22/2015, 11:30 AM

## 2015-05-23 MED ORDER — GABAPENTIN 300 MG PO CAPS
600.0000 mg | ORAL_CAPSULE | Freq: Three times a day (TID) | ORAL | Status: DC
  Administered 2015-05-23 – 2015-05-26 (×14): 600 mg via ORAL
  Filled 2015-05-23 (×20): qty 2

## 2015-05-23 MED ORDER — TRAZODONE HCL 150 MG PO TABS
150.0000 mg | ORAL_TABLET | Freq: Every evening | ORAL | Status: DC | PRN
  Administered 2015-05-23 – 2015-05-26 (×4): 150 mg via ORAL
  Filled 2015-05-23 (×4): qty 1
  Filled 2015-05-23: qty 14

## 2015-05-23 NOTE — Plan of Care (Signed)
Problem: Safety: Goal: Ability to remain free from injury will improve Outcome: Progressing Denies SI/HI  Problem: Activity: Goal: Sleeping patterns will improve Outcome: Not Progressing States he slept poorly last night- NP notified, meds adjusted.  Problem: Coping: Goal: Ability to cope will improve Outcome: Not Progressing States anxiety is worse today, NP notified, med adjustments made.

## 2015-05-23 NOTE — Progress Notes (Addendum)
Data Reports sleeping poorly with PRN sleep med.  Did not complete inventory, requested patient complete inventory at this time.  Affect blunted, appropriate mood "depressed."  Denies HI, SI, AVH.   Rates anxiety 8/10 "I normally take 600 mg of gabapentin at home for anxiety, all you are giving me is 200."   Action Educated patient wave at people on night shift if he's awake during checks.  Recommended to share difficulty sleeping, anxiety, and concerns regarding gabapentin with physician.  Remained on 15 minute checks.  Response Patient agrees to recommendation.  Has been laying in bed quietly with eyes closed this AM, missed AM group.   Addendum: Received patient self inventory sheet around 1100. Rates depression 4/10, hopelessness 2/10, and anxiety 8/10.  Goal to work on discharge.  Lindell Spar, NP saw patient and increased gabapentin to 600mg  qid, and increased trazodone to 150mg  daily.  Patient notified and agrees with changes.

## 2015-05-23 NOTE — Progress Notes (Signed)
Patient ID: Chad Morgan, male   DOB: 1980-04-06, 35 y.o.   MRN: [id number] D: Client is visible on the unit, in dayroom interacting. Client affect is brighter, smiling,he reports "feeling better today" "he got my anxiety medication straight" A: Writer provided emotional support, encourages client to report any concerns. Medications reviewed, administered as ordered. Staff will monitor q41min for safety. R:Client is safe on the unit, attended karaoke.
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
## 2015-05-23 NOTE — Progress Notes (Signed)
Patient ID: Chad Morgan, male   DOB: Sep 21, 1980, 35 y.o.   MRN: [id number]   Adult Psychoeducational Group Note  Date:  05/23/2015 Time: 09:00am   Group Topic/Focus:  Developing a Wellness Toolbox:   The focus of this group is to help patients develop a "wellness toolbox" with skills and strategies to promote recovery upon discharge.  Participation Level: n/a  Participation Quality: n/a  Affect: n/a  Cognitive: n/a  Insight: n/a  Engagement in Group: n/a  Modes of Intervention:  Activity, Discussion, Education and Support  Additional Comments:  Pt did not attend group today, pt in bed asleep.   Elenore Rota 05/23/2015, 10:00 AM

## 2015-05-23 NOTE — Progress Notes (Signed)
Patient ID: SUPREME CEFALO, male   DOB: 1980/11/19, 35 y.o.   MRN: [id number] [facility] MD Progress Note  05/23/2015 2:29 PM Chad Morgan  MRN:  [id number]  Subjective:  Chad Morgan reports, "I'm still having it rough. The agitations, high anxiety levels, not sleeping at night, they are taking a toll on me. I feel agitated, sweaty & shaky. Gabapentin on a higher dose normally will help me. Can you do something about increasing the Gabapentin for me?   Objective: Chad Morgan is seen today, chart reviewed. He was seen lying down in his bed. Says he could not go outside during brief break because he was feeling bad. He reports that he could not sleep last night. States that the trazodone does not help him. Patient very insistent on increasing his gabapentin to 600 mg qid daily. States his withdrawal symptoms are ongoing. Has started on acamprosate for alcoholism. Denies any suicidal thoughts today. Presents with a flat affect and depressed mood. He denies any SIHI, AVH.  Principal Problem: <principal problem not specified> Diagnosis:   Patient Active Problem List   Diagnosis Date Noted  . Severe episode of recurrent major depressive disorder, without psychotic features ([place]) [F33.2]   . Alcohol use disorder, severe, dependence ([place]) [F10.20] 05/20/2015  . Alcohol dependence with withdrawal with complication ([place]) 123456   . Major depressive disorder, recurrent, severe without psychotic features ([place]) [F33.2]   . PTSD (post-traumatic stress disorder) [F43.10] 04/25/2014  . Severe major depression without psychotic features ([place]) [F32.2] 04/24/2014  . Suicidal ideations [R45.851]   . Alcohol dependence with withdrawal, uncomplicated ([place]) [id number] 04/23/2014  . Alcohol dependence with intoxication with complication ([place]) 0000000 04/23/2014  . Tobacco abuse [Z72.0] 04/16/2014  . Alcohol withdrawal ([place]) [F10.239] 04/16/2014  . Alcohol dependence with uncomplicated withdrawal ([place]) [F10.230]   . Major  depressive disorder, recurrent episode, moderate ([name]) [F33.1]   . Social anxiety disorder [F40.10] 01/09/2014  . Panic attacks [F41.0] 01/09/2014  . GAD (generalized anxiety disorder) [F41.1] 01/09/2014  . Alcohol dependence ([place]) [F10.20] 01/08/2014   Total Time spent with patient: 15 minutes  Past Psychiatric History: Multiple hospitalizations at this hospital  Past Medical History:  Past Medical History  Diagnosis Date  . Anxiety   . Depression   . Alcohol abuse   . PTSD (post-traumatic stress disorder)   . Seizures ([place])     with withdrawal    Past Surgical History  Procedure Laterality Date  . Wisdom tooth extraction     Family History:  Family History  Problem Relation Age of Onset  . Alcoholism Father    Family Psychiatric  History: As above  Social History:  History  Alcohol Use  . Yes    Comment:  1/2 gallon of liquor daily     History  Drug Use  . Yes  . Special: Benzodiazepines, Marijuana    Comment: Patient denies     Social History   Social History  . Marital Status: Legally Separated    Spouse Name: N/A  . Number of Children: N/A  . Years of Education: N/A   Social History Main Topics  . Smoking status: Current Every Day Smoker -- 1.00 packs/day for 16 years    Types: Cigarettes  . Smokeless tobacco: Never Used  . Alcohol Use: Yes     Comment:  1/2 gallon of liquor daily  . Drug Use: Yes    Special: Benzodiazepines, Marijuana     Comment: Patient denies   . Sexual Activity: Yes  Other Topics Concern  . None   Social History Narrative   Additional Social History:   Sleep: Poor  Appetite:  Fair  Current Medications: Current Facility-Administered Medications  Medication Dose Route Frequency Provider Last Rate Last Dose  . acamprosate (CAMPRAL) tablet 666 mg  666 mg Oral TID WC Kerrie Buffalo, NP   666 mg at 05/23/15 1212  . acetaminophen (TYLENOL) tablet 650 mg  650 mg Oral Q6H PRN Niel Hummer, NP   650 mg at 05/23/15 0401   . alum & mag hydroxide-simeth (MAALOX/MYLANTA) 200-200-20 MG/5ML suspension 30 mL  30 mL Oral Q4H PRN Niel Hummer, NP      . feeding supplement (ENSURE ENLIVE) (ENSURE ENLIVE) liquid 237 mL  237 mL Oral Q24H Nicholaus Bloom, MD   237 mL at 05/22/15 2056  . gabapentin (NEURONTIN) capsule 600 mg  600 mg Oral TID WC & HS Encarnacion Slates, NP      . Derrill Memo ON 05/24/2015] LORazepam (ATIVAN) tablet 1 mg  1 mg Oral Daily Niel Hummer, NP      . magnesium hydroxide (MILK OF MAGNESIA) suspension 30 mL  30 mL Oral Daily PRN Niel Hummer, NP      . multivitamin with minerals tablet 1 tablet  1 tablet Oral Daily Niel Hummer, NP   1 tablet at 05/23/15 0756  . nicotine (NICODERM CQ - dosed in mg/24 hours) patch 21 mg  21 mg Transdermal Daily Nicholaus Bloom, MD   21 mg at 05/23/15 0756  . sertraline (ZOLOFT) tablet 25 mg  25 mg Oral Daily Himabindu Ravi, MD   25 mg at 05/23/15 0756  . thiamine (VITAMIN B-1) tablet 100 mg  100 mg Oral Daily Niel Hummer, NP   100 mg at 05/23/15 0756  . traZODone (DESYREL) tablet 100 mg  100 mg Oral QHS PRN Niel Hummer, NP   100 mg at 05/22/15 2056    Lab Results: No results found for this or any previous visit (from the past 48 hour(s)).  Blood Alcohol level:  Lab Results  Component Value Date   ETH 124* 05/19/2015   ETH 204* 05/04/2014   Physical Findings: AIMS: Facial and Oral Movements Muscles of Facial Expression: None, normal Lips and Perioral Area: None, normal Jaw: None, normal Tongue: None, normal,Extremity Movements Upper (arms, wrists, hands, fingers): None, normal Lower (legs, knees, ankles, toes): None, normal, Trunk Movements Neck, shoulders, hips: None, normal, Overall Severity Severity of abnormal movements (highest score from questions above): None, normal Incapacitation due to abnormal movements: None, normal Patient's awareness of abnormal movements (rate only patient's report): No Awareness, Dental Status Current problems with teeth and/or  dentures?: No Does patient usually wear dentures?: No  CIWA:  CIWA-Ar Total: 2 COWS:  COWS Total Score: 5  Musculoskeletal: Strength & Muscle Tone: within normal limits Gait & Station: normal Patient leans: N/A  Psychiatric Specialty Exam: ROS  Blood pressure 124/71, pulse 111, temperature 97.4 F (36.3 C), temperature source Oral, resp. rate 16, height 6\' 1"  (1.854 m), weight 97.523 kg (215 lb).Body mass index is 28.37 kg/(m^2).  General Appearance: Casual  Eye Contact::  Fair  Speech:  Slow  Volume:  Decreased  Mood:  Anxious, Depressed and Irritable  Affect:  Constricted and Depressed  Thought Process:  Coherent  Orientation:  Full (Time, Place, and Person)  Thought Content:  WDL  Suicidal Thoughts:  No  Homicidal Thoughts:  No  Memory:  Immediate;   Fair  Recent;   Fair Remote;   Fair  Judgement:  Fair  Insight:  Present  Psychomotor Activity:  Decreased  Concentration:  Fair  Recall:  [employer] of Knowledge:Fair  Language: Fair  Akathisia:  No  Handed:  Right  AIMS (if indicated):     Assets:  Desire for Improvement  ADL's:  Intact  Cognition: WNL  Sleep:  Number of Hours: 6.5   Treatment Plan Summary: Daily contact with patient to assess and evaluate symptoms and progress in treatment and Medication management : Alcohol dependence: continue the alcohol detox protocols. Alcoholism: Continue the Acomprosate Patient interested in starting acamprosate and can be started after his detox is complete Attend group therapy and AA meetings on unit to learn coping skills to maintain sobriety after discharge. Mood disorder: Continue the Zoloft at 25 mg daily and plan to titrate as needed. Agitation: Increased the Gabapentin to600 mg 4 times daily. Insomnia: Increase the Trazodone 150 mg. Suicidal ideations: Patient to develop action alternatives to suicidal thoughts Attend groups and develop coping skills to deal with emotional dysregulation. Social worker to work on  discharge disposition & follow-up care.  Lindell Spar I, NP, PMHNP-BC 05/23/2015, 2:29 PM Agree with NP progress note as above

## 2015-05-24 NOTE — Progress Notes (Signed)
  [facility] Adult Case Management Discharge Plan :  Will you be returning to the same living situation after discharge:  No.Pt has screening for admission at [facility] on Monday 5/22 at 8am.  At discharge, do you have transportation home?: Yes,  taxi voucher-in another pt's chart who is sharing a cab with Dominica Severin. Pt must d/c no later than 7:20AM on Monday 5/22 for 8am screening.  Do you have the ability to pay for your medications: Yes,  mental health  Release of information consent forms completed and submitted to medical records by CSW.  Patient to Follow up at: Follow-up Information    Follow up with [facility].   Why:  Referral sent 5/17. If you are interested in program, please contact admissions office (Melissa) to check status of referral/waitlist. Thank you.    Contact information:   [place], [place]  Phone: [phone] Fax: [phone]      Follow up with [facility] On 05/27/2015.   Why:  Screening for possible admission on this date at 8:00AM. Please bring proof of [facility] residency/photo ID to this screening. Thank you.    Contact information:   [address] [place], [place] [address] Phone: [phone] Fax: [phone]      Follow up with [facility].   Why:  Walk in between 8am-9am Monday through Friday for hospital follow-up/medication management/assessment for counseling services.    Contact information:   [address], [place] [address] Phone: [phone] Fax: [phone]      Next level of care provider has access to [facility] and Suicide Prevention discussed: Yes,  SPE completed with pt, as he declined to consent to family contact. SPI pamphlet and mobile crisis information provided to pt and he was encouraged to share information with support network, ask questions, and talk about any concerns.   Have you used any form of tobacco in the last 30 days? (Cigarettes, Smokeless  Tobacco, Cigars, and/or Pipes): Yes  Has patient been referred to the Quitline?: Patient refused referral  Patient has been referred for addiction treatment: Yes  Smart, Alysse Rathe LCSW 05/24/2015, 3:14 PM

## 2015-05-24 NOTE — BHH Group Notes (Signed)
[facility] LCSW Aftercare Discharge Planning Group Note   05/24/2015 10:12 AM  Participation Quality:  Invited. DID NOT ATTEND. Pt chose to rest in bed.   Smart, Santiel Topper LCSW

## 2015-05-24 NOTE — BHH Group Notes (Signed)
[facility] LCSW Group Therapy  05/24/2015 3:15 PM  Type of Therapy:  Group Therapy  Participation Level:  Active  Participation Quality:  Attentive  Affect:  Appropriate   Cognitive:  Appropriate  Insight:  Engaged  Engagement in Therapy:  Improving  Modes of Intervention:  Confrontation, Discussion, Education, Exploration, Problem-solving, Rapport Building, Socialization and Support  Summary of Progress/Problems:Self Sabotage: Patients were asked to define what self sabotage means to them, identify examples of self sabotage in their own lives, and process ways to remain aware of self sabotaging behaviors in order to meet their goals and avoid negative thinking/behavior patterns. Chad Morgan was attentive and engaged during today' processing group. He shared that he often engages in self sabotaging behaviors and was able to identify self pity, overthinking a situation, arrogance/pride, and negative thinking as examples of self sabotage that he often uses. Chad Morgan shared that he also struggles with letting other get close to him. "I'm so used to losing people through death or getting hurt that I just don't let anyone get close to me." He continues to show progress in the group setting with improving insight.   Smart, Shadd Dunstan LCSW 05/24/2015, 3:15 PM

## 2015-05-24 NOTE — Progress Notes (Signed)
Patient ID: Chad Morgan, male   DOB: 21-Mar-1980, 35 y.o.   MRN: [id number]  DAR: Pt. Denies SI/HI and A/V Hallucinations. He reports sleep is good, appetite is good, energy level is normal, and concentration is good. He rates depression 2/10, hopelessness 0/10, and anxiety 2/10. Patient does not report any pain or discomfort at this time. Support and encouragement provided to the patient. Scheduled medications administered to patient per physician's orders. Patient is cooperative but minimal with this Probation officer. He keeps to himself for the most part but can be seen in the milieu at times. He denies any more withdrawal symptoms. Mood remains depressed and affect blunted. Q15 minute checks are maintained for safety.

## 2015-05-24 NOTE — Tx Team (Signed)
Interdisciplinary Treatment Plan Update (Adult) Date: 05/24/2015    Time Reviewed: 9:30 AM  Progress in Treatment: Attending groups: Yes Participating in groups: Yes Taking medication as prescribed: Yes Tolerating medication: Yes Family/Significant other contact made: SPE completed with pt as he did not consent to family contact.  Patient understands diagnosis: Yes Discussing patient identified problems/goals with staff: Yes Medical problems stabilized or resolved: Yes Denies suicidal/homicidal ideation: Yes Issues/concerns per patient self-inventory: Yes Other:  Discharge Plan or Barriers: [facility] and [facility] referrals faxed. Pt has screening for possible admission to [facility] on Monday 5/22 at 8am. He will share taxi with another pt also scheduled for screening at that time/date-voucher in other pt's chart. He requires 2 week supply of medications prior to d/c and SRA must be completed by Sunday afternoon. MD/staff made aware of this on Friday.   Reason for Continuation of Hospitalization:  Medication Stabilization   Comments: N/A  Estimated length of stay: 2 days (pt scheduled for 7:15AM d/c on Monday 5/22--[facility] screening).   Patient is a 35 year old male who presented to the hospital with SI and ETOH abuse. Pt reports primary trigger(s) for admission was divorce and medication noncompliance. Patient will benefit from crisis stabilization, medication evaluation, group therapy and psycho education in addition to case management for discharge planning. At discharge, it is recommended that Pt remain compliant with established discharge plan and continued treatment.   Review of initial/current patient goals per problem list:  1. Goal(s): Patient will participate in aftercare plan   Met: Yes   Target date: 3-5 days post admission date   As evidenced by: Patient will participate within aftercare plan AEB aftercare provider and housing plan at discharge  being identified.  5/16: Goal not met: CSW assessing for appropriate referrals for pt and will have follow up secured prior to d/c.   5/19: goal met. Pt has screening for admission on Monday at [facility].   2. Goal (s): Patient will exhibit decreased depressive symptoms and suicidal ideations.   Met: Yes   Target date: 3-5 days post admission date   As evidenced by: Patient will utilize self rating of depression at 3 or below and demonstrate decreased signs of depression or be deemed stable for discharge by MD.  5/16: Goal not met: Pt presents with flat affect and depressed mood.  Pt admitted with depression rating of 10.  Pt to show decreased sign of depression and a rating of 3 or less before d/c.     5/19: goal met. Pt rates depression as 2/10 and presents with pleasant mood/calm affect.   3. Goal(s): Patient will demonstrate decreased signs and symptoms of anxiety.   Met: Yes   Target date: 3-5 days post admission date   As evidenced by: Patient will utilize self rating of anxiety at 3 or below and demonstrated decreased signs of anxiety, or be deemed stable for discharge by MD  5/16: Goal not met: Pt presents with anxious mood and affect.  Pt admitted with anxiety rating of 10.  Pt to show decreased sign of anxiety and a rating of 3 or less before d/c.   5/19: Goal met. Pt rates anxiety as 4/10 and reports this was baseline. Pt appears to present with calm affect.   4. Goal(s): Patient will demonstrate decreased signs of withdrawal due to substance abuse   Met: Yes   Target date: 3-5 days post admission date   As evidenced by: Patient will produce a CIWA/COWS score of 0,  have stable vitals signs, and no symptoms of withdrawal  5/16: Goal met. No withdrawal symptoms reported at this time per medical chart.      Attendees: Patient:    Family:    Physician: Dr. Parke Poisson; Dr. Einar Grad  05/24/2015 3:12 PM   Nursing: Brita Romp RN 05/24/2015 3:12 PM   Clinical Social  Worker: Maxie Better, LCSW  05/24/2015 3:12 PM   Other: Peri Maris, [place]  05/24/2015 3:12 PM   Other: Norberto Sorenson, [place] 05/24/2015 3:12 PM   Other: Lars Pinks, Case Manager 05/24/2015 3:12 PM   Other: Agustina Caroli, May Augustin, NP 05/24/2015 3:12 PM   Other:      Scribe for Treatment Team:  Maxie Better, MSW, LCSW Clinical Social Worker 05/24/2015 3:12 PM

## 2015-05-24 NOTE — Progress Notes (Signed)
Recreation Therapy Notes  Date: 05.19.2017 Time: 9:30am Location: [address] Group Room   Group Topic: Stress Management  Goal Area(s) Addresses:  Patient will actively participate in stress management techniques presented during session.   Behavioral Response: Did not attend.    Laureen Ochs Letticia Bhattacharyya, LRT/CTRS        Lorry Anastasi L 05/24/2015 1:55 PM

## 2015-05-24 NOTE — Progress Notes (Signed)
Patient ID: Chad Morgan, male   DOB: 1980/12/31, 35 y.o.   MRN: [id number] Patient ID: Chad Morgan, male   DOB: [birth year], 35 y.o.   MRN: [id number] [facility] MD Progress Note  05/24/2015 2:57 PM Chad Morgan  MRN:  [id number]  Subjective:  Chad Morgan reports, "I'm doing well today. My mood is improving, appetite is improving, sleep is improving. The substance withdrawal symptoms improving. The plan is for me to go the [facility] on Monday Morning after discharge. I believe the increase on the dose of Neurontin helped a lot".   Objective: Chad Morgan is seen today, chart reviewed. He is in the day room with the other patients. He did go outside during brief breaks today because he says his feeling a lot better. He reports improved sleep, appetite & the withdrawal symptoms. States that the increase in the dose of the Trazodone & the Neurontin helped him a lot. He is participating in group sessions. Denies any suicidal thoughts today. Presents with a bright affect and not depressed mood. He denies any SIHI, AVH.  Principal Problem: <principal problem not specified> Diagnosis:   Patient Active Problem List   Diagnosis Date Noted  . Severe episode of recurrent major depressive disorder, without psychotic features ([place]) [F33.2]   . Alcohol use disorder, severe, dependence ([place]) [F10.20] 05/20/2015  . Alcohol dependence with withdrawal with complication ([place]) 123456   . Major depressive disorder, recurrent, severe without psychotic features ([place]) [F33.2]   . PTSD (post-traumatic stress disorder) [F43.10] 04/25/2014  . Severe major depression without psychotic features ([place]) [F32.2] 04/24/2014  . Suicidal ideations [R45.851]   . Alcohol dependence with withdrawal, uncomplicated ([place]) [id number] 04/23/2014  . Alcohol dependence with intoxication with complication ([place]) 0000000 04/23/2014  . Tobacco abuse [Z72.0] 04/16/2014  . Alcohol withdrawal ([place]) [F10.239] 04/16/2014  . Alcohol dependence with  uncomplicated withdrawal ([place]) [F10.230]   . Major depressive disorder, recurrent episode, moderate ([name]) [F33.1]   . Social anxiety disorder [F40.10] 01/09/2014  . Panic attacks [F41.0] 01/09/2014  . GAD (generalized anxiety disorder) [F41.1] 01/09/2014  . Alcohol dependence ([place]) [F10.20] 01/08/2014   Total Time spent with patient: 15 minutes  Past Psychiatric History: Multiple hospitalizations at this hospital  Past Medical History:  Past Medical History  Diagnosis Date  . Anxiety   . Depression   . Alcohol abuse   . PTSD (post-traumatic stress disorder)   . Seizures ([place])     with withdrawal    Past Surgical History  Procedure Laterality Date  . Wisdom tooth extraction     Family History:  Family History  Problem Relation Age of Onset  . Alcoholism Father    Family Psychiatric  History: As above  Social History:  History  Alcohol Use  . Yes    Comment:  1/2 gallon of liquor daily     History  Drug Use  . Yes  . Special: Benzodiazepines, Marijuana    Comment: Patient denies     Social History   Social History  . Marital Status: Legally Separated    Spouse Name: N/A  . Number of Children: N/A  . Years of Education: N/A   Social History Main Topics  . Smoking status: Current Every Day Smoker -- 1.00 packs/day for 16 years    Types: Cigarettes  . Smokeless tobacco: Never Used  . Alcohol Use: Yes     Comment:  1/2 gallon of liquor daily  . Drug Use: Yes    Special: Benzodiazepines, Marijuana  Comment: Patient denies   . Sexual Activity: Yes   Other Topics Concern  . None   Social History Narrative   Additional Social History:   Sleep: Good  Appetite:  Good  Current Medications: Current Facility-Administered Medications  Medication Dose Route Frequency Provider Last Rate Last Dose  . acamprosate (CAMPRAL) tablet 666 mg  666 mg Oral TID WC Kerrie Buffalo, NP   666 mg at 05/24/15 1143  . acetaminophen (TYLENOL) tablet 650 mg  650 mg Oral  Q6H PRN Niel Hummer, NP   650 mg at 05/23/15 0401  . alum & mag hydroxide-simeth (MAALOX/MYLANTA) 200-200-20 MG/5ML suspension 30 mL  30 mL Oral Q4H PRN Niel Hummer, NP      . feeding supplement (ENSURE ENLIVE) (ENSURE ENLIVE) liquid 237 mL  237 mL Oral Q24H Nicholaus Bloom, MD   237 mL at 05/23/15 1441  . gabapentin (NEURONTIN) capsule 600 mg  600 mg Oral TID WC & HS Encarnacion Slates, NP   600 mg at 05/24/15 1143  . magnesium hydroxide (MILK OF MAGNESIA) suspension 30 mL  30 mL Oral Daily PRN Niel Hummer, NP      . multivitamin with minerals tablet 1 tablet  1 tablet Oral Daily Niel Hummer, NP   1 tablet at 05/24/15 0829  . nicotine (NICODERM CQ - dosed in mg/24 hours) patch 21 mg  21 mg Transdermal Daily Nicholaus Bloom, MD   21 mg at 05/24/15 0830  . sertraline (ZOLOFT) tablet 25 mg  25 mg Oral Daily Himabindu Ravi, MD   25 mg at 05/24/15 0829  . thiamine (VITAMIN B-1) tablet 100 mg  100 mg Oral Daily Niel Hummer, NP   100 mg at 05/24/15 0829  . traZODone (DESYREL) tablet 150 mg  150 mg Oral QHS PRN Encarnacion Slates, NP   150 mg at 05/23/15 2126   Lab Results: No results found for this or any previous visit (from the past 48 hour(s)).  Blood Alcohol level:  Lab Results  Component Value Date   ETH 124* 05/19/2015   ETH 204* 05/04/2014   Physical Findings: AIMS: Facial and Oral Movements Muscles of Facial Expression: None, normal Lips and Perioral Area: None, normal Jaw: None, normal Tongue: None, normal,Extremity Movements Upper (arms, wrists, hands, fingers): None, normal Lower (legs, knees, ankles, toes): None, normal, Trunk Movements Neck, shoulders, hips: None, normal, Overall Severity Severity of abnormal movements (highest score from questions above): None, normal Incapacitation due to abnormal movements: None, normal Patient's awareness of abnormal movements (rate only patient's report): No Awareness, Dental Status Current problems with teeth and/or dentures?: No Does patient  usually wear dentures?: No  CIWA:  CIWA-Ar Total: 1 COWS:  COWS Total Score: 5  Musculoskeletal: Strength & Muscle Tone: within normal limits Gait & Station: normal Patient leans: N/A  Psychiatric Specialty Exam: ROS  Blood pressure 113/57, pulse 86, temperature 99 F (37.2 C), temperature source Oral, resp. rate 18, height 6\' 1"  (1.854 m), weight 97.523 kg (215 lb).Body mass index is 28.37 kg/(m^2).  General Appearance: Casual  Eye Contact::  Fair  Speech:  Slow  Volume:  Decreased  Mood:  Anxious, Depressed and Irritable  Affect:  Constricted and Depressed  Thought Process:  Coherent  Orientation:  Full (Time, Place, and Person)  Thought Content:  WDL  Suicidal Thoughts:  No  Homicidal Thoughts:  No  Memory:  Immediate;   Fair Recent;   Fair Remote;   Fair  Judgement:  Fair  Insight:  Present  Psychomotor Activity:  Decreased  Concentration:  Fair  Recall:  [employer] of Knowledge:Fair  Language: Fair  Akathisia:  No  Handed:  Right  AIMS (if indicated):     Assets:  Desire for Improvement  ADL's:  Intact  Cognition: WNL  Sleep:  Number of Hours: 5.75   Treatment Plan Summary: Daily contact with patient to assess and evaluate symptoms and progress in treatment and Medication management : Alcohol dependence: continue the alcohol detox protocols. Alcoholism: Continue the Acomprosate Patient interested in starting acamprosate and can be started after his detox is complete Attend group therapy and AA meetings on unit to learn coping skills to maintain sobriety after discharge. Mood disorder: Continue the Zoloft at 25 mg daily and plan to titrate as needed. Agitation: Continue the Gabapentin to 600 mg 4 times daily. Insomnia: Continue the Trazodone 150 mg. Suicidal ideations: Patient to develop action alternatives to suicidal thoughts Attend groups and develop coping skills to deal with emotional dysregulation. Social worker to work on discharge disposition &  follow-up care, possible discharge Monday 05-27-15 to the [place], NP, PMHNP-BC 05/24/2015, 2:57 PM Agree with NP progress note as above

## 2015-05-25 MED ORDER — PRAZOSIN HCL 1 MG PO CAPS
1.0000 mg | ORAL_CAPSULE | Freq: Every day | ORAL | Status: DC
  Administered 2015-05-25 – 2015-05-26 (×2): 1 mg via ORAL
  Filled 2015-05-25 (×4): qty 1

## 2015-05-25 NOTE — Progress Notes (Signed)
Chad Morgan is seen OOB UAL on the 300 hall this AM. He says he feels " rough". He takes his medications as scheduled. HE  Completed his daily assessment and onit he wrote he deneid SI today and he rated his depression, hopelessness and anxiety " 1/0/1", respectively. A he admits to this writer that he ' needs to do something different with my life..I feel stuck'. R he will begin Minipress tonight to help with sleep / nightmares. Safety in place. Encouragement offered by this Probation officer,

## 2015-05-25 NOTE — Progress Notes (Signed)
Psychoeducational Group Note  Date:  05/25/2015 Time: 1500  Group Topic/Focus:  Identifying Needs:   The focus of this group is to help patients identify their personal needs that have been historically problematic and identify healthy behaviors to address their needs.  Participation Level:  Minimal  Participation Quality:  Attentive  Affect:  Blunted  Cognitive:  Alert  Insight:  Engaged  Engagement in Group:  Engaged  Additional Comments:    05/25/2015,6:07 PM Aerianna Losey, Trixie Rude

## 2015-05-25 NOTE — BHH Group Notes (Signed)
[facility] Group Notes:  (Clinical Social Work)   11/03/2014     10:00-11:00AM  Summary of Progress/Problems:   In today's process group a decisional balance exercise was used to explore in depth the perceived benefits and costs of unhealthy coping skills including negative self-talk, suicide attempts, and drugs/alcohol, as well as the  benefits and costs of replacing these with healthy coping skills.  Patients listed healthy and unhealthy coping techniques, determining that unhealthy coping techniques work initially, but eventually become harmful.  Motivational Interviewing and the whiteboard were utilized for the exercises.  The patient expressed that the unhealthy coping he most recently used was substance abuse.  He made some contributions to group, but most listened.   Type of Therapy:  Group Therapy - Process   Participation Level:  Active  Participation Quality:  Attentive  Affect:  Blunted and Depressed  Cognitive:  Appropriate  Insight:  Developing/Improving  Engagement in Therapy:  Engaged  Modes of Intervention:  Education, Motivational Interviewing  Selmer Dominion, LCSW 05/25/2015, 12:37 PM

## 2015-05-25 NOTE — BHH Group Notes (Signed)
[facility] Group Notes:  (Nursing/MHT/Case Management/Adjunct)  Date:  05/25/2015  Time:  9:31 AM  Type of Therapy:  Psychoeducational Skills  Participation Level:  Active  Participation Quality:  Appropriate and Attentive  Affect:  Appropriate  Cognitive:  Alert and Appropriate  Insight:  Improving  Engagement in Group:  Improving  Modes of Intervention:  Support  Summary of Progress/Problems: Benjamyn states his medications are "working well for me."  His goal is to discharge on Monday.  Zipporah Plants 05/25/2015, 9:31 AM

## 2015-05-25 NOTE — Progress Notes (Signed)
The patient attended last evening's A.A. Meeting and was appropriate.

## 2015-05-25 NOTE — Progress Notes (Signed)
D: Pt endorses moderate anxiety; states, "Anxiety has always been an issue for me" Pt however, denied depression, pain, SI, HI or AVH: states, "I will be going to [facility] when I leave this time." Pt was observed interacting with peers. Pt remained calm and cooperative. A: Medications offered as prescribed.  Support, encouragement, and safe environment provided.  15-minute safety checks continue. R: Pt was med compliant.  Pt attended [place] group. Safety checks continue.

## 2015-05-25 NOTE — Progress Notes (Signed)
D.  Pt pleasant on approach, denies complaints at this time.  Positive for evening AA group, interacting appropriately with peers on the unit.  Denies SI/HI/hallucinations at this time.  A.  Support and encouragement offered, medication given as ordered  R.  Pt remains safe on the unit, will continue to monitor.

## 2015-05-25 NOTE — Progress Notes (Signed)
Patient did attend the evening speaker AA meeting.  

## 2015-05-25 NOTE — Progress Notes (Signed)
Patient ID: Chad Morgan, male   DOB: 1980-01-31, 35 y.o.   MRN: [id number] [facility] MD Progress Note  05/25/2015 3:34 PM Chad Morgan  MRN:  [id number]  Subjective:  Chad Morgan reports, "Im pretty good. Meds seemed to be working no withdrawals at this time. I have noticed to be having more vivid dreams at night time. Is that the Trazodone doing that, since I started my dreams seem to be intensifying. ".   Objective: Chad Morgan is seen today, chart reviewed. He is in the day room with the other patients. He did go outside during brief breaks today because he says his feeling a lot better. He is also attending group sessions with active participation and he is noted that is his goal today is to shave and list coping life skills.  He reports improved sleep, appetite & the withdrawal symptoms. He is participating in group sessions. Denies any suicidal thoughts today. Presents with a bright affect and not depressed mood. He currently rates his depression 1-2/10 and anxiety 1-2/10 with 0 being the least and 10 being the worse.  He denies any SIHI, AVH.   Principal Problem: <principal problem not specified> Diagnosis:   Patient Active Problem List   Diagnosis Date Noted  . Severe episode of recurrent major depressive disorder, without psychotic features ([place]) [F33.2]   . Alcohol use disorder, severe, dependence ([place]) [F10.20] 05/20/2015  . Alcohol dependence with withdrawal with complication ([place]) 123456   . Major depressive disorder, recurrent, severe without psychotic features ([place]) [F33.2]   . PTSD (post-traumatic stress disorder) [F43.10] 04/25/2014  . Severe major depression without psychotic features ([place]) [F32.2] 04/24/2014  . Suicidal ideations [R45.851]   . Alcohol dependence with withdrawal, uncomplicated ([place]) [id number] 04/23/2014  . Alcohol dependence with intoxication with complication ([place]) 0000000 04/23/2014  . Tobacco abuse [Z72.0] 04/16/2014  . Alcohol withdrawal ([place]) [F10.239] 04/16/2014   . Alcohol dependence with uncomplicated withdrawal ([place]) [F10.230]   . Major depressive disorder, recurrent episode, moderate ([name]) [F33.1]   . Social anxiety disorder [F40.10] 01/09/2014  . Panic attacks [F41.0] 01/09/2014  . GAD (generalized anxiety disorder) [F41.1] 01/09/2014  . Alcohol dependence ([place]) [F10.20] 01/08/2014   Total Time spent with patient: 15 minutes  Past Psychiatric History: Multiple hospitalizations at this hospital  Past Medical History:  Past Medical History  Diagnosis Date  . Anxiety   . Depression   . Alcohol abuse   . PTSD (post-traumatic stress disorder)   . Seizures ([place])     with withdrawal    Past Surgical History  Procedure Laterality Date  . Wisdom tooth extraction     Family History:  Family History  Problem Relation Age of Onset  . Alcoholism Father    Family Psychiatric  History: As above  Social History:  History  Alcohol Use  . Yes    Comment:  1/2 gallon of liquor daily     History  Drug Use  . Yes  . Special: Benzodiazepines, Marijuana    Comment: Patient denies     Social History   Social History  . Marital Status: Legally Separated    Spouse Name: N/A  . Number of Children: N/A  . Years of Education: N/A   Social History Main Topics  . Smoking status: Current Every Day Smoker -- 1.00 packs/day for 16 years    Types: Cigarettes  . Smokeless tobacco: Never Used  . Alcohol Use: Yes     Comment:  1/2 gallon of liquor daily  . Drug  Use: Yes    Special: Benzodiazepines, Marijuana     Comment: Patient denies   . Sexual Activity: Yes   Other Topics Concern  . None   Social History Narrative   Additional Social History:   Sleep: Good  Appetite:  Good  Current Medications: Current Facility-Administered Medications  Medication Dose Route Frequency Provider Last Rate Last Dose  . acamprosate (CAMPRAL) tablet 666 mg  666 mg Oral TID WC Kerrie Buffalo, NP   666 mg at 05/25/15 1322  . acetaminophen (TYLENOL)  tablet 650 mg  650 mg Oral Q6H PRN Niel Hummer, NP   650 mg at 05/25/15 [id number]  . alum & mag hydroxide-simeth (MAALOX/MYLANTA) 200-200-20 MG/5ML suspension 30 mL  30 mL Oral Q4H PRN Niel Hummer, NP      . feeding supplement (ENSURE ENLIVE) (ENSURE ENLIVE) liquid 237 mL  237 mL Oral Q24H Nicholaus Bloom, MD   237 mL at 05/23/15 1441  . gabapentin (NEURONTIN) capsule 600 mg  600 mg Oral TID WC & HS Encarnacion Slates, NP   600 mg at 05/25/15 1322  . magnesium hydroxide (MILK OF MAGNESIA) suspension 30 mL  30 mL Oral Daily PRN Niel Hummer, NP      . multivitamin with minerals tablet 1 tablet  1 tablet Oral Daily Niel Hummer, NP   1 tablet at 05/25/15 [phone]  . nicotine (NICODERM CQ - dosed in mg/24 hours) patch 21 mg  21 mg Transdermal Daily Nicholaus Bloom, MD   21 mg at 05/25/15 0800  . sertraline (ZOLOFT) tablet 25 mg  25 mg Oral Daily Himabindu Ravi, MD   25 mg at 05/25/15 [id number]  . thiamine (VITAMIN B-1) tablet 100 mg  100 mg Oral Daily Niel Hummer, NP   100 mg at 05/25/15 [id number]  . traZODone (DESYREL) tablet 150 mg  150 mg Oral QHS PRN Encarnacion Slates, NP   150 mg at 05/24/15 2129   Lab Results: No results found for this or any previous visit (from the past 48 hour(s)).  Blood Alcohol level:  Lab Results  Component Value Date   ETH 124* 05/19/2015   ETH 204* 05/04/2014   Physical Findings: AIMS: Facial and Oral Movements Muscles of Facial Expression: None, normal Lips and Perioral Area: None, normal Jaw: None, normal Tongue: None, normal,Extremity Movements Upper (arms, wrists, hands, fingers): None, normal Lower (legs, knees, ankles, toes): None, normal, Trunk Movements Neck, shoulders, hips: None, normal, Overall Severity Severity of abnormal movements (highest score from questions above): None, normal Incapacitation due to abnormal movements: None, normal Patient's awareness of abnormal movements (rate only patient's report): No Awareness, Dental Status Current problems with teeth and/or  dentures?: No Does patient usually wear dentures?: No  CIWA:  CIWA-Ar Total: 2 COWS:  COWS Total Score: 5  Musculoskeletal: Strength & Muscle Tone: within normal limits Gait & Station: normal Patient leans: N/A  Psychiatric Specialty Exam: Review of Systems  Psychiatric/Behavioral: Positive for depression. Negative for suicidal ideas, hallucinations, memory loss and substance abuse. The patient is not nervous/anxious and does not have insomnia.        Dreams and nightmares    Blood pressure 112/59, pulse 74, temperature 97.7 F (36.5 C), temperature source Oral, resp. rate 16, height 6\' 1"  (1.854 m), weight 97.523 kg (215 lb).Body mass index is 28.37 kg/(m^2).  General Appearance: Casual  Eye Contact::  Fair  Speech:  Clear and Coherent and Normal Rate  Volume:  Normal  Mood:  Depressed  Affect:  Depressed and Flat  Thought Process:  Circumstantial and Coherent  Orientation:  Full (Time, Place, and Person)  Thought Content:  WDL  Suicidal Thoughts:  No  Homicidal Thoughts:  No  Memory:  Immediate;   Fair Recent;   Fair Remote;   Fair  Judgement:  Fair  Insight:  Present  Psychomotor Activity:  Normal  Concentration:  Fair  Recall:  [employer] of Knowledge:Fair  Language: Fair  Akathisia:  No  Handed:  Right  AIMS (if indicated):     Assets:  Desire for Improvement  ADL's:  Intact  Cognition: WNL  Sleep:  Number of Hours: 6.25   Treatment Plan Summary: Daily contact with patient to assess and evaluate symptoms and progress in treatment and Medication management : Alcohol dependence: continue the alcohol detox protocols. Alcoholism: Continue the Acomprosate Patient interested in starting acamprosate and can be started after his detox is complete Attend group therapy and AA meetings on unit to learn coping skills to maintain sobriety after discharge. Mood disorder: Continue the Zoloft at 25 mg daily and plan to titrate as needed. Agitation: Continue the Gabapentin to  600 mg 4 times daily. Insomnia: Continue the Trazodone 150 mg. Dreams/Nightmares: Will add Minipress 1mg  po qhs.  Suicidal ideations: Patient to develop action alternatives to suicidal thoughts Attend groups and develop coping skills to deal with emotional dysregulation. Social worker to work on discharge disposition & follow-up care, possible discharge Monday 05-27-15 to the [facility], [place],  05/25/2015, 3:34 PM

## 2015-05-26 ENCOUNTER — Encounter (HOSPITAL_COMMUNITY): Payer: Self-pay | Admitting: Registered Nurse

## 2015-05-26 MED ORDER — ACAMPROSATE CALCIUM 333 MG PO TBEC: 666.0000 mg | DELAYED_RELEASE_TABLET | Freq: Three times a day (TID) | ORAL | Status: DC

## 2015-05-26 MED ORDER — NICOTINE 21 MG/24HR TD PT24: 21.0000 mg | MEDICATED_PATCH | Freq: Every day | TRANSDERMAL | Status: DC

## 2015-05-26 MED ORDER — TRAZODONE HCL 150 MG PO TABS: 150.0000 mg | ORAL_TABLET | Freq: Every evening | ORAL | Status: DC | PRN

## 2015-05-26 MED ORDER — ADULT MULTIVITAMIN W/MINERALS CH: 1.0000 | ORAL_TABLET | Freq: Every day | ORAL | Status: DC

## 2015-05-26 MED ORDER — THIAMINE HCL 100 MG PO TABS: 100.0000 mg | ORAL_TABLET | Freq: Every day | ORAL | Status: DC

## 2015-05-26 MED ORDER — GABAPENTIN 300 MG PO CAPS: 600.0000 mg | ORAL_CAPSULE | Freq: Three times a day (TID) | ORAL | Status: DC

## 2015-05-26 MED ORDER — GABAPENTIN 600 MG PO TABS
600.0000 mg | ORAL_TABLET | Freq: Three times a day (TID) | ORAL | Status: DC
  Administered 2015-05-26: 600 mg via ORAL
  Filled 2015-05-26 (×3): qty 1

## 2015-05-26 MED ORDER — SERTRALINE HCL 25 MG PO TABS: 25.0000 mg | ORAL_TABLET | Freq: Every day | ORAL | Status: DC

## 2015-05-26 MED ORDER — PRAZOSIN HCL 1 MG PO CAPS
1.0000 mg | ORAL_CAPSULE | Freq: Every day | ORAL | Status: DC
Start: 2015-05-26 — End: 2017-03-20

## 2015-05-26 NOTE — Progress Notes (Signed)
D Thor is UAL on the [facility] hall this morning. He takes his scheduled emds as planned. He is pleasant and cooperative on approach. A He completes his daily assessment and on it he wrote he deneid SI today and eh rated his depression, hopelessness and anxiety " 1/0/1", respectively. HE says " I'm glad I am here.Marland Kitchen  getting the help that I need". R Safety in place  And poc cont.

## 2015-05-26 NOTE — Discharge Summary (Signed)
Physician Discharge Summary Note  Patient:  Chad Morgan is an 35 y.o., male MRN:  [id number] DOB:  1980/03/12 Patient phone:  [phone] (home)  Patient address:   [id number] [place] [address],  Total Time spent with patient: Greater than 30 minutes  Date of Admission:  05/20/2015  Date of Discharge: 05/27/2015  Reason for Admission: Alcohol detox/Suicidal Ideation  Principal Problem: Severe episode of recurrent major depressive disorder, without psychotic features [facility]) Discharge Diagnoses: Patient Active Problem List   Diagnosis Date Noted  . Severe episode of recurrent major depressive disorder, without psychotic features ([place]) [F33.2]   . Alcohol use disorder, severe, dependence ([place]) [F10.20] 05/20/2015  . Alcohol dependence with withdrawal with complication ([place]) 123456   . Major depressive disorder, recurrent, severe without psychotic features ([place]) [F33.2]   . PTSD (post-traumatic stress disorder) [F43.10] 04/25/2014  . Severe major depression without psychotic features ([place]) [F32.2] 04/24/2014  . Suicidal ideations [R45.851]   . Alcohol dependence with withdrawal, uncomplicated ([place]) A999333 04/23/2014  . Alcohol dependence with intoxication with complication ([place]) 0000000 04/23/2014  . Tobacco abuse [Z72.0] 04/16/2014  . Alcohol withdrawal ([place]) [F10.239] 04/16/2014  . Alcohol dependence with uncomplicated withdrawal ([place]) [F10.230]   . Major depressive disorder, recurrent episode, moderate ([name]) [F33.1]   . Social anxiety disorder [F40.10] 01/09/2014  . Panic attacks [F41.0] 01/09/2014  . GAD (generalized anxiety disorder) [F41.1] 01/09/2014  . Alcohol dependence ([place]) [F10.20] 01/08/2014   Musculoskeletal: Strength & Muscle Tone: within normal limits Gait & Station: normal Patient leans: N/A  Psychiatric Specialty Exam: Physical Exam  Psychiatric: His speech is normal and behavior is normal. Judgment and thought content normal. His  mood appears not anxious. His affect is not angry, not blunt, not labile and not inappropriate. Cognition and memory are normal. He does not exhibit a depressed mood.    Review of Systems  Constitutional: Negative.   HENT: Negative.   Eyes: Negative.   Respiratory: Negative.   Cardiovascular: Negative.   Gastrointestinal: Negative.   Genitourinary: Negative.   Musculoskeletal: Negative.   Skin: Negative.   Neurological: Negative.   Endo/Heme/Allergies: Negative.   Psychiatric/Behavioral: Positive for depression and substance abuse (Alcohol dependence). Negative for suicidal ideas, hallucinations and memory loss. The patient has insomnia (Stable). The patient is not nervous/anxious.     Blood pressure 84/57, pulse 91, temperature 97.8 F (36.6 C), temperature source Oral, resp. rate 16, height 6\' 1"  (1.854 m), weight 97.523 kg (215 lb).Body mass index is 28.37 kg/(m^2).  See Md's SRA   Past Medical History:  Past Medical History  Diagnosis Date  . Anxiety   . Depression   . Alcohol abuse   . PTSD (post-traumatic stress disorder)   . Seizures ([place])     with withdrawal    Past Surgical History  Procedure Laterality Date  . Wisdom tooth extraction     Family History:  Family History  Problem Relation Age of Onset  . Alcoholism Father    Social History:  History  Alcohol Use  . Yes    Comment:  1/2 gallon of liquor daily     History  Drug Use  . Yes  . Special: Benzodiazepines, Marijuana    Comment: Patient denies     Social History   Social History  . Marital Status: Legally Separated    Spouse Name: N/A  . Number of Children: N/A  . Years of Education: N/A   Social History Main Topics  . Smoking status: Current  Every Day Smoker -- 1.00 packs/day for 16 years    Types: Cigarettes  . Smokeless tobacco: Never Used  . Alcohol Use: Yes     Comment:  1/2 gallon of liquor daily  . Drug Use: Yes    Special: Benzodiazepines, Marijuana     Comment: Patient denies    . Sexual Activity: Yes   Other Topics Concern  . None   Social History Narrative   Risk to Self: Is patient at risk for suicide?: Yes  Risk to Others: No   Prior Inpatient Therapy: Yes  Prior Outpatient Therapy: Yes  Level of Care:  RTC  Hospital Course:  Chad Morgan is an 35 y.o. male who presents unaccompanied to [facility] ED reporting suicidal ideation and alcohol dependence. Pt has a history of major depressive disorder and alcohol use. He was at [facility] 05/04/15-05/08/15 and states he relapsed on alcohol shortly after discharge. He also did not take prescribed medication. He reports feeling severely depressed with symptoms including crying spells, social withdrawal, loss of interest in usual pleasures, fatigue, irritability, decreased concentration, decreased sleep, decreased appetite and feelings of guilt and hopelessness. Pt reports he is sleeping four hours per night and has lost ten pounds in the past month. He states yesterday he put a loaded gun to his head and discharged it in front of his face. He says he has experienced suicidal thoughts in the past but this was his first suicide attempt. He describes experiencing generalized anxiety and says he stays up at night worrying and thinking about his problems. He denies current homicidal ideation or history of violence. He denies any history of psychotic symptoms.  Pt reports he has been drinking one-half to one half gallon of liquor daily since 05/09/15. He reports drinking two beers and two shots of liquor in the past twenty four hours. He reports a history of withdrawal symptoms including nausea, vomiting, diarrhea, sweats and tremors. He reports a history of blackouts and say he had an alcohol related seizure in 2015 when he tried to detox himself off alcohol. Pt denies other substance use. Pt's blood alcohol level is 124 and urine drug screen is negative. Patient's Neurontin was increased to 600 mg TID for anxiety/mood  control. He was also started on zoloft 25 mg daily for depression and minipress 1 mg at bedtime for reports of nightmares related to past trauma.   Kroix has completed detox treatment with the ativan treatment. He is currently being discharged to continue treatment at the [facility] treatment center in [place], [place]. He has been given all the necessary information needed to make this appointment without problems. Upon discharge, he denies any SIHI, AVH, delusional thoughts, paranoia and or substance withdrawal symptoms. He received a 14 days worth, supply samples of his [facility] discharge medications. He left [facility] with all personal belongings in no distress.   Consults:  psychiatry  Significant Diagnostic Studies:  labs: CBC with diff, CMP, UDS, toxicology tests, U/A  Discharge Vitals:   Blood pressure 84/57, pulse 91, temperature 97.8 F (36.6 C), temperature source Oral, resp. rate 16, height 6\' 1"  (1.854 m), weight 97.523 kg (215 lb). Body mass index is 28.37 kg/(m^2). Lab Results:   No results found for this or any previous visit (from the past 72 hour(s)).  Physical Findings: AIMS: Facial and Oral Movements Muscles of Facial Expression: None, normal Lips and Perioral Area: None, normal Jaw: None, normal Tongue: None, normal,Extremity Movements Upper (arms, wrists, hands, fingers): None, normal  Lower (legs, knees, ankles, toes): None, normal, Trunk Movements Neck, shoulders, hips: None, normal, Overall Severity Severity of abnormal movements (highest score from questions above): None, normal Incapacitation due to abnormal movements: None, normal Patient's awareness of abnormal movements (rate only patient's report): No Awareness, Dental Status Current problems with teeth and/or dentures?: No Does patient usually wear dentures?: No  CIWA:  CIWA-Ar Total: 1 COWS:  COWS Total Score: 5  See Psychiatric Specialty Exam and Suicide Risk Assessment completed by Attending Physician prior to  discharge.  Discharge destination:  [facility]  Is patient on multiple antipsychotic therapies at discharge:  No   Has Patient had three or more failed trials of antipsychotic monotherapy by history:  No  Recommended Plan for Multiple Antipsychotic Therapies: NA    Medication List    STOP taking these medications        QUEtiapine 50 MG tablet  Commonly known as:  SEROQUEL      TAKE these medications      Indication   acamprosate 333 MG tablet  Commonly known as:  CAMPRAL  Take 2 tablets (666 mg total) by mouth 3 (three) times daily with meals.   Indication:  Excessive Use of Alcohol     gabapentin 300 MG capsule  Commonly known as:  NEURONTIN  Take 2 capsules (600 mg total) by mouth 4 (four) times daily -  with meals and at bedtime.   Indication:  Agitation, Alcohol Withdrawal Syndrome     multivitamin with minerals Tabs tablet  Take 1 tablet by mouth daily.   Indication:  Vitamin Supplementation     nicotine 21 mg/24hr patch  Commonly known as:  NICODERM CQ - dosed in mg/24 hours  Place 1 patch (21 mg total) onto the skin daily.   Indication:  Nicotine Addiction     prazosin 1 MG capsule  Commonly known as:  MINIPRESS  Take 1 capsule (1 mg total) by mouth at bedtime.   Indication:  Nightmares     sertraline 25 MG tablet  Commonly known as:  ZOLOFT  Take 1 tablet (25 mg total) by mouth daily.   Indication:  Major Depressive Disorder     thiamine 100 MG tablet  Take 1 tablet (100 mg total) by mouth daily. For low thiamine   Indication:  Deficiency in Thiamine or Vitamin B1     thiamine 100 MG tablet  Take 1 tablet (100 mg total) by mouth daily.   Indication:  Deficiency in Thiamine or Vitamin B1     traZODone 100 MG tablet  Commonly known as:  DESYREL  Take 1 tablet (100 mg total) by mouth at bedtime. For sleep   Indication:  Trouble Sleeping     traZODone 150 MG tablet  Commonly known as:  DESYREL  Take 1 tablet (150 mg total) by mouth at  bedtime as needed for sleep.   Indication:  Trouble Sleeping       Follow-up Information    Follow up with [facility].   Why:  Referral sent 5/17. If you are interested in program, please contact admissions office (Melissa) to check status of referral/waitlist. Thank you.    Contact information:   [place], [place]  Phone: [phone] Fax: [phone]      Follow up with [facility] On 05/27/2015.   Why:  Screening for possible admission on this date at 8:00AM. Please bring proof of [facility] residency/photo ID to this screening. Thank you.    Contact  information:   [address] [place], [place] [address] Phone: [phone] Fax: [phone]      Follow up with [facility].   Why:  Walk in between 8am-9am Monday through Friday for hospital follow-up/medication management/assessment for counseling services.    Contact information:   [address], [place] [address] Phone: [phone] Fax: [phone]     Follow-up recommendations:  Activity:  As tolerated Diet: As recommended by your primary care doctor. Keep all scheduled follow-up appointments as recommended.  Comments: Take all your medications as prescribed by your mental healthcare provider. Report any adverse effects and or reactions from your medicines to your outpatient provider promptly. Patient is instructed and cautioned to not engage in alcohol and or illegal drug use while on prescription medicines. In the event of worsening symptoms, patient is instructed to call the crisis hotline, 911 and or go to the nearest ED for appropriate evaluation and treatment of symptoms. Follow-up with your primary care provider for your other medical issues, concerns and or health care needs.   Total Discharge Time: Greater than 30 minutes  Signed: Elmarie Shiley, NP-C 05/26/2015, 2:46 PM

## 2015-05-26 NOTE — Progress Notes (Signed)
Patient did attend the evening speaker AA meeting.  

## 2015-05-26 NOTE — Progress Notes (Signed)
D.  Pt pleasant on approach, denies complaints at this time.  Pt was positive for evening AA group, and was observed engaging in appropriate interaction with peers.  Pt will leave in AM for [facility], and he is aware of time to be ready.  Paperwork is complete and prescriptions/samples on chart.  Pt denies SI/HI/AVH at this time.  A.  Support and encouragement offered, medication given as ordered.  R.  Pt remains safe on the unit, will continue to monitor.

## 2015-05-26 NOTE — BHH Suicide Risk Assessment (Signed)
[facility] Discharge Suicide Risk Assessment   Principal Problem: Severe episode of recurrent major depressive disorder, without psychotic features [facility]) Discharge Diagnoses:  Patient Active Problem List   Diagnosis Date Noted  . Severe episode of recurrent major depressive disorder, without psychotic features ([place]) [F33.2]   . Alcohol use disorder, severe, dependence ([place]) [F10.20] 05/20/2015  . Alcohol dependence with withdrawal with complication ([place]) 123456   . Major depressive disorder, recurrent, severe without psychotic features ([place]) [F33.2]   . PTSD (post-traumatic stress disorder) [F43.10] 04/25/2014  . Severe major depression without psychotic features ([place]) [F32.2] 04/24/2014  . Suicidal ideations [R45.851]   . Alcohol dependence with withdrawal, uncomplicated ([place]) [id number] 04/23/2014  . Alcohol dependence with intoxication with complication ([place]) 0000000 04/23/2014  . Tobacco abuse [Z72.0] 04/16/2014  . Alcohol withdrawal ([place]) [F10.239] 04/16/2014  . Alcohol dependence with uncomplicated withdrawal ([place]) [F10.230]   . Major depressive disorder, recurrent episode, moderate ([name]) [F33.1]   . Social anxiety disorder [F40.10] 01/09/2014  . Panic attacks [F41.0] 01/09/2014  . GAD (generalized anxiety disorder) [F41.1] 01/09/2014  . Alcohol dependence ([place]) [F10.20] 01/08/2014    Total Time spent with patient: 30 minutes  Musculoskeletal: Strength & Muscle Tone: within normal limits Gait & Station: normal Patient leans: N/A  Psychiatric Specialty Exam: Review of Systems  Psychiatric/Behavioral: Positive for substance abuse. Negative for depression, suicidal ideas and hallucinations. The patient is not nervous/anxious.   All other systems reviewed and are negative.   Blood pressure 84/57, pulse 91, temperature 97.8 F (36.6 C), temperature source Oral, resp. rate 16, height 6\' 1"  (1.854 m), weight 97.523 kg (215 lb).Body mass index is 28.37 kg/(m^2).  General  Appearance: Casual  Eye Contact::  Fair  Speech:  Clear and [id number]  Volume:  Normal  Mood:  Euthymic  Affect:  Appropriate  Thought Process:  Coherent  Orientation:  Full (Time, Place, and Person)  Thought Content:  WDL  Suicidal Thoughts:  No  Homicidal Thoughts:  No  Memory:  Immediate;   Fair Recent;   Fair Remote;   Fair  Judgement:  Fair  Insight:  Fair  Psychomotor Activity:  Normal  Concentration:  Fair  Recall:  [employer] of Knowledge:Fair  Language: Fair  Akathisia:  No  Handed:  Right  AIMS (if indicated):     Assets:  Desire for Improvement  Sleep:  Number of Hours: 5.75  Cognition: WNL  ADL's:  Intact   Mental Status Per Nursing Assessment::   On Admission:  Suicidal ideation indicated by patient, Suicide plan, Plan includes specific time, place, or method, Self-harm thoughts, Self-harm behaviors, Intention to act on suicide plan, Belief that plan would result in death  Demographic Factors:  Male  Loss Factors: NA  Historical Factors: Impulsivity  Risk Reduction Factors:   Positive social support  Continued Clinical Symptoms:  Alcohol/Substance Abuse/Dependencies Previous Psychiatric Diagnoses and Treatments  Cognitive Features That Contribute To Risk:  None    Suicide Risk:  Minimal: No identifiable suicidal ideation.  Patients presenting with no risk factors but with morbid ruminations; may be classified as minimal risk based on the severity of the depressive symptoms  Follow-up Information    Follow up with [facility].   Why:  Referral sent 5/17. If you are interested in program, please contact admissions office (Melissa) to check status of referral/waitlist. Thank you.    Contact information:   [place], [place]  Phone: [phone] Fax: [phone]      Follow up with  [facility] On 05/27/2015.   Why:  Screening for possible admission on this date at 8:00AM. Please bring proof of [facility]  residency/photo ID to this screening. Thank you.    Contact information:   [address] [place], [place] [address] Phone: [phone] Fax: [phone]      Follow up with [facility].   Why:  Walk in between 8am-9am Monday through Friday for hospital follow-up/medication management/assessment for counseling services.    Contact information:   [address], [place] [address] Phone: [phone] Fax: [phone]      Plan Of Care/Follow-up recommendations:  Activity:  NO restrictions Diet:  regular Tests:  as needed Other:  follow up with after care  Galaxy Borden, MD 05/26/2015, 3:20 PM

## 2015-05-26 NOTE — BHH Group Notes (Signed)
[facility] Group Notes:  (Clinical Social Work)  05/26/2015  10:00-11:00AM  Summary of Progress/Problems:   The main focus of today's process group was to   1)   Identify the patient's current healthy supports   2)  Discuss self as a healthy and unhealthy support  3)   Listen to two songs/discuss inspiration to believe in something, forgive oneself and accept help from others  The patient expressed full comprehension of the concepts presented, and agreed that there is a need to add more supports.  The patient stated his kids, friends, and Alcoholics Anonymous contacts are major supports, and [facility] will be for the next 30 days.  He does not have someplace to follow for medications, so was informed he may be taken by [facility] to [facility] for those.  Type of Therapy:  Process Group with Motivational Interviewing  Participation Level:  Active  Participation Quality:  Attentive and Sharing  Affect:  Blunted  Cognitive:  Appropriate  Insight:  Developing/Improving  Engagement in Therapy:  Engaged  Modes of Intervention:   Education, Support and Processing, Activity  Chad Dominion, LCSW 05/26/2015

## 2015-05-26 NOTE — Progress Notes (Signed)
Patient ID: Chad Morgan, male   DOB: 1980/02/19, 35 y.o.   MRN: [id number] [facility] MD Progress Note  05/26/2015 12:50 PM [name]  MRN:  [id number]  Subjective:  "The Minipress helped last night with the nightmares" Patient seen by this provider and chart reviewed.  On evaluation:  Chad Morgan reports that he slept better last night related to the Minipress helping with the nightmares.  Reports that he is tolerating medications without adverse reaction; he is attending/participating in group sessions; and eating without difficulty.  At this time he denies suicidal/homicidal ideation, psychosis, and paranoia.  States that he is ready to go to rehab.     Principal Problem: Severe episode of recurrent major depressive disorder, without psychotic features ([name]) Diagnosis:   Patient Active Problem List   Diagnosis Date Noted  . Severe episode of recurrent major depressive disorder, without psychotic features ([name]) [F33.2]   . Alcohol use disorder, severe, dependence ([name]) [F10.20] 05/20/2015  . Alcohol dependence with withdrawal with complication ([name]) 123456   . Major depressive disorder, recurrent, severe without psychotic features ([name]) [F33.2]   . PTSD (post-traumatic stress disorder) [F43.10] 04/25/2014  . Severe major depression without psychotic features ([name]) [F32.2] 04/24/2014  . Suicidal ideations [R45.851]   . Alcohol dependence with withdrawal, uncomplicated ([name]) [id number] 04/23/2014  . Alcohol dependence with intoxication with complication ([name]) 0000000 04/23/2014  . Tobacco abuse [Z72.0] 04/16/2014  . Alcohol withdrawal ([name]) [F10.239] 04/16/2014  . Alcohol dependence with uncomplicated withdrawal ([name]) [F10.230]   . Major depressive disorder, recurrent episode, moderate ([name]) [F33.1]   . Social anxiety disorder [F40.10] 01/09/2014  . Panic attacks [F41.0] 01/09/2014  . GAD (generalized anxiety disorder) [F41.1] 01/09/2014  . Alcohol dependence ([name]) [F10.20]  01/08/2014   Total Time spent with patient: 15 minutes  Past Psychiatric History: Multiple hospitalizations at this hospital  Past Medical History:  Past Medical History  Diagnosis Date  . Anxiety   . Depression   . Alcohol abuse   . PTSD (post-traumatic stress disorder)   . Seizures ([name])     with withdrawal    Past Surgical History  Procedure Laterality Date  . Wisdom tooth extraction     Family History:  Family History  Problem Relation Age of Onset  . Alcoholism Father    Family Psychiatric  History: As above  Social History:  History  Alcohol Use  . Yes    Comment:  1/2 gallon of liquor daily     History  Drug Use  . Yes  . Special: Benzodiazepines, Marijuana    Comment: Patient denies     Social History   Social History  . Marital Status: Legally Separated    Spouse Name: N/A  . Number of Children: N/A  . Years of Education: N/A   Social History Main Topics  . Smoking status: Current Every Day Smoker -- 1.00 packs/day for 16 years    Types: Cigarettes  . Smokeless tobacco: Never Used  . Alcohol Use: Yes     Comment:  1/2 gallon of liquor daily  . Drug Use: Yes    Special: Benzodiazepines, Marijuana     Comment: Patient denies   . Sexual Activity: Yes   Other Topics Concern  . None   Social History Narrative   Additional Social History:   Sleep: Good  Appetite:  Good  Current Medications: Current Facility-Administered Medications  Medication Dose Route Frequency Provider Last Rate Last Dose  . acamprosate (CAMPRAL) tablet 666 mg  666 mg Oral TID WC Kerrie Buffalo, NP   666 mg at 05/26/15 1205  . acetaminophen (TYLENOL) tablet 650 mg  650 mg Oral Q6H PRN Niel Hummer, NP   650 mg at 05/25/15 [id number]  . alum & mag hydroxide-simeth (MAALOX/MYLANTA) 200-200-20 MG/5ML suspension 30 mL  30 mL Oral Q4H PRN Niel Hummer, NP      . feeding supplement (ENSURE ENLIVE) (ENSURE ENLIVE) liquid 237 mL  237 mL Oral Q24H Nicholaus Bloom, MD   237 mL at  05/25/15 1445  . gabapentin (NEURONTIN) capsule 600 mg  600 mg Oral TID WC & HS Encarnacion Slates, NP   600 mg at 05/26/15 1205  . magnesium hydroxide (MILK OF MAGNESIA) suspension 30 mL  30 mL Oral Daily PRN Niel Hummer, NP      . multivitamin with minerals tablet 1 tablet  1 tablet Oral Daily Niel Hummer, NP   1 tablet at 05/26/15 [phone]  . nicotine (NICODERM CQ - dosed in mg/24 hours) patch 21 mg  21 mg Transdermal Daily Nicholaus Bloom, MD   21 mg at 05/26/15 0755  . prazosin (MINIPRESS) capsule 1 mg  1 mg Oral QHS Nanci Pina, FNP   1 mg at 05/25/15 2117  . sertraline (ZOLOFT) tablet 25 mg  25 mg Oral Daily Himabindu Ravi, MD   25 mg at 05/26/15 0752  . thiamine (VITAMIN B-1) tablet 100 mg  100 mg Oral Daily Niel Hummer, NP   100 mg at 05/26/15 [id number]  . traZODone (DESYREL) tablet 150 mg  150 mg Oral QHS PRN Encarnacion Slates, NP   150 mg at 05/25/15 2118   Lab Results: No results found for this or any previous visit (from the past 48 hour(s)).  Blood Alcohol level:  Lab Results  Component Value Date   ETH 124* 05/19/2015   ETH 204* 05/04/2014   Physical Findings: AIMS: Facial and Oral Movements Muscles of Facial Expression: None, normal Lips and Perioral Area: None, normal Jaw: None, normal Tongue: None, normal,Extremity Movements Upper (arms, wrists, hands, fingers): None, normal Lower (legs, knees, ankles, toes): None, normal, Trunk Movements Neck, shoulders, hips: None, normal, Overall Severity Severity of abnormal movements (highest score from questions above): None, normal Incapacitation due to abnormal movements: None, normal Patient's awareness of abnormal movements (rate only patient's report): No Awareness, Dental Status Current problems with teeth and/or dentures?: No Does patient usually wear dentures?: No  CIWA:  CIWA-Ar Total: 1 COWS:  COWS Total Score: 5  Musculoskeletal: Strength & Muscle Tone: within normal limits Gait & Station: normal Patient leans:  N/A  Psychiatric Specialty Exam: Review of Systems  Psychiatric/Behavioral: Positive for depression and substance abuse. Suicidal ideas: Denies.       Reports that the minipress helped with nightmares.  States that he slept better last night     Blood pressure 84/57, pulse 91, temperature 97.8 F (36.6 C), temperature source Oral, resp. rate 16, height 6\' 1"  (1.854 m), weight 97.523 kg (215 lb).Body mass index is 28.37 kg/(m^2).  General Appearance: Casual  Eye Contact::  Good  Speech:  Clear and Coherent and Normal Rate  Volume:  Normal  Mood:  "Good"  Affect:  Congruent  Thought Process:  Circumstantial and Coherent  Orientation:  Full (Time, Place, and Person)  Thought Content:  WDL  Suicidal Thoughts:  No  Homicidal Thoughts:  No  Memory:  Immediate;   Good Recent;   Good Remote;  Good  Judgement:  Fair  Insight:  Present  Psychomotor Activity:  Normal  Concentration:  Fair  Recall:  Good  Fund of Knowledge:Good  Language: Good  Akathisia:  No  Handed:  Right  AIMS (if indicated):     Assets:  Communication Skills Desire for Improvement  ADL's:  Intact  Cognition: WNL  Sleep:  Number of Hours: 5.75   Treatment Plan Summary: Daily contact with patient to assess and evaluate symptoms and progress in treatment and Medication management : Alcohol dependence: continue the alcohol detox protocols. Alcoholism: Continue Campral 66 mg tid Attend group therapy and AA meetings on unit to learn coping skills to maintain sobriety after discharge. Mood disorder: Continue the Zoloft at 25 mg daily and plan to titrate as needed. Agitation: Continue the Gabapentin to 600 mg 4 times daily. Insomnia: Continue the Trazodone 150 mg. Dreams/Nightmares: Will add Minipress 1mg  po qhs.  Suicidal ideations: Patient to develop action alternatives to suicidal thoughts Attend groups and develop coping skills to deal with emotional dysregulation. Social worker to work on discharge  disposition & follow-up care, possible discharge Monday 05-27-15 to the [facility].  Will discharge early tomorrow morning for [facility] if no significant changes.  Will need 2 weeks of medication.  Continue current treatment plan; no changes at this time  Monte Bronder, Delphia Grates, NP,  05/26/2015, 12:50 PM

## 2015-05-27 NOTE — Plan of Care (Signed)
Problem: Education: Goal: Understanding of discharge needs will improve Outcome: Progressing Pt has attended [place] groups and is discharging in AM to [facility] to continue treatment

## 2015-05-27 NOTE — Progress Notes (Signed)
Discharge note:  Pt given all discharge paperwork, prescriptions and samples.  Pt denied suicidal ideation, homicidal ideation, and AVH at this time.  Pt was escorted to search room where he collected his belongings.  Pt did not voice any questions or concerns.  Pt was escorted to lobby to await taxi and given offer of breakfast and drink of his choice.  No concerns voiced.

## 2015-06-05 DIAGNOSIS — R45851 Suicidal ideations: Secondary | ICD-10-CM

## 2015-06-06 DIAGNOSIS — F329 Major depressive disorder, single episode, unspecified: Secondary | ICD-10-CM | POA: Diagnosis present

## 2017-03-18 ENCOUNTER — Emergency Department (HOSPITAL_BASED_OUTPATIENT_CLINIC_OR_DEPARTMENT_OTHER)
Admission: EM | Admit: 2017-03-18 | Discharge: 2017-03-19 | Disposition: A | Discharge status: Other Acute Inpatient Hospital | Payer: Self-pay | Attending: Emergency Medicine | Admitting: Emergency Medicine

## 2017-03-18 ENCOUNTER — Other Ambulatory Visit: Payer: Self-pay

## 2017-03-18 ENCOUNTER — Encounter (HOSPITAL_BASED_OUTPATIENT_CLINIC_OR_DEPARTMENT_OTHER): Payer: Self-pay

## 2017-03-18 DIAGNOSIS — F17210 Nicotine dependence, cigarettes, uncomplicated: Secondary | ICD-10-CM | POA: Insufficient documentation

## 2017-03-18 DIAGNOSIS — F332 Major depressive disorder, recurrent severe without psychotic features: Secondary | ICD-10-CM

## 2017-03-18 DIAGNOSIS — R7401 Elevation of levels of liver transaminase levels: Secondary | ICD-10-CM

## 2017-03-18 DIAGNOSIS — R740 Nonspecific elevation of levels of transaminase and lactic acid dehydrogenase [LDH]: Secondary | ICD-10-CM | POA: Insufficient documentation

## 2017-03-18 DIAGNOSIS — E876 Hypokalemia: Secondary | ICD-10-CM

## 2017-03-18 DIAGNOSIS — R45851 Suicidal ideations: Secondary | ICD-10-CM

## 2017-03-18 NOTE — ED Triage Notes (Signed)
Pt is intoxicated states he is having thoughts of hurting himself, has had these thoughts for 8 years, states he wants to drink himself to death, went through a breakup tonight and is not coping well

## 2017-03-19 ENCOUNTER — Encounter (HOSPITAL_BASED_OUTPATIENT_CLINIC_OR_DEPARTMENT_OTHER): Payer: Self-pay | Admitting: Behavioral Health

## 2017-03-19 ENCOUNTER — Inpatient Hospital Stay (HOSPITAL_COMMUNITY)
Admission: AD | Admit: 2017-03-19 | Discharge: 2017-03-23 | DRG: 885 | Disposition: A | Discharge status: Home / Self Care | Payer: Federal, State, Local not specified - Other | Source: Intra-hospital | Attending: Psychiatry | Admitting: Psychiatry

## 2017-03-19 DIAGNOSIS — F17210 Nicotine dependence, cigarettes, uncomplicated: Secondary | ICD-10-CM | POA: Diagnosis present

## 2017-03-19 DIAGNOSIS — F4310 Post-traumatic stress disorder, unspecified: Secondary | ICD-10-CM | POA: Diagnosis present

## 2017-03-19 DIAGNOSIS — F4010 Social phobia, unspecified: Secondary | ICD-10-CM | POA: Diagnosis present

## 2017-03-19 DIAGNOSIS — Z811 Family history of alcohol abuse and dependence: Secondary | ICD-10-CM | POA: Diagnosis not present

## 2017-03-19 DIAGNOSIS — R45851 Suicidal ideations: Secondary | ICD-10-CM | POA: Diagnosis present

## 2017-03-19 DIAGNOSIS — G479 Sleep disorder, unspecified: Secondary | ICD-10-CM | POA: Diagnosis present

## 2017-03-19 DIAGNOSIS — F1010 Alcohol abuse, uncomplicated: Secondary | ICD-10-CM | POA: Diagnosis not present

## 2017-03-19 DIAGNOSIS — R569 Unspecified convulsions: Secondary | ICD-10-CM | POA: Diagnosis present

## 2017-03-19 DIAGNOSIS — F1310 Sedative, hypnotic or anxiolytic abuse, uncomplicated: Secondary | ICD-10-CM | POA: Diagnosis not present

## 2017-03-19 DIAGNOSIS — G4700 Insomnia, unspecified: Secondary | ICD-10-CM | POA: Diagnosis present

## 2017-03-19 DIAGNOSIS — F332 Major depressive disorder, recurrent severe without psychotic features: Principal | ICD-10-CM | POA: Diagnosis present

## 2017-03-19 DIAGNOSIS — F10230 Alcohol dependence with withdrawal, uncomplicated: Secondary | ICD-10-CM | POA: Diagnosis present

## 2017-03-19 DIAGNOSIS — F10229 Alcohol dependence with intoxication, unspecified: Secondary | ICD-10-CM | POA: Diagnosis present

## 2017-03-19 DIAGNOSIS — F322 Major depressive disorder, single episode, severe without psychotic features: Secondary | ICD-10-CM | POA: Diagnosis present

## 2017-03-19 DIAGNOSIS — F1210 Cannabis abuse, uncomplicated: Secondary | ICD-10-CM | POA: Diagnosis not present

## 2017-03-19 LAB — CBC
HCT: 42.5 % (ref 39.0–52.0)
Hemoglobin: 15.3 g/dL (ref 13.0–17.0)
MCH: 32.8 pg (ref 26.0–34.0)
MCHC: 36 g/dL (ref 30.0–36.0)
MCV: 91.2 fL (ref 78.0–100.0)
PLATELETS: 185 10*3/uL (ref 150–400)
RBC: 4.66 MIL/uL (ref 4.22–5.81)
RDW: 13.3 % (ref 11.5–15.5)
WBC: 10.4 10*3/uL (ref 4.0–10.5)

## 2017-03-19 LAB — COMPREHENSIVE METABOLIC PANEL
ALK PHOS: 51 U/L (ref 38–126)
ALT: 179 U/L — AB (ref 17–63)
AST: 265 U/L — ABNORMAL HIGH (ref 15–41)
Albumin: 4.4 g/dL (ref 3.5–5.0)
Anion gap: 9 (ref 5–15)
BILIRUBIN TOTAL: 0.4 mg/dL (ref 0.3–1.2)
BUN: 8 mg/dL (ref 6–20)
CALCIUM: 8.3 mg/dL — AB (ref 8.9–10.3)
CO2: 30 mmol/L (ref 22–32)
CREATININE: 0.65 mg/dL (ref 0.61–1.24)
Chloride: 101 mmol/L (ref 101–111)
GFR calc Af Amer: >60 mL/min (ref >60)
GFR calc non Af Amer: >60 mL/min (ref >60)
Glucose, Bld: 100 mg/dL — ABNORMAL HIGH (ref 65–99)
Potassium: 3 mmol/L — ABNORMAL LOW (ref 3.5–5.1)
SODIUM: 140 mmol/L (ref 135–145)
TOTAL PROTEIN: 7.4 g/dL (ref 6.5–8.1)

## 2017-03-19 LAB — RAPID URINE DRUG SCREEN, HOSP PERFORMED
Amphetamines: NOT DETECTED
Barbiturates: NOT DETECTED
Benzodiazepines: NOT DETECTED
Cocaine: NOT DETECTED
OPIATES: NOT DETECTED
Tetrahydrocannabinol: POSITIVE — AB

## 2017-03-19 LAB — ETHANOL: Alcohol, Ethyl (B): 370 mg/dL (ref <10)

## 2017-03-19 LAB — MAGNESIUM: MAGNESIUM: 2.1 mg/dL (ref 1.7–2.4)

## 2017-03-19 MED ORDER — VITAMIN B-1 100 MG PO TABS
100.0000 mg | ORAL_TABLET | Freq: Every day | ORAL | Status: DC
  Administered 2017-03-20 – 2017-03-23 (×4): 100 mg via ORAL
  Filled 2017-03-19 (×7): qty 1

## 2017-03-19 MED ORDER — POTASSIUM CHLORIDE CRYS ER 20 MEQ PO TBCR
40.0000 meq | EXTENDED_RELEASE_TABLET | Freq: Once | ORAL | Status: AC
  Administered 2017-03-19: 40 meq via ORAL
  Filled 2017-03-19: qty 2

## 2017-03-19 MED ORDER — LORAZEPAM 1 MG PO TABS: 0.0000 mg | ORAL_TABLET | Freq: Two times a day (BID) | ORAL | Status: DC

## 2017-03-19 MED ORDER — IBUPROFEN 400 MG PO TABS: 600.0000 mg | ORAL_TABLET | Freq: Three times a day (TID) | ORAL | Status: DC | PRN

## 2017-03-19 MED ORDER — LORAZEPAM 2 MG/ML IJ SOLN
0.0000 mg | Freq: Four times a day (QID) | INTRAMUSCULAR | Status: DC
  Administered 2017-03-19: 2 mg via INTRAVENOUS
  Filled 2017-03-19: qty 1

## 2017-03-19 MED ORDER — THIAMINE HCL 100 MG/ML IJ SOLN
100.0000 mg | Freq: Once | INTRAMUSCULAR | Status: AC
  Administered 2017-03-20: 100 mg via INTRAMUSCULAR
  Filled 2017-03-19: qty 2

## 2017-03-19 MED ORDER — ALUM & MAG HYDROXIDE-SIMETH 200-200-20 MG/5ML PO SUSP: 30.0000 mL | Freq: Four times a day (QID) | ORAL | Status: DC | PRN

## 2017-03-19 MED ORDER — LORAZEPAM 1 MG PO TABS
1.0000 mg | ORAL_TABLET | Freq: Three times a day (TID) | ORAL | Status: AC
  Administered 2017-03-21 – 2017-03-22 (×3): 1 mg via ORAL
  Filled 2017-03-19 (×3): qty 1

## 2017-03-19 MED ORDER — VITAMIN B-1 100 MG PO TABS: 100.0000 mg | ORAL_TABLET | Freq: Every day | ORAL | Status: DC

## 2017-03-19 MED ORDER — ONDANSETRON 4 MG PO TBDP
4.0000 mg | ORAL_TABLET | Freq: Four times a day (QID) | ORAL | Status: AC | PRN
  Administered 2017-03-19 – 2017-03-20 (×3): 4 mg via ORAL
  Filled 2017-03-19 (×2): qty 1

## 2017-03-19 MED ORDER — LORAZEPAM 1 MG PO TABS: 1.0000 mg | ORAL_TABLET | Freq: Every day | ORAL | Status: DC

## 2017-03-19 MED ORDER — LORAZEPAM 2 MG/ML IJ SOLN: 0.0000 mg | Freq: Two times a day (BID) | INTRAMUSCULAR | Status: DC

## 2017-03-19 MED ORDER — NICOTINE 21 MG/24HR TD PT24
21.0000 mg | MEDICATED_PATCH | Freq: Every day | TRANSDERMAL | Status: DC
  Administered 2017-03-19: 21 mg via TRANSDERMAL
  Filled 2017-03-19: qty 1

## 2017-03-19 MED ORDER — ZOLPIDEM TARTRATE 5 MG PO TABS
5.0000 mg | ORAL_TABLET | Freq: Every evening | ORAL | Status: DC | PRN
  Filled 2017-03-19: qty 1

## 2017-03-19 MED ORDER — LORAZEPAM 1 MG PO TABS
1.0000 mg | ORAL_TABLET | Freq: Four times a day (QID) | ORAL | Status: AC
  Administered 2017-03-19 – 2017-03-21 (×6): 1 mg via ORAL
  Filled 2017-03-19 (×5): qty 1

## 2017-03-19 MED ORDER — HYDROXYZINE HCL 25 MG PO TABS
ORAL_TABLET | ORAL | Status: AC
  Administered 2017-03-19: 25 mg via ORAL
  Filled 2017-03-19: qty 1

## 2017-03-19 MED ORDER — ONDANSETRON 4 MG PO TBDP
ORAL_TABLET | ORAL | Status: AC
  Administered 2017-03-19: 4 mg via ORAL
  Filled 2017-03-19: qty 1

## 2017-03-19 MED ORDER — LORAZEPAM 1 MG PO TABS
1.0000 mg | ORAL_TABLET | Freq: Two times a day (BID) | ORAL | Status: AC
  Administered 2017-03-22 – 2017-03-23 (×2): 1 mg via ORAL
  Filled 2017-03-19 (×2): qty 1

## 2017-03-19 MED ORDER — LORAZEPAM 1 MG PO TABS
0.0000 mg | ORAL_TABLET | Freq: Four times a day (QID) | ORAL | Status: DC
  Administered 2017-03-19 (×3): 2 mg via ORAL
  Filled 2017-03-19 (×3): qty 2

## 2017-03-19 MED ORDER — LOPERAMIDE HCL 2 MG PO CAPS: 2.0000 mg | ORAL_CAPSULE | ORAL | Status: AC | PRN

## 2017-03-19 MED ORDER — ONDANSETRON HCL 8 MG PO TABS
4.0000 mg | ORAL_TABLET | Freq: Three times a day (TID) | ORAL | Status: DC | PRN
  Administered 2017-03-19 (×2): 4 mg via ORAL
  Filled 2017-03-19 (×2): qty 1

## 2017-03-19 MED ORDER — LORAZEPAM 1 MG PO TABS
ORAL_TABLET | ORAL | Status: AC
  Administered 2017-03-19: 1 mg via ORAL
  Filled 2017-03-19: qty 2

## 2017-03-19 MED ORDER — LORAZEPAM 1 MG PO TABS
1.0000 mg | ORAL_TABLET | Freq: Four times a day (QID) | ORAL | Status: AC | PRN
  Administered 2017-03-19 – 2017-03-21 (×3): 1 mg via ORAL
  Filled 2017-03-19 (×2): qty 1

## 2017-03-19 MED ORDER — HYDROXYZINE HCL 25 MG PO TABS
25.0000 mg | ORAL_TABLET | Freq: Four times a day (QID) | ORAL | Status: AC | PRN
  Administered 2017-03-19 – 2017-03-22 (×4): 25 mg via ORAL
  Filled 2017-03-19 (×3): qty 1

## 2017-03-19 MED ORDER — ADULT MULTIVITAMIN W/MINERALS CH
1.0000 | ORAL_TABLET | Freq: Every day | ORAL | Status: DC
  Administered 2017-03-20 – 2017-03-23 (×4): 1 via ORAL
  Filled 2017-03-19 (×7): qty 1

## 2017-03-19 MED ORDER — THIAMINE HCL 100 MG/ML IJ SOLN: 100.0000 mg | Freq: Every day | INTRAMUSCULAR | Status: DC

## 2017-03-19 NOTE — ED Provider Notes (Signed)
[facility] EMERGENCY DEPARTMENT Provider Note   CSN: [id number] Arrival date & time: 03/18/17  2326     History   Chief Complaint Chief Complaint  Patient presents with  . Alcohol Intoxication  . Suicidal    HPI Chad Morgan is a 37 y.o. male.  The history is provided by the patient.  He has a history alcohol abuse, alcohol withdrawal seizures, depression, posttraumatic stress disorder and comes in stating that he has been having suicidal thoughts for the past month.  He states he wants to drink himself to death.  He has been drinking about half a gallon of tequila a day.  He denies homicidal ideation.  He has had crying spells and anhedonia but denies early morning wakening.  He denies hallucinations.  He does admit to marijuana use is but denies other drug use.  Past Medical History:  Diagnosis Date  . Alcohol abuse   . Anxiety   . Depression   . PTSD (post-traumatic stress disorder)   . Seizures ([place])    with withdrawal    Patient Active Problem List   Diagnosis Date Noted  . Severe episode of recurrent major depressive disorder, without psychotic features ([place])   . Alcohol use disorder, severe, dependence ([place]) 05/20/2015  . Alcohol dependence with withdrawal with complication ([place])   . Major depressive disorder, recurrent, severe without psychotic features ([place])   . PTSD (post-traumatic stress disorder) 04/25/2014  . Severe major depression without psychotic features ([place]) 04/24/2014  . Suicidal ideations   . Alcohol dependence with withdrawal, uncomplicated ([place]) 79/89/2119  . Alcohol dependence with intoxication with complication ([place]) 41/74/0814  . Tobacco abuse 04/16/2014  . Alcohol withdrawal ([place]) 04/16/2014  . Alcohol dependence with uncomplicated withdrawal ([place])   . Major depressive disorder, recurrent episode, moderate ([place])   . Social anxiety disorder 01/09/2014  . Panic attacks 01/09/2014  . GAD (generalized anxiety disorder) 01/09/2014   . Alcohol dependence ([place]) 01/08/2014    Past Surgical History:  Procedure Laterality Date  . WISDOM TOOTH EXTRACTION         Home Medications    Prior to Admission medications   Medication Sig Start Date End Date Taking? Authorizing Provider  acamprosate (CAMPRAL) 333 MG tablet Take 2 tablets (666 mg total) by mouth 3 (three) times daily with meals. Patient not taking: Reported on 03/18/2017 05/26/15   Niel Hummer, NP  gabapentin (NEURONTIN) 300 MG capsule Take 2 capsules (600 mg total) by mouth 4 (four) times daily -  with meals and at bedtime. Patient not taking: Reported on 03/18/2017 05/26/15   Niel Hummer, NP  Multiple Vitamin (MULTIVITAMIN WITH MINERALS) TABS tablet Take 1 tablet by mouth daily. Patient not taking: Reported on 03/18/2017 05/26/15   Niel Hummer, NP  nicotine (NICODERM CQ - DOSED IN MG/24 HOURS) 21 mg/24hr patch Place 1 patch (21 mg total) onto the skin daily. Patient not taking: Reported on 03/18/2017 05/26/15   Niel Hummer, NP  prazosin (MINIPRESS) 1 MG capsule Take 1 capsule (1 mg total) by mouth at bedtime. Patient not taking: Reported on 03/18/2017 05/26/15   Niel Hummer, NP  sertraline (ZOLOFT) 25 MG tablet Take 1 tablet (25 mg total) by mouth daily. Patient not taking: Reported on 03/18/2017 05/26/15   Niel Hummer, NP  thiamine 100 MG tablet Take 1 tablet (100 mg total) by mouth daily. For low thiamine Patient not taking: Reported on 03/18/2017 05/08/14   Encarnacion Slates, NP  thiamine 100 MG tablet Take 1 tablet (100 mg total) by mouth daily. Patient not taking: Reported on 03/18/2017 05/26/15   Niel Hummer, NP  traZODone (DESYREL) 100 MG tablet Take 1 tablet (100 mg total) by mouth at bedtime. For sleep Patient not taking: Reported on 03/18/2017 05/08/14   Lindell Spar I, NP  traZODone (DESYREL) 150 MG tablet Take 1 tablet (150 mg total) by mouth at bedtime as needed for sleep. Patient not taking: Reported on 03/18/2017 05/26/15   Niel Hummer, NP     Family History Family History  Problem Relation Age of Onset  . Alcoholism Father     Social History Social History   Tobacco Use  . Smoking status: Current Every Day Smoker    Packs/day: 1.00    Years: 16.00    Pack years: 16.00    Types: Cigarettes  . Smokeless tobacco: Never Used  Substance Use Topics  . Alcohol use: Yes    Comment:  1/2 gallon of liquor daily  . Drug use: Yes    Types: Benzodiazepines, Marijuana    Comment: Patient denies      Allergies   Patient has no known allergies.   Review of Systems Review of Systems  All other systems reviewed and are negative.    Physical Exam Updated Vital Signs BP 133/87   Pulse (!) 101   Temp 97.9 F (36.6 C) (Oral)   Resp 18   Ht 6\' 2"  (1.88 m)   Wt 81.6 kg (180 lb)   SpO2 100%   BMI 23.11 kg/m   Physical Exam  Nursing note and vitals reviewed.  37 year old male, resting comfortably and in no acute distress. Vital signs are normal. Oxygen saturation is 100%, which is normal. Head is normocephalic and atraumatic. PERRLA, EOMI. Oropharynx is clear. Neck is nontender and supple without adenopathy or JVD. Back is nontender and there is no CVA tenderness. Lungs are clear without rales, wheezes, or rhonchi. Chest is nontender. Heart has regular rate and rhythm without murmur. Abdomen is soft, flat, nontender without masses or hepatosplenomegaly and peristalsis is normoactive. Extremities have no cyanosis or edema, full range of motion is present. Skin is warm and dry without rash. Neurologic: Mental status is normal, cranial nerves are intact, there are no motor or sensory deficits.  ED Treatments / Results  Labs (all labs ordered are listed, but only abnormal results are displayed) Labs Reviewed  COMPREHENSIVE METABOLIC PANEL - Abnormal; Notable for the following components:      Result Value   Potassium 3.0 (*)    Glucose, Bld 100 (*)    Calcium 8.3 (*)    AST 265 (*)    ALT 179 (*)    All  other components within normal limits  ETHANOL - Abnormal; Notable for the following components:   Alcohol, Ethyl (B) 370 (*)    All other components within normal limits  RAPID URINE DRUG SCREEN, HOSP PERFORMED - Abnormal; Notable for the following components:   Tetrahydrocannabinol POSITIVE (*)    All other components within normal limits  CBC  MAGNESIUM   Procedures Procedures (including critical care time)  Medications Ordered in ED Medications  potassium chloride SA (K-DUR,KLOR-CON) CR tablet 40 mEq (40 mEq Oral Given 03/19/17 0210)     Initial Impression / Assessment and Plan / ED Course  I have reviewed the triage vital signs and the nursing notes.  Pertinent labs & imaging results that were available during my care of  the patient were reviewed by me and considered in my medical decision making (see chart for details).  Major depression with suicidal ideation.  Laboratory workup is significant for ethanol level of 370, elevated transaminases presumably secondary to alcohol abuse, hypokalemia which is probably nutritional.  Drug screen positive for [facility] which is consistent with his history.  Old records are reviewed, and he does have several prior hospitalizations for depression and suicidal ideation.  TTS consultation has been completed, but they feel he is too intoxicated to make an accurate assessment and are reevaluating him in several hours.  Final Clinical Impressions(s) / ED Diagnoses   Final diagnoses:  None    ED Discharge Orders    None       Delora Fuel, MD 09/81/19 [phone]

## 2017-03-19 NOTE — ED Notes (Signed)
Date and time results received: 03/19/17 0058 (use smartphrase ".now" to insert current time)  Test: ETOH Critical Value: 370  Name of Provider Notified: Dr. Roxanne Mins  Orders Received? Or Actions Taken?: no new orders

## 2017-03-19 NOTE — BH Assessment (Signed)
Pt continues to endorse SI with plans to drink himself to death.  Pt denies HI/AVH.    Per Earleen Newport, NP, patient meets inpt hospitalization.
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
## 2017-03-19 NOTE — Progress Notes (Signed)
Pt accepted to [facility]. Bed 304-1  Shuvon Rankin, NP is the accepting provider.  Dr. Neita Garnet is the attending provider.  Call report to [phone]  RN@ [facility]    Pt is Voluntary.  Pt may be transported by [facility]  Pt scheduled  to arrive at [place] at 9:30 PM   Romie Minus T. Judi Cong, MSW, [place] Disposition Clinical Social Work [phone] (cell) [phone] (office)

## 2017-03-19 NOTE — ED Notes (Signed)
[facility] called for transport to [facility] will arrive between 2100 and 2130 tonight.

## 2017-03-19 NOTE — ED Notes (Signed)
TTS evaluating pt at this time.

## 2017-03-20 ENCOUNTER — Encounter (HOSPITAL_COMMUNITY): Payer: Self-pay

## 2017-03-20 DIAGNOSIS — R45851 Suicidal ideations: Secondary | ICD-10-CM

## 2017-03-20 DIAGNOSIS — F419 Anxiety disorder, unspecified: Secondary | ICD-10-CM

## 2017-03-20 DIAGNOSIS — Z811 Family history of alcohol abuse and dependence: Secondary | ICD-10-CM

## 2017-03-20 DIAGNOSIS — F1010 Alcohol abuse, uncomplicated: Secondary | ICD-10-CM

## 2017-03-20 DIAGNOSIS — G4700 Insomnia, unspecified: Secondary | ICD-10-CM

## 2017-03-20 DIAGNOSIS — F332 Major depressive disorder, recurrent severe without psychotic features: Principal | ICD-10-CM

## 2017-03-20 DIAGNOSIS — F17210 Nicotine dependence, cigarettes, uncomplicated: Secondary | ICD-10-CM

## 2017-03-20 MED ORDER — FLUOXETINE HCL 10 MG PO CAPS
10.0000 mg | ORAL_CAPSULE | Freq: Every day | ORAL | Status: DC
  Administered 2017-03-20: 10 mg via ORAL
  Filled 2017-03-20 (×3): qty 1

## 2017-03-20 MED ORDER — TRAZODONE HCL 50 MG PO TABS
50.0000 mg | ORAL_TABLET | Freq: Every evening | ORAL | Status: DC | PRN
  Administered 2017-03-20 – 2017-03-22 (×3): 50 mg via ORAL
  Filled 2017-03-20 (×2): qty 1
  Filled 2017-03-20: qty 7
  Filled 2017-03-20: qty 1

## 2017-03-20 MED ORDER — NICOTINE 21 MG/24HR TD PT24
21.0000 mg | MEDICATED_PATCH | Freq: Every day | TRANSDERMAL | Status: DC
  Administered 2017-03-20 – 2017-03-23 (×4): 21 mg via TRANSDERMAL
  Filled 2017-03-20 (×8): qty 1

## 2017-03-20 MED ORDER — NALTREXONE HCL 50 MG PO TABS
50.0000 mg | ORAL_TABLET | Freq: Every day | ORAL | Status: DC
Start: 2017-03-20 — End: 2017-03-23
  Administered 2017-03-20 – 2017-03-23 (×4): 50 mg via ORAL
  Filled 2017-03-20 (×7): qty 1

## 2017-03-20 MED ORDER — FLUOXETINE HCL 20 MG PO CAPS
20.0000 mg | ORAL_CAPSULE | Freq: Every day | ORAL | Status: DC
  Administered 2017-03-21 – 2017-03-23 (×3): 20 mg via ORAL
  Filled 2017-03-20 (×3): qty 1
  Filled 2017-03-20: qty 7
  Filled 2017-03-20 (×2): qty 1

## 2017-03-20 NOTE — BHH Group Notes (Addendum)
LCSW Group Therapy Note  03/20/2017   9:00-10:00am (300 hall)                 Type of Therapy and Topic:  Group Therapy: Anger Cues and Responses  Participation Level:  Did Not Attend   Description of Group:   In this group, patients learned how to recognize the physical, cognitive, emotional, and behavioral responses they have to anger-provoking situations.  They identified a recent time they became angry and how they reacted.  They analyzed how their reaction was possibly beneficial and how it was possibly unhelpful.  The group discussed a variety of healthier coping skills that could help with such a situation in the future.  Deep breathing was practiced briefly.  Therapeutic Goals: 1. Patients will remember their last incident of anger and how they felt emotionally and physically, what their thoughts were at the time, and how they behaved. 2. Patients will identify how their behavior at that time worked for them, as well as how it worked against them. 3. Patients will explore possible new behaviors to use in future anger situations. 4. Patients will learn that anger itself is normal and cannot be eliminated, and that healthier reactions can assist with resolving conflict rather than worsening situations.  Summary of Patient Progress:  N/A  Therapeutic Modalities:   Cognitive Behavioral Therapy  Maretta Los  03/20/2017 12:00pm

## 2017-03-20 NOTE — Progress Notes (Signed)
Patient did attend the evening speaker AA meeting.  

## 2017-03-20 NOTE — BHH Group Notes (Signed)
Identifying Needs   Date:  03/20/2017                      Time:  9:27 PM  Type of Therapy:  Nurse Education  /  The group focuses on teaching patients how to identify their needs and then how to develop the skills needed to get them met.   Participation Level:  Active  Participation Quality:  Attentive  Affect:  Appropriate  Cognitive:  Appropriate  Insight:  Improving  Engagement in Group:  Engaged  Modes of Intervention:  Education  Summary of Progress/Problems:  Lauralyn Primes 03/20/2017, 9:27 PM

## 2017-03-20 NOTE — BHH Suicide Risk Assessment (Signed)
[place] INPATIENT:  Family/Significant Other Suicide Prevention Education  Suicide Prevention Education:  Patient Refusal for Family/Significant Other Suicide Prevention Education: The patient Chad Morgan has refused to provide written consent for family/significant other to be provided Family/Significant Other Suicide Prevention Education during admission and/or prior to discharge.  Physician notified.  SUICIDE PREVENTION EDUCATION THOROUGHLY REVIEWED WITH PATIENT, WHO FELT MANY ITEMS APPLIED TO HIM.  HE RECEIVED THE BROCHURE AND AGREED TO SHARE WITH FRIENDS AS HE SEES FIT.  Berlin Hun Grossman-Orr 03/20/2017, 3:28 PM

## 2017-03-20 NOTE — Progress Notes (Signed)
Admission Note  Patient admitted to the adult unit at 2230. Patient is a 37 year old male who was Voluntary. Patient was pleasant and cooperative with staff but was reporting nausea and began to have intermittent episodes of vomiting in the search room. Patient evidently sweating with moderate tremors. BP and HR mildly elated. CIWA score 16. Patient skin search complete and was unremarkable. No contraband found. Belongings searched and items placed into locker. Patient signed for belongings. Patient denied active SI/HI/AVH and contracted for safety on the unit. Patient denied allergies to food/medicine and reported he was not currently prescribed any medications. Patient was brought onto the unit and escorted to his bed.  NP notified and patient medicated as ordered. See MAR. Patient consents to be signed when patient is able to.

## 2017-03-20 NOTE — BHH Suicide Risk Assessment (Signed)
[facility] Admission Suicide Risk Assessment   Nursing information obtained from:    Demographic factors:    Current Mental Status:    Loss Factors:    Historical Factors:    Risk Reduction Factors:     Total Time spent with patient: 45 minutes Principal Problem: Substance Induced Mood Disorder Diagnosis:   Patient Active Problem List   Diagnosis Date Noted  . MDD (major depressive disorder), severe ([place]) [F32.2] 03/19/2017  . Severe episode of recurrent major depressive disorder, without psychotic features ([place]) [F33.2]   . Alcohol use disorder, severe, dependence ([place]) [F10.20] 05/20/2015  . Alcohol dependence with withdrawal with complication ([place]) [B15.176]   . Major depressive disorder, recurrent, severe without psychotic features ([place]) [F33.2]   . PTSD (post-traumatic stress disorder) [F43.10] 04/25/2014  . Severe major depression without psychotic features ([place]) [F32.2] 04/24/2014  . Suicidal ideations [R45.851]   . Alcohol dependence with withdrawal, uncomplicated ([place]) [H60.737] 04/23/2014  . Alcohol dependence with intoxication with complication ([place]) [T06.269] 04/23/2014  . Tobacco abuse [Z72.0] 04/16/2014  . Alcohol withdrawal ([place]) [F10.239] 04/16/2014  . Alcohol dependence with uncomplicated withdrawal ([place]) [F10.230]   . Major depressive disorder, recurrent episode, moderate ([name]) [F33.1]   . Social anxiety disorder [F40.10] 01/09/2014  . Panic attacks [F41.0] 01/09/2014  . GAD (generalized anxiety disorder) [F41.1] 01/09/2014  . Alcohol dependence ([place]) [F10.20] 01/08/2014   Subjective Data:   37 y.o Caucasian male, divorced, employed. Background history of SUD. Presented to the ER intoxicated.  BAL 370 mg/dl. Reports daily pattern of drinking. Reports plans to drink self to death.  Main stressor is recent break up with his girlfriend. They amicably agreed to end their relationship because he has been drinking heavily again. Says he is paying child support for his three  kids from a previous marriage. He has not been able to see the kids since 2015. Lots of financial difficulties. Limited by felony from repeated DUI's. Routine labs significant for moderately elevated liver enzymes, low potassium and calcium. Toxicology is negative. UDS is positive for THC. Single past history of suicidal behavior. Held a gun to own head. No current assess to weapons. No family history of suicide, no evidence of psychosis. No evidence of mania. No cognitive impairment. Patient is still coming off alcohol. Patient has been started on Prozac. We discussed use of naltrexone to address cravings. Patient consented to treatment after we explored the risks and benefits.  He is cooperative with care. He has agreed to treatment recommendations. He has agreed to communicate suicidal thoughts to staff if the thoughts becomes overwhelming.    Continued Clinical Symptoms:  Alcohol Use Disorder Identification Test Final Score (AUDIT): 30 The "Alcohol Use Disorders Identification Test", Guidelines for Use in Primary Care, Second Edition.  World Pharmacologist [facility]). Score between 0-7:  no or low risk or alcohol related problems. Score between 8-15:  moderate risk of alcohol related problems. Score between 16-19:  high risk of alcohol related problems. Score 20 or above:  warrants further diagnostic evaluation for alcohol dependence and treatment.   CLINICAL FACTORS:   Depression:   Impulsivity Alcohol/Substance Abuse/Dependencies   Musculoskeletal: Strength & Muscle Tone: within normal limits Gait & Station: normal Patient leans: N/A  Psychiatric Specialty Exam: Physical Exam  Constitutional: He is oriented to person, place, and time. He appears well-developed and well-nourished.  HENT:  Head: Normocephalic and atraumatic.  Respiratory: Effort normal.  Neurological: He is alert and oriented to person, place, and time.  Psychiatric:  As above  ROS  Blood pressure 120/76,  pulse 74, temperature 98.6 F (37 C), temperature source Oral, resp. rate 16, height 6\' 2"  (1.88 m), weight 82 kg (180 lb 12.4 oz).Body mass index is 23.21 kg/m.  General Appearance: Shaky, Sweaty, not confused. Not unsteady, normal conjugate eye movements. Not internally distressed. Appropriate behavior.   Eye Contact:  Good  Speech:  Normal Rate  Volume:  Normal  Mood:  Dysphoric  Affect:  Restricted  Thought Process:  Linear  Orientation:  Full (Time, Place, and Person)  Thought Content:  Rumination No delusional theme. No preoccupation with violent thoughts. No obsession.  No hallucination in any modality.   Suicidal Thoughts:  None currently  Homicidal Thoughts:  No  Memory:  Immediate;   Good Recent;   Fair Remote;   Good  Judgement:  Fair  Insight: Good  Psychomotor Activity:  Normal  Concentration:  Concentration: Good and Attention Span: Good  Recall:  Good  Fund of Knowledge:  Good  Language:  Good  Akathisia:  Negative  Handed:    AIMS (if indicated):     Assets:  Housing Resilience Vocational/Educational  ADL's:  Intact  Cognition:  WNL  Sleep:  Number of Hours: 5      COGNITIVE FEATURES THAT CONTRIBUTE TO RISK:  None    SUICIDE RISK:   Mild:  Suicidal ideation of limited frequency, intensity, duration, and specificity.  There are no identifiable plans, no associated intent, mild dysphoria and related symptoms, good self-control (both objective and subjective assessment), few other risk factors, and identifiable protective factors, including available and accessible social support.  PLAN OF CARE:  1. Alcohol withdrawal protocol 2. Naltrexone 50 mg daily 3. Prozac 20 mg daily 4. Q 15 minute check for suicide. 5. Monitor mood, behavior and interaction with peers 6. SW would gather collateral from his family 42. Motivational enhancement   I certify that inpatient services furnished can reasonably be expected to improve the patient's condition.   [place], MD 03/20/2017, 2:08 PM

## 2017-03-20 NOTE — Progress Notes (Signed)
Writer spoke with patient before he attended [place] meeting. He reports felling much better today. He reports still having tremors and mild diarrhea but overall feeling better. He was informed of his scheduled medication for tonight. He reports that he plans to attend AA once discharged and will not have anything to do with his ex. Support given and safety maintained on unit with 15 min checks.

## 2017-03-20 NOTE — Progress Notes (Signed)
Consent forms signed.

## 2017-03-20 NOTE — Progress Notes (Signed)
Patient currently resting in bed and appears to be sleeping. Respirations are even, regular and unlabored. Patient remains on q15 minute safety checks and high fall risk precautions. Will continue to support and monitor.

## 2017-03-20 NOTE — Progress Notes (Signed)
D. Pt presents with a flat affect and anxious behavior, complaining of withdrawal symptoms (nausea, tremors, anxiety). Pt cooperative and friendly upon approach.  Pt observed in dayroom intermittently interacting with peers. Pt currently denies SI/HI and AVH and agrees to contact staff before acting on any harmful thoughts.  A. Labs and vitals monitored. Pt compliant with medications. Pt supported emotionally and encouraged to express concerns and ask questions.   R. Pt remains safe with 15 minute checks. Will continue POC.

## 2017-03-20 NOTE — Tx Team (Signed)
Initial Treatment Plan 03/19/2017 2230 MACAI SISNEROS SHF:[id number]    PATIENT STRESSORS: Marital or family conflict Substance abuse   PATIENT STRENGTHS: Ability for insight Average or above average intelligence Capable of independent living General fund of knowledge   PATIENT IDENTIFIED PROBLEMS: "get detoxed"  "have a place to go after here"  Substance Abuse  Suicide Risk  Depression             DISCHARGE CRITERIA:  Ability to meet basic life and health needs Adequate post-discharge living arrangements Improved stabilization in mood, thinking, and/or behavior Medical problems require only outpatient monitoring Motivation to continue treatment in a less acute level of care Need for constant or close observation no longer present Reduction of life-threatening or endangering symptoms to within safe limits Safe-care adequate arrangements made Verbal commitment to aftercare and medication compliance Withdrawal symptoms are absent or subacute and managed without 24-hour nursing intervention  PRELIMINARY DISCHARGE PLAN: Outpatient therapy  PATIENT/FAMILY INVOLVEMENT: This treatment plan has been presented to and reviewed with the patient, AVI KERSCHNER.  The patient and family have been given the opportunity to ask questions and make suggestions.  Annia Friendly, RN 03/20/2017, 12:52 AM
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
## 2017-03-21 DIAGNOSIS — F1020 Alcohol dependence, uncomplicated: Secondary | ICD-10-CM

## 2017-03-21 DIAGNOSIS — F1310 Sedative, hypnotic or anxiolytic abuse, uncomplicated: Secondary | ICD-10-CM

## 2017-03-21 DIAGNOSIS — R450 Nervousness: Secondary | ICD-10-CM

## 2017-03-21 DIAGNOSIS — F1210 Cannabis abuse, uncomplicated: Secondary | ICD-10-CM

## 2017-03-21 NOTE — Progress Notes (Signed)
D. Pt reports that he's feeling much better and is looking forward to discharge. Pt reports having slept well last night, and endorses normal energy and good concentration. Per pt's self inventory, pt rates his depression, hopelessness and anxiety a 0/0/8, respectively. Pt writes on self inventory, "I am here voluntarily, detoxed, and have my own home. I need to get back to work to pay for home". Pt currently denies SI/HI and AVH . A. Labs and vitals monitored. Pt compliant with medications. Pt supported emotionally and encouraged to express concerns and ask questions.   R. Pt remains safe with 15 minute checks. Will continue POC.

## 2017-03-21 NOTE — BHH Group Notes (Signed)
[facility] LCSW Group Therapy Note  Date/Time:  03/21/2017 10:00-11:00AM  Type of Therapy and Topic:  Group Therapy:  Healthy and Unhealthy Supports  Participation Level:  Active   Description of Group:  Patients in this group were introduced to the idea of adding a variety of healthy supports to address the various needs in their lives.Patients discussed what additional healthy supports could be helpful in their recovery and wellness after discharge in order to prevent future hospitalizations.   An emphasis was placed on using counselor, doctor, therapy groups, 12-step groups, and problem-specific support groups to expand supports.  They also worked as a group on developing a specific plan for several patients to deal with unhealthy supports through [place], psychoeducation with loved ones, and even termination of relationships.   Therapeutic Goals:   1)  discuss importance of adding supports to stay well once out of the hospital  2)  compare healthy versus unhealthy supports and identify some examples of each  3)  generate ideas and descriptions of healthy supports that can be added  4)  offer mutual support about how to address unhealthy supports  5)  encourage active participation in and adherence to discharge plan    Summary of Patient Progress:  The patient expressed a willingness to add Alcoholics Anonymous and a sponsor to help in his recovery journey.  Even when some other patients spoke against the concept of AA, he remained adamant that this is the next step he needs to take to prolong his recovery in the future.   Therapeutic Modalities:   Motivational Interviewing Brief Solution-Focused Therapy  Selmer Dominion, LCSW

## 2017-03-21 NOTE — Progress Notes (Signed)
Patient has been up and active on the unit, attended group this evening and has voiced no complaints. Patient currently denies having pain, -si/hi/a/v hall. He is looking forward to discharge on tomorrow. Support and encouragement offered, safety maintained on unit, will continue to monitor.

## 2017-03-21 NOTE — Progress Notes (Addendum)
[facility] MD Progress Note  03/21/2017 8:59 AM Chad Morgan  MRN:  [id number]   Subjective: Chad Morgan reports " I am feeling fine, I have plans to get back into AA, patient is requesting to be discharge states he has to get back to work."  Objective: Chad Morgan is awake, alert and oriented, seen attending group session.  Denies suicidal or homicidal ideation during this assessment. Denies auditory or visual hallucination and does not appear to be responding to internal stimuli. Patient reports. " I am not withdrawing from alcohol, I feeling good and ready to go". Reports he was clean for 10 years and this was a slight "hiccup."  States he is employed by [employer] and need to get back to work on Monday. Patient to sign 72 hour request to discharge.   Patient reports he is medication compliant without mediation side effects. Report learning new coping skills. Denies depression or depressive symptoms.Support, encouragement and reassurance was provided.   Principal Problem: Major depressive disorder, recurrent, severe without psychotic features ([place]) Diagnosis:   Patient Active Problem List   Diagnosis Date Noted  . MDD (major depressive disorder), severe ([place]) [F32.2] 03/19/2017  . Severe episode of recurrent major depressive disorder, without psychotic features ([place]) [F33.2]   . Alcohol use disorder, severe, dependence ([place]) [F10.20] 05/20/2015  . Alcohol dependence with withdrawal with complication ([place]) [S97.026]   . Major depressive disorder, recurrent, severe without psychotic features ([place]) [F33.2]   . PTSD (post-traumatic stress disorder) [F43.10] 04/25/2014  . Severe major depression without psychotic features ([place]) [F32.2] 04/24/2014  . Suicidal ideations [R45.851]   . Alcohol dependence with withdrawal, uncomplicated ([place]) [V78.588] 04/23/2014  . Alcohol dependence with intoxication with complication ([place]) [F02.774] 04/23/2014  . Tobacco abuse [Z72.0] 04/16/2014  . Alcohol  withdrawal ([place]) [F10.239] 04/16/2014  . Alcohol dependence with uncomplicated withdrawal ([place]) [F10.230]   . Major depressive disorder, recurrent episode, moderate ([name]) [F33.1]   . Social anxiety disorder [F40.10] 01/09/2014  . Panic attacks [F41.0] 01/09/2014  . GAD (generalized anxiety disorder) [F41.1] 01/09/2014  . Alcohol dependence ([place]) [F10.20] 01/08/2014   Total Time spent with patient: 20 minutes  Past Psychiatric History:   Past Medical History:  Past Medical History:  Diagnosis Date  . Alcohol abuse   . Anxiety   . Depression   . PTSD (post-traumatic stress disorder)   . Seizures ([place])    with withdrawal    Past Surgical History:  Procedure Laterality Date  . WISDOM TOOTH EXTRACTION     Family History:  Family History  Problem Relation Age of Onset  . Alcoholism Father    Family Psychiatric  History:  Social History:  Social History   Substance and Sexual Activity  Alcohol Use Yes   Comment:  1/2 gallon of liquor daily     Social History   Substance and Sexual Activity  Drug Use Yes  . Types: Benzodiazepines, Marijuana   Comment: Patient denies     Social History   Socioeconomic History  . Marital status: Legally Separated    Spouse name: None  . Number of children: None  . Years of education: None  . Highest education level: None  Social Needs  . Financial resource strain: None  . Food insecurity - worry: None  . Food insecurity - inability: None  . Transportation needs - medical: None  . Transportation needs - non-medical: None  Occupational History  . None  Tobacco Use  . Smoking status: Current Every Day  Smoker    Packs/day: 1.00    Years: 16.00    Pack years: 16.00    Types: Cigarettes  . Smokeless tobacco: Never Used  Substance and Sexual Activity  . Alcohol use: Yes    Comment:  1/2 gallon of liquor daily  . Drug use: Yes    Types: Benzodiazepines, Marijuana    Comment: Patient denies   . Sexual activity: Yes  Other  Topics Concern  . None  Social History Narrative  . None   Additional Social History:                         Sleep: Fair  Appetite:  Fair  Current Medications: Current Facility-Administered Medications  Medication Dose Route Frequency Provider Last Rate Last Dose  . FLUoxetine (PROZAC) capsule 20 mg  20 mg Oral Daily Izediuno, Vincent A, MD   20 mg at 03/21/17 0741  . hydrOXYzine (ATARAX/VISTARIL) tablet 25 mg  25 mg Oral Q6H PRN Rankin, Shuvon B, NP   25 mg at 03/20/17 2121  . loperamide (IMODIUM) capsule 2-4 mg  2-4 mg Oral PRN Rankin, Shuvon B, NP      . LORazepam (ATIVAN) tablet 1 mg  1 mg Oral Q6H PRN Rankin, Shuvon B, NP   1 mg at 03/20/17 3710  . LORazepam (ATIVAN) tablet 1 mg  1 mg Oral TID Rankin, Shuvon B, NP       Followed by  . [START ON 03/22/2017] LORazepam (ATIVAN) tablet 1 mg  1 mg Oral BID Rankin, Shuvon B, NP       Followed by  . [START ON 03/24/2017] LORazepam (ATIVAN) tablet 1 mg  1 mg Oral Daily Rankin, Shuvon B, NP      . multivitamin with minerals tablet 1 tablet  1 tablet Oral Daily Rankin, Shuvon B, NP   1 tablet at 03/21/17 0741  . naltrexone (DEPADE) tablet 50 mg  50 mg Oral Daily Izediuno, [name], MD   50 mg at 03/21/17 0741  . nicotine (NICODERM CQ - dosed in mg/24 hours) patch 21 mg  21 mg Transdermal Daily Cobos, [name], MD   21 mg at 03/21/17 0745  . ondansetron (ZOFRAN-ODT) disintegrating tablet 4 mg  4 mg Oral Q6H PRN Rankin, Shuvon B, NP   4 mg at 03/20/17 1205  . thiamine (VITAMIN B-1) tablet 100 mg  100 mg Oral Daily Rankin, Shuvon B, NP   100 mg at 03/21/17 0741  . traZODone (DESYREL) tablet 50 mg  50 mg Oral QHS PRN [name], NP   50 mg at 03/20/17 2121    Lab Results: No results found for this or any previous visit (from the past 48 hour(s)).  Blood Alcohol level:  Lab Results  Component Value Date   ETH 370 (HH) 03/19/2017   ETH 124 (H) 62/69/4854    Metabolic Disorder Labs: No results found for: HGBA1C, MPG No  results found for: PROLACTIN No results found for: CHOL, TRIG, HDL, CHOLHDL, VLDL, LDLCALC  Physical Findings: AIMS: Facial and Oral Movements Muscles of Facial Expression: None, normal Lips and Perioral Area: None, normal Jaw: None, normal Tongue: None, normal,Extremity Movements Upper (arms, wrists, hands, fingers): None, normal Lower (legs, knees, ankles, toes): None, normal, Trunk Movements Neck, shoulders, hips: None, normal, Overall Severity Severity of abnormal movements (highest score from questions above): None, normal Incapacitation due to abnormal movements: None, normal Patient's awareness of abnormal movements (rate only patient's report): No Awareness,  Dental Status Current problems with teeth and/or dentures?: No Does patient usually wear dentures?: No  CIWA:  CIWA-Ar Total: 0 COWS:     Musculoskeletal: Strength & Muscle Tone: within normal limits Gait & Station: normal Patient leans: N/A  Psychiatric Specialty Exam: Physical Exam  Vitals reviewed. Constitutional: He is oriented to person, place, and time. He appears well-developed.  HENT:  Head: Normocephalic.  Cardiovascular: Normal rate.  Neurological: He is alert and oriented to person, place, and time.  Psychiatric: He has a normal mood and affect. His behavior is normal.    Review of Systems  Psychiatric/Behavioral: Positive for substance abuse. The patient is nervous/anxious.   All other systems reviewed and are negative.   Blood pressure 129/89, pulse 87, temperature 98.4 F (36.9 C), resp. rate 16, height 6\' 2"  (1.88 m), weight 82 kg (180 lb 12.4 oz).Body mass index is 23.21 kg/m.  General Appearance: Casual  Eye Contact:  Good  Speech:  Clear and Coherent  Volume:  Normal  Mood:  Anxious  Affect:  Congruent  Thought Process:  Linear  Orientation:  Full (Time, Place, and Person)  Thought Content:  Logical  Suicidal Thoughts:  No  Homicidal Thoughts:  No  Memory:  Immediate;   Fair Recent;    Fair Remote;   Fair  Judgement:  Fair  Insight:  Fair  Psychomotor Activity:  Normal  Concentration:  Concentration: Fair  Recall:  [place] of Knowledge:  Good  Language:  Good  Akathisia:  No  Handed:  Right  AIMS (if indicated):     Assets:  Communication Skills Desire for Improvement Financial Resources/Insurance Physical Health Resilience Social Support  ADL's:  Intact  Cognition:  WNL  Sleep:  Number of Hours: 6.5     Treatment Plan Summary: Daily contact with patient to assess and evaluate symptoms and progress in treatment and Medication management   Continue with current treatment plan on 03/21/2017 except where noted   - Major depressive disorder, recurrent, severe without psychosis - Continue fluoxetine 20 mg  daily for depression  - Anxiety - Continue atarax 25mg  po every 6 hours prn anxiety  - Alcohol dependence/detox               -Continue CIWA Ativan detox protocol  - Insomnia                 -Trazodone 50 mg at bedtime PRN sleep  -Encourage participation in groups and therapeutic milieu Discharge planning will be ongoing    Derrill [place], NP 03/21/2017, 8:59 AM

## 2017-03-21 NOTE — Progress Notes (Signed)
Patient did attend the evening speaker AA meeting.  

## 2017-03-21 NOTE — BHH Group Notes (Signed)
[facility] Group Notes:  (Nursing)  Date:  03/21/2017  Time:8:30 am Type of Therapy:  Orientation  Participation Level:  Active  Participation Quality:  Appropriate  Affect:  Appropriate  Cognitive:  Alert and Appropriate  Insight:  Appropriate  Engagement in Group:  Engaged  Modes of Intervention:  Discussion and Orientation  Summary of Progress/Problems:  Chad Morgan 03/21/2017, 7:43 PM

## 2017-03-22 NOTE — BHH Group Notes (Signed)
Orientation / Goals   Date:  03/22/2017  Time:  9:44 AM  Type of Therapy:  Nurse Education  /  The group focuses on teaching patients who their staff is, what their staff's responsibilities are and what the unit programming / scheduling looks like.  Participation Level:  Active  Participation Quality:  Attentive  Affect:  Appropriate  Cognitive:  Alert  Insight:  Improving  Engagement in Group:  Engaged  Modes of Intervention:  Education  Summary of Progress/Problems:  Chad Morgan 03/22/2017, 9:44 AM

## 2017-03-22 NOTE — BHH Suicide Risk Assessment (Signed)
[facility] INPATIENT:  Family/Significant Other Suicide Prevention Education  Suicide Prevention Education:  Education Completed; Rhea Bleacher (pt's girlfriend) [phone] has been identified by the patient as the family member/significant other with whom the patient will be residing, and identified as the person(s) who will aid the patient in the event of a mental health crisis (suicidal ideations/suicide attempt).  With written consent from the patient, the family member/significant other has been provided the following suicide prevention education, prior to the and/or following the discharge of the patient.  The suicide prevention education provided includes the following:  Suicide risk factors  Suicide prevention and interventions  National Suicide Hotline telephone number  [facility] assessment telephone number  [facility] Emergency Assistance [place] and/or Residential Mobile Crisis Unit telephone number  Request made of family/significant other to:  Remove weapons (e.g., guns, rifles, knives), all items previously/currently identified as safety concern.    Remove drugs/medications (over-the-counter, prescriptions, illicit drugs), all items previously/currently identified as a safety concern.  The family member/significant other verbalizes understanding of the suicide prevention education information provided.  The family member/significant other agrees to remove the items of safety concern listed above.  SPE and aftercare reviewed with pt's girlfriend. She has no safety concerns regarding pt discharging and states that he DOES NOT have access to guns/weapons at home. She reports that pt has been sounding much better on the phone and thinks he has been improving since being hospitalized.   Taelyn Nemes N Smart LCSW 03/22/2017, 2:41 PM

## 2017-03-22 NOTE — Progress Notes (Signed)
CSW and pt met individually due to pt's concern about not being on the schedule for discharge. Pt shared that he had a job that he was at risk of losing if he does not discharge today. "I don't understand. I was told I could discharge today by someone yesterday and now I'm being told differently. I want to speak with the doctor." CSW informed pt that the NP was going to see him today per MD to discuss this issue. Pt reports that otherwise, he feels much better and does not endorse SI/HI/AVH. "I'm just nervous about losing my job." CSW asked if he would reconsider allowing [place] to call someone close to him for collateral information. He gave CSW consent to call his close friend Rhea Bleacher at [phone]. CSW contacted her twice, left a message requesting call back at her earliest convenience. Pt remains anxious and standing outside of milieu waiting to speak with provider.   Maxie Better, MSW, LCSW Clinical Social Worker 03/22/2017 2:31 PM

## 2017-03-22 NOTE — Tx Team (Signed)
Interdisciplinary Treatment and Diagnostic Plan Update  03/22/2017 Time of Session: 0830AM THUAN Morgan MRN: [id number]  Principal Diagnosis: Major depressive disorder, recurrent, severe without psychotic features ([place])  Secondary Diagnoses: Principal Problem:   Major depressive disorder, recurrent, severe without psychotic features ([place]) Active Problems:   Alcohol dependence with withdrawal, uncomplicated ([place])   MDD (major depressive disorder), severe ([place])   Current Medications:  Current Facility-Administered Medications  Medication Dose Route Frequency Provider Last Rate Last Dose  . FLUoxetine (PROZAC) capsule 20 mg  20 mg Oral Daily Morgan, Chad A, MD   20 mg at 03/22/17 0806  . hydrOXYzine (ATARAX/VISTARIL) tablet 25 mg  25 mg Oral Q6H PRN Morgan, Chad B, NP   25 mg at 03/21/17 2213  . loperamide (IMODIUM) capsule 2-4 mg  2-4 mg Oral PRN Morgan, Chad B, NP      . LORazepam (ATIVAN) tablet 1 mg  1 mg Oral Q6H PRN Morgan, Chad B, NP   1 mg at 03/21/17 2130  . LORazepam (ATIVAN) tablet 1 mg  1 mg Oral BID Morgan, Chad B, NP       Followed by  . [START ON 03/24/2017] LORazepam (ATIVAN) tablet 1 mg  1 mg Oral Daily Morgan, Chad B, NP      . multivitamin with minerals tablet 1 tablet  1 tablet Oral Daily Morgan, Chad B, NP   1 tablet at 03/22/17 0806  . naltrexone (DEPADE) tablet 50 mg  50 mg Oral Daily Morgan, Chad Bouchard, MD   50 mg at 03/22/17 0807  . nicotine (NICODERM CQ - dosed in mg/24 hours) patch 21 mg  21 mg Transdermal Daily Morgan, Chad Peer, MD   21 mg at 03/22/17 0807  . ondansetron (ZOFRAN-ODT) disintegrating tablet 4 mg  4 mg Oral Q6H PRN Morgan, Chad B, NP   4 mg at 03/20/17 1205  . thiamine (VITAMIN B-1) tablet 100 mg  100 mg Oral Daily Morgan, Chad B, NP   100 mg at 03/22/17 0807  . traZODone (DESYREL) tablet 50 mg  50 mg Oral QHS PRN Chad Pour, NP   50 mg at 03/21/17 2130   PTA Medications: No medications prior to admission.     Patient Stressors: Marital or family conflict Substance abuse  Patient Strengths: Ability for insight Average or above average intelligence Capable of independent living General fund of knowledge  Treatment Modalities: Medication Management, Group therapy, Case management,  1 to 1 session with clinician, Psychoeducation, Recreational therapy.   Physician Treatment Plan for Primary Diagnosis: Major depressive disorder, recurrent, severe without psychotic features ([place]) Long Term Goal(s): Improvement in symptoms so as ready for discharge Improvement in symptoms so as ready for discharge   Short Term Goals: Ability to identify changes in lifestyle to reduce recurrence of condition will improve Ability to verbalize feelings will improve Ability to disclose and discuss suicidal ideas Ability to demonstrate self-control will improve Ability to identify and develop effective coping behaviors will improve Ability to maintain clinical measurements within normal limits will improve Compliance with prescribed medications will improve Ability to identify triggers associated with substance abuse/mental health issues will improve Ability to identify changes in lifestyle to reduce recurrence of condition will improve Ability to verbalize feelings will improve Ability to disclose and discuss suicidal ideas Ability to demonstrate self-control will improve Ability to identify and develop effective coping behaviors will improve Ability to maintain clinical measurements within normal limits will improve Compliance with prescribed medications will improve Ability to identify triggers  associated with substance abuse/mental health issues will improve  Medication Management: Evaluate patient's response, side effects, and tolerance of medication regimen.  Therapeutic Interventions: 1 to 1 sessions, Unit Group sessions and Medication administration.  Evaluation of Outcomes: Progressing  Physician  Treatment Plan for Secondary Diagnosis: Principal Problem:   Major depressive disorder, recurrent, severe without psychotic features ([place]) Active Problems:   Alcohol dependence with withdrawal, uncomplicated ([name])   MDD (major depressive disorder), severe ([place])  Long Term Goal(s): Improvement in symptoms so as ready for discharge Improvement in symptoms so as ready for discharge   Short Term Goals: Ability to identify changes in lifestyle to reduce recurrence of condition will improve Ability to verbalize feelings will improve Ability to disclose and discuss suicidal ideas Ability to demonstrate self-control will improve Ability to identify and develop effective coping behaviors will improve Ability to maintain clinical measurements within normal limits will improve Compliance with prescribed medications will improve Ability to identify triggers associated with substance abuse/mental health issues will improve Ability to identify changes in lifestyle to reduce recurrence of condition will improve Ability to verbalize feelings will improve Ability to disclose and discuss suicidal ideas Ability to demonstrate self-control will improve Ability to identify and develop effective coping behaviors will improve Ability to maintain clinical measurements within normal limits will improve Compliance with prescribed medications will improve Ability to identify triggers associated with substance abuse/mental health issues will improve     Medication Management: Evaluate patient's response, side effects, and tolerance of medication regimen.  Therapeutic Interventions: 1 to 1 sessions, Unit Group sessions and Medication administration.  Evaluation of Outcomes: Progressing   RN Treatment Plan for Primary Diagnosis: Major depressive disorder, recurrent, severe without psychotic features ([place]) Long Term Goal(s): Knowledge of disease and therapeutic regimen to maintain health will improve  Short  Term Goals: Ability to remain free from injury will improve, Ability to disclose and discuss suicidal ideas and Ability to identify and develop effective coping behaviors will improve  Medication Management: RN will administer medications as ordered by provider, will assess and evaluate patient's response and provide education to patient for prescribed medication. RN will report any adverse and/or side effects to prescribing provider.  Therapeutic Interventions: 1 on 1 counseling sessions, Psychoeducation, Medication administration, Evaluate responses to treatment, Monitor vital signs and CBGs as ordered, Perform/monitor CIWA, COWS, AIMS and Fall Risk screenings as ordered, Perform wound care treatments as ordered.  Evaluation of Outcomes: Progressing   LCSW Treatment Plan for Primary Diagnosis: Major depressive disorder, recurrent, severe without psychotic features ([place]) Long Term Goal(s): Safe transition to appropriate next level of care at discharge, Engage patient in therapeutic group addressing interpersonal concerns.  Short Term Goals: Engage patient in aftercare planning with referrals and resources, Facilitate patient progression through stages of change regarding substance use diagnoses and concerns and Identify triggers associated with mental health/substance abuse issues  Therapeutic Interventions: Assess for all discharge needs, 1 to 1 time with Social worker, Explore available resources and support systems, Assess for adequacy in community support network, Educate family and significant other(s) on suicide prevention, Complete Psychosocial Assessment, Interpersonal group therapy.  Evaluation of Outcomes: Progressing   Progress in Treatment: Attending groups: Yes. Participating in groups: Yes. Taking medication as prescribed: Yes. Toleration medication: Yes. Family/Significant other contact made: SPE completed with pt; pt declined to consent to collateral contact.  Patient  understands diagnosis: Yes. Discussing patient identified problems/goals with staff: Yes. Medical problems stabilized or resolved: Yes. Denies suicidal/homicidal ideation: Yes. Issues/concerns per patient self-inventory:  Yes. Other: n/a   New problem(s) identified: No, Describe:  n/a  New Short Term/Long Term Goal(s): detox, medication management for mood stabilization; elimination of SI thoughts; development of comprehensive mental wellness/sobriety plan.   Discharge Plan or Barriers: CSW assessing for appropriate referrals. Possbily follow-up at [facility]. [place] pamphlet and AA/NA information provided to pt for additional community support.   Reason for Continuation of Hospitalization: Anxiety Depression Medication stabilization Suicidal ideation Withdrawal symptoms  Estimated Length of Stay: Thursday, 03/25/17.   Attendees: Patient: Chad Morgan  03/22/2017 8:34 AM  Physician: Dr. Sanjuana Letters MD 03/22/2017 8:34 AM  Nursing: Benjamine Mola RN: Opal Sidles RN 03/22/2017 8:34 AM  RN Care Manager:x 03/22/2017 8:34 AM  Social Worker: Maxie Better, LCSW 03/22/2017 8:34 AM  Recreational Therapist: x 03/22/2017 8:34 AM  Other: Lindell Spar NP; Darnelle Maffucci Money NP 03/22/2017 8:34 AM  Other:  03/22/2017 8:34 AM  Other: 03/22/2017 8:34 AM    Scribe for Treatment Team: [place], LCSW 03/22/2017 8:34 AM

## 2017-03-22 NOTE — BHH Group Notes (Signed)
LCSW Group Therapy Note   03/22/2017 1:15pm   Type of Therapy and Topic:  Group Therapy:  Overcoming Obstacles   Participation Level:  Did Not Attend--invited. Chose to remain in hallway to wait for MD or NP.    Description of Group:    In this group patients will be encouraged to explore what they see as obstacles to their own wellness and recovery. They will be guided to discuss their thoughts, feelings, and behaviors related to these obstacles. The group will process together ways to cope with barriers, with attention given to specific choices patients can make. Each patient will be challenged to identify changes they are motivated to make in order to overcome their obstacles. This group will be process-oriented, with patients participating in exploration of their own experiences as well as giving and receiving support and challenge from other group members.   Therapeutic Goals: 1. Patient will identify personal and current obstacles as they relate to admission. 2. Patient will identify barriers that currently interfere with their wellness or overcoming obstacles.  3. Patient will identify feelings, thought process and behaviors related to these barriers. 4. Patient will identify two changes they are willing to make to overcome these obstacles:      Summary of Patient Progress   x   Therapeutic Modalities:   Cognitive Behavioral Therapy Solution Focused Therapy Motivational Interviewing Relapse Prevention Therapy  Kimber Relic Smart, LCSW 03/22/2017 12:16 PM

## 2017-03-22 NOTE — Progress Notes (Signed)
[facility] MD Progress Note  03/22/2017 3:54 PM Chad Morgan  MRN:  [id number]   Subjective: Chad Morgan reports " I need to leave today, so that I can get back to work."  Objective: Chad Morgan  Is requesting to be discharge today. MD to follow-up with patient. Social worker obtained collateral information prior to discharge. Reports he was held in the emergency dept for three days prior to his admission at behavioral health. Patient continues to deny suicidal or homicidal ideation.  Denies auditory or visual hallucination and does not appear to be responding to internal stimuli.Patient to sign 72 hour request to discharge on 03/21/2017.   Patient reports he is medication compliant without mediation side effects. Continues to deny  depression or depressive symptoms.Support, encouragement and reassurance was provided.   Principal Problem: Major depressive disorder, recurrent, severe without psychotic features ([place]) Diagnosis:   Patient Active Problem List   Diagnosis Date Noted  . MDD (major depressive disorder), severe ([place]) [F32.2] 03/19/2017  . Severe episode of recurrent major depressive disorder, without psychotic features ([place]) [F33.2]   . Alcohol use disorder, severe, dependence ([place]) [F10.20] 05/20/2015  . Alcohol dependence with withdrawal with complication ([place]) [H20.947]   . Major depressive disorder, recurrent, severe without psychotic features ([place]) [F33.2]   . PTSD (post-traumatic stress disorder) [F43.10] 04/25/2014  . Severe major depression without psychotic features ([place]) [F32.2] 04/24/2014  . Suicidal ideations [R45.851]   . Alcohol dependence with withdrawal, uncomplicated ([place]) [S96.283] 04/23/2014  . Alcohol dependence with intoxication with complication ([place]) [M62.947] 04/23/2014  . Tobacco abuse [Z72.0] 04/16/2014  . Alcohol withdrawal ([place]) [F10.239] 04/16/2014  . Alcohol dependence with uncomplicated withdrawal ([place]) [F10.230]   . Major depressive disorder, recurrent  episode, moderate ([name]) [F33.1]   . Social anxiety disorder [F40.10] 01/09/2014  . Panic attacks [F41.0] 01/09/2014  . GAD (generalized anxiety disorder) [F41.1] 01/09/2014  . Alcohol dependence ([place]) [F10.20] 01/08/2014   Total Time spent with patient: 20 minutes  Past Psychiatric History:   Past Medical History:  Past Medical History:  Diagnosis Date  . Alcohol abuse   . Anxiety   . Depression   . PTSD (post-traumatic stress disorder)   . Seizures ([place])    with withdrawal    Past Surgical History:  Procedure Laterality Date  . WISDOM TOOTH EXTRACTION     Family History:  Family History  Problem Relation Age of Onset  . Alcoholism Father    Family Psychiatric  History:  Social History:  Social History   Substance and Sexual Activity  Alcohol Use Yes   Comment:  1/2 gallon of liquor daily     Social History   Substance and Sexual Activity  Drug Use Yes  . Types: Benzodiazepines, Marijuana   Comment: Patient denies     Social History   Socioeconomic History  . Marital status: Legally Separated    Spouse name: None  . Number of children: None  . Years of education: None  . Highest education level: None  Social Needs  . Financial resource strain: None  . Food insecurity - worry: None  . Food insecurity - inability: None  . Transportation needs - medical: None  . Transportation needs - non-medical: None  Occupational History  . None  Tobacco Use  . Smoking status: Current Every Day Smoker    Packs/day: 1.00    Years: 16.00    Pack years: 16.00    Types: Cigarettes  . Smokeless tobacco: Never Used  Substance and Sexual Activity  .  Alcohol use: Yes    Comment:  1/2 gallon of liquor daily  . Drug use: Yes    Types: Benzodiazepines, Marijuana    Comment: Patient denies   . Sexual activity: Yes  Other Topics Concern  . None  Social History Narrative  . None   Additional Social History:                         Sleep: Fair  Appetite:   Fair  Current Medications: Current Facility-Administered Medications  Medication Dose Route Frequency Provider Last Rate Last Dose  . FLUoxetine (PROZAC) capsule 20 mg  20 mg Oral Daily Izediuno, Vincent A, MD   20 mg at 03/22/17 0806  . hydrOXYzine (ATARAX/VISTARIL) tablet 25 mg  25 mg Oral Q6H PRN Rankin, Shuvon B, NP   25 mg at 03/21/17 2213  . loperamide (IMODIUM) capsule 2-4 mg  2-4 mg Oral PRN Rankin, Shuvon B, NP      . LORazepam (ATIVAN) tablet 1 mg  1 mg Oral Q6H PRN Rankin, Shuvon B, NP   1 mg at 03/21/17 2130  . LORazepam (ATIVAN) tablet 1 mg  1 mg Oral BID Rankin, Shuvon B, NP       Followed by  . [START ON 03/24/2017] LORazepam (ATIVAN) tablet 1 mg  1 mg Oral Daily Rankin, Shuvon B, NP      . multivitamin with minerals tablet 1 tablet  1 tablet Oral Daily Rankin, Shuvon B, NP   1 tablet at 03/22/17 0806  . naltrexone (DEPADE) tablet 50 mg  50 mg Oral Daily Izediuno, [name], MD   50 mg at 03/22/17 0807  . nicotine (NICODERM CQ - dosed in mg/24 hours) patch 21 mg  21 mg Transdermal Daily Cobos, [name], MD   21 mg at 03/22/17 0807  . ondansetron (ZOFRAN-ODT) disintegrating tablet 4 mg  4 mg Oral Q6H PRN Rankin, Shuvon B, NP   4 mg at 03/20/17 1205  . thiamine (VITAMIN B-1) tablet 100 mg  100 mg Oral Daily Rankin, Shuvon B, NP   100 mg at 03/22/17 0807  . traZODone (DESYREL) tablet 50 mg  50 mg Oral QHS PRN [name], NP   50 mg at 03/21/17 2130    Lab Results: No results found for this or any previous visit (from the past 48 hour(s)).  Blood Alcohol level:  Lab Results  Component Value Date   ETH 370 (HH) 03/19/2017   ETH 124 (H) 77/82/4235    Metabolic Disorder Labs: No results found for: HGBA1C, MPG No results found for: PROLACTIN No results found for: CHOL, TRIG, HDL, CHOLHDL, VLDL, LDLCALC  Physical Findings: AIMS: Facial and Oral Movements Muscles of Facial Expression: None, normal Lips and Perioral Area: None, normal Jaw: None, normal Tongue: None,  normal,Extremity Movements Upper (arms, wrists, hands, fingers): None, normal Lower (legs, knees, ankles, toes): None, normal, Trunk Movements Neck, shoulders, hips: None, normal, Overall Severity Severity of abnormal movements (highest score from questions above): None, normal Incapacitation due to abnormal movements: None, normal Patient's awareness of abnormal movements (rate only patient's report): No Awareness, Dental Status Current problems with teeth and/or dentures?: No Does patient usually wear dentures?: No  CIWA:  CIWA-Ar Total: 1 COWS:     Musculoskeletal: Strength & Muscle Tone: within normal limits Gait & Station: normal Patient leans: N/A  Psychiatric Specialty Exam: Physical Exam  Vitals reviewed. Constitutional: He is oriented to person, place, and time. He appears well-developed.  HENT:  Head: Normocephalic.  Cardiovascular: Normal rate.  Neurological: He is alert and oriented to person, place, and time.  Psychiatric: He has a normal mood and affect. His behavior is normal.    Review of Systems  Psychiatric/Behavioral: Positive for substance abuse. The patient is nervous/anxious.   All other systems reviewed and are negative.   Blood pressure 132/82, pulse 87, temperature 98.4 F (36.9 C), resp. rate 16, height 6\' 2"  (1.88 m), weight 82 kg (180 lb 12.4 oz).Body mass index is 23.21 kg/m.  General Appearance: Casual  Eye Contact:  Good  Speech:  Clear and Coherent  Volume:  Normal  Mood:  Anxious  Affect:  Congruent  Thought Process:  Linear  Orientation:  Full (Time, Place, and Person)  Thought Content:  Logical  Suicidal Thoughts:  No  Homicidal Thoughts:  No  Memory:  Immediate;   Fair Recent;   Fair Remote;   Fair  Judgement:  Fair  Insight:  Fair  Psychomotor Activity:  Normal  Concentration:  Concentration: Fair  Recall:  [place] of Knowledge:  Good  Language:  Good  Akathisia:  No  Handed:  Right  AIMS (if indicated):     Assets:   Communication Skills Desire for Improvement Financial Resources/Insurance Physical Health Resilience Social Support  ADL's:  Intact  Cognition:  WNL  Sleep:  Number of Hours: 5.75     Treatment Plan Summary: Daily contact with patient to assess and evaluate symptoms and progress in treatment and Medication management   Continue with current treatment plan on 03/22/2017 except where noted   - Major depressive disorder, recurrent, severe without psychosis - Continue fluoxetine 20 mg  daily for depression  - Anxiety - Continue atarax 25mg  po every 6 hours prn anxiety  - Alcohol dependence/detox               -Continue CIWA Ativan detox protocol  - Insomnia                 -Trazodone 50 mg at bedtime PRN sleep  -Encourage participation in groups and therapeutic milieu Discharge planning will be ongoing    Derrill [place], NP 03/22/2017, 3:54 PM

## 2017-03-22 NOTE — Progress Notes (Signed)
Recreation Therapy Notes  Date: 3.18.19 Time: 9:30 a.m. Location: [address] Dayroom  Group Topic: Stress Management  Goal Area(s) Addresses:  Goal 1.1: To reduce stress  -Patient will report feeling a reduction in stress level  -Patient will identify the importance of stress management  -Patient will participate during stress management group treatment    Behavioral Response: Engaged  Intervention: Stress Management  Activity: Meditattion- Patients were in a peaceful environment with soft lighting enhancing patients mood.   Education: Stress Management, Discharge Planning.   Education Outcome: Acknowledges edcuation/In group clarification offered/Needs additional education  Clinical Observations/Feedback:: Patient attended and participated appropriately during stress management group treatment. Patient reported feeling a reduction in stress level.   Ranell Patrick, Recreation Therapy Intern   Ranell Patrick 03/22/2017 12:29 PM

## 2017-03-22 NOTE — Progress Notes (Signed)
DAR NOTE: Patient presents with calm  affect and pleasant  mood. Pt has been visible in the milieu interacting with peers. Pt stated he had a good night sleep, good appetite,and looking to be discharged. Denies pain, auditory and visual hallucinations.  Rates depression at 0, hopelessness at 0, and anxiety at 8.  Maintained on routine safety checks.  Medications given as prescribed.  Support and encouragement offered as needed.  Attended group and participated.  States goal for today is "leaving here and going to [place]." Patient observed socializing with peers in the dayroom.  Offered no complaint.

## 2017-03-23 DIAGNOSIS — F1390 Sedative, hypnotic, or anxiolytic use, unspecified, uncomplicated: Secondary | ICD-10-CM

## 2017-03-23 DIAGNOSIS — F1290 Cannabis use, unspecified, uncomplicated: Secondary | ICD-10-CM

## 2017-03-23 MED ORDER — NALTREXONE HCL 50 MG PO TABS: 50.0000 mg | ORAL_TABLET | Freq: Every day | ORAL | 0 refills | Status: DC

## 2017-03-23 MED ORDER — TRAZODONE HCL 50 MG PO TABS: 50.0000 mg | ORAL_TABLET | Freq: Every evening | ORAL | 0 refills | Status: DC | PRN

## 2017-03-23 MED ORDER — ACAMPROSATE CALCIUM 333 MG PO TBEC
666.0000 mg | DELAYED_RELEASE_TABLET | Freq: Three times a day (TID) | ORAL | Status: DC
  Filled 2017-03-23 (×6): qty 2

## 2017-03-23 MED ORDER — FLUOXETINE HCL 20 MG PO CAPS: 20.0000 mg | ORAL_CAPSULE | Freq: Every day | ORAL | 0 refills | Status: DC

## 2017-03-23 NOTE — Progress Notes (Signed)
Patient ID: Chad Morgan, male   DOB: March 29, 1980, 37 y.o.   MRN: [id number]   D: Patient pleasant on approach tonight. He is at the end of his ativan taper. Sleeping at change of shift but got up later. Currently denies any SI and contracts on the unit. No withdrawals reported at present.  A: Staff will continue to monitor on q 15 minute checks, follow treatment plan, and give medications as ordered. R: Cooperative on the unit.

## 2017-03-23 NOTE — Progress Notes (Signed)
Pt d/c from the hospital with a bus pass. All items returned. D/C instructions given, and prescriptions given. Pt denies si and hi.

## 2017-03-23 NOTE — Progress Notes (Signed)
Psychoeducational Group Note  Date:  03/23/2017 Time:  0039  Group Topic/Focus:  Wrap-Up Group:   The focus of this group is to help patients review their daily goal of treatment and discuss progress on daily workbooks.  Participation Level: Did Not Attend  Participation Quality:  Not Applicable  Affect:  Not Applicable  Cognitive:  Not Applicable  Insight:  Not Applicable  Engagement in Group: Not Applicable  Additional Comments:  The patient did not attend last evening's A.A.meeting since he was asleep in his room.   Archie Balboa S 03/23/2017, 12:39 AM

## 2017-03-23 NOTE — Progress Notes (Addendum)
  [facility] Adult Case Management Discharge Plan :  Will you be returning to the same living situation after discharge:  Yes,  home At discharge, do you have transportation home?: Yes,  bus pass requested.  Do you have the ability to pay for your medications: Yes,  mental health  Release of information consent forms completed and submitted to medical records by CSW.  Patient to Follow up at: Follow-up Information    [facility] Follow up on 03/24/2017.   Why:  Hospital follow-up at 1:00PM on Wednesday, 03/25/17. Please bring Photo ID if you have it. Thank you.  Contact information: [place] [address] [phone]           Next level of care provider has access to [place] and Suicide Prevention discussed: Yes,  SPE completed with pt's girlfriend. SPI pamphlet and mobile crisis information provided to pt as well.   Have you used any form of tobacco in the last 30 days? (Cigarettes, Smokeless Tobacco, Cigars, and/or Pipes): Yes  Has patient been referred to the Quitline?: Patient refused referral  Patient has been referred for addiction treatment: Yes  [employer], LCSW 03/23/2017, 9:57 AM

## 2017-03-23 NOTE — BHH Suicide Risk Assessment (Addendum)
[facility] Discharge Suicide Risk Assessment   Principal Problem: Major depressive disorder, recurrent, severe without psychotic features [facility]) Discharge Diagnoses:  Patient Active Problem List   Diagnosis Date Noted  . MDD (major depressive disorder), severe ([place]) [F32.2] 03/19/2017  . Severe episode of recurrent major depressive disorder, without psychotic features ([place]) [F33.2]   . Alcohol use disorder, severe, dependence ([place]) [F10.20] 05/20/2015  . Alcohol dependence with withdrawal with complication ([place]) [Y07.371]   . Major depressive disorder, recurrent, severe without psychotic features ([place]) [F33.2]   . PTSD (post-traumatic stress disorder) [F43.10] 04/25/2014  . Severe major depression without psychotic features ([place]) [F32.2] 04/24/2014  . Suicidal ideations [R45.851]   . Alcohol dependence with withdrawal, uncomplicated ([place]) [G62.694] 04/23/2014  . Alcohol dependence with intoxication with complication ([place]) [W54.627] 04/23/2014  . Tobacco abuse [Z72.0] 04/16/2014  . Alcohol withdrawal ([place]) [F10.239] 04/16/2014  . Alcohol dependence with uncomplicated withdrawal ([place]) [F10.230]   . Major depressive disorder, recurrent episode, moderate ([name]) [F33.1]   . Social anxiety disorder [F40.10] 01/09/2014  . Panic attacks [F41.0] 01/09/2014  . GAD (generalized anxiety disorder) [F41.1] 01/09/2014  . Alcohol dependence ([place]) [F10.20] 01/08/2014    Total Time spent with patient: 30 minutes  Musculoskeletal: Strength & Muscle Tone: within normal limits- subtle distal tremors, no agitation or restlessness  Gait & Station: normal Patient leans: N/A  Psychiatric Specialty Exam: ROS no headache, no chest pain, no shortness of breath, no vomiting   Blood pressure 123/90, pulse 98, temperature 97.7 F (36.5 C), temperature source Oral, resp. rate 16, height 6\' 2"  (1.88 m), weight 82 kg (180 lb 12.4 oz).Body mass index is 23.21 kg/m.  General Appearance: Well Groomed  Eye Contact::  Good   Speech:  Normal Rate409  Volume:  Normal  Mood:  reports he is feeling better, denies depression at present   Affect:  appropriate, reactive, slightly anxious  Thought Process:  Linear and Descriptions of Associations: Intact  Orientation:  Other:  fully alert and attentive   Thought Content:  denies hallucinations, no delusions, not internally preoccupied   Suicidal Thoughts:  No denies any suicidal or self injurious ideations, denies any homicidal or violent ideations  Homicidal Thoughts:  No  Memory:  recent and remote grossly intact   Judgement:  Other:  improving   Insight:  improving   Psychomotor Activity:  Normal- no restlessness or agitation, minimal distal tremors   Concentration:  Good  Recall:  Good  Fund of Knowledge:Good  Language: Good  Akathisia:  Negative  Handed:  Right  AIMS (if indicated):     Assets:  Communication Skills Desire for Improvement Resilience Others:  employment   Sleep:  Number of Hours: 5.5  Cognition: WNL  ADL's:  Intact   Mental Status Per Nursing Assessment::   On Admission:     Demographic Factors:  37 year old male   Loss Factors: Recent break up, financial issues, recent relapse   Historical Factors: Alcohol use disorder, depression  Risk Reduction Factors:   Employed and Positive coping skills or problem solving skills  Continued Clinical Symptoms:  37 year old male, presented to ED on 3/4 for depression, SI , alcohol use disorder , admission BAL 370. Patient is currently alert, attentive, calm, pleasant on approach. Reports mood is improved and currently denies feeling depressed, affect is reactive, appropriate, no thought disorder, denies suicidal or self injurious ideations and is future oriented, looking forward to returning to work later this week and attending [place] meetings regularly . Denies  any homicidal or violent ideations towards anyone, no psychotic symptoms .  At present denies residual or ongoing WDL symptoms-  presents calm, no psychomotor restlessness or agitation, minimal distal tremors, vitals are stable . Behavior on unit calm and in good control, pleasant on approach. Denies medication side effects- we reviewed side effects, and is aware of Opiate blocking properties of Naltrexone, which was started recently . Of note, due to elevated LFTs , which are likely related to recent alcohol abuse, we discussed discontinuing Naltrexone, and I have recommended he follow up with PCP to monitor LFTs.  Patient has no associated symptoms ( no RUQ pain, no fever, no acholia, no choluria, no jaundice, no vomiting , appetite normal) . Patient states that in the past Campral was effective and well tolerated and expresses interest in starting this medication. BUN and Creatinine are WNL. D/C Naltrexone. Start Campral 666 mgrs TID.   Cognitive Features That Contribute To Risk:  No gross cognitive deficits noted upon discharge. Is alert , attentive, and oriented x 3   Suicide Risk:  Mild:  Suicidal ideation of limited frequency, intensity, duration, and specificity.  There are no identifiable plans, no associated intent, mild dysphoria and related symptoms, good self-control (both objective and subjective assessment), few other risk factors, and identifiable protective factors, including available and accessible social support.  Follow-up Information    [facility] [place] [facility] Follow up.   Contact information: [address] [place] [address] [phone]           Plan Of Care/Follow-up recommendations:  Activity:  as tolerated  Diet:  regular  Tests:  NA Other:  See below  Patient is requesting discharge and has submitted letter requesting discharge there are no current grounds for involuntary commitment . He is leaving unit in good spirits , plans to return home and states plans to return to work later this week. Follow up as above, he also reports he intends to go to [place] meetings  regularly. Follow up with PCP- referred to [facility] , for ongoing medical follow ups and to monitor LFTs .   Jenne Campus, MD 03/23/2017, 9:46 AM
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
## 2017-03-31 DIAGNOSIS — F1020 Alcohol dependence, uncomplicated: Secondary | ICD-10-CM | POA: Insufficient documentation

## 2017-05-10 ENCOUNTER — Encounter (HOSPITAL_COMMUNITY): Payer: Self-pay | Admitting: *Deleted

## 2017-05-10 ENCOUNTER — Inpatient Hospital Stay (HOSPITAL_COMMUNITY)
Admission: AD | Admit: 2017-05-10 | Discharge: 2017-05-13 | DRG: 885 | Disposition: A | Discharge status: Home / Self Care | Payer: No Typology Code available for payment source | Source: Intra-hospital | Attending: Psychiatry | Admitting: Psychiatry

## 2017-05-10 ENCOUNTER — Emergency Department (HOSPITAL_COMMUNITY)
Admission: EM | Admit: 2017-05-10 | Discharge: 2017-05-10 | Disposition: A | Discharge status: Other Acute Inpatient Hospital | Payer: Self-pay | Attending: Emergency Medicine | Admitting: Emergency Medicine

## 2017-05-10 ENCOUNTER — Other Ambulatory Visit: Payer: Self-pay

## 2017-05-10 DIAGNOSIS — F17210 Nicotine dependence, cigarettes, uncomplicated: Secondary | ICD-10-CM | POA: Diagnosis present

## 2017-05-10 DIAGNOSIS — F341 Dysthymic disorder: Secondary | ICD-10-CM

## 2017-05-10 DIAGNOSIS — F1310 Sedative, hypnotic or anxiolytic abuse, uncomplicated: Secondary | ICD-10-CM | POA: Diagnosis present

## 2017-05-10 DIAGNOSIS — Y908 Blood alcohol level of 240 mg/100 ml or more: Secondary | ICD-10-CM | POA: Insufficient documentation

## 2017-05-10 DIAGNOSIS — Z811 Family history of alcohol abuse and dependence: Secondary | ICD-10-CM

## 2017-05-10 DIAGNOSIS — F1010 Alcohol abuse, uncomplicated: Secondary | ICD-10-CM

## 2017-05-10 DIAGNOSIS — F332 Major depressive disorder, recurrent severe without psychotic features: Secondary | ICD-10-CM | POA: Diagnosis present

## 2017-05-10 DIAGNOSIS — R45851 Suicidal ideations: Secondary | ICD-10-CM | POA: Insufficient documentation

## 2017-05-10 DIAGNOSIS — F10230 Alcohol dependence with withdrawal, uncomplicated: Secondary | ICD-10-CM | POA: Diagnosis not present

## 2017-05-10 DIAGNOSIS — F1021 Alcohol dependence, in remission: Secondary | ICD-10-CM | POA: Diagnosis not present

## 2017-05-10 DIAGNOSIS — F1390 Sedative, hypnotic, or anxiolytic use, unspecified, uncomplicated: Secondary | ICD-10-CM | POA: Diagnosis not present

## 2017-05-10 DIAGNOSIS — F1290 Cannabis use, unspecified, uncomplicated: Secondary | ICD-10-CM | POA: Diagnosis not present

## 2017-05-10 DIAGNOSIS — G4700 Insomnia, unspecified: Secondary | ICD-10-CM | POA: Diagnosis not present

## 2017-05-10 DIAGNOSIS — Z653 Problems related to other legal circumstances: Secondary | ICD-10-CM | POA: Diagnosis not present

## 2017-05-10 DIAGNOSIS — Z6379 Other stressful life events affecting family and household: Secondary | ICD-10-CM | POA: Diagnosis not present

## 2017-05-10 DIAGNOSIS — F1220 Cannabis dependence, uncomplicated: Secondary | ICD-10-CM | POA: Insufficient documentation

## 2017-05-10 DIAGNOSIS — F322 Major depressive disorder, single episode, severe without psychotic features: Secondary | ICD-10-CM

## 2017-05-10 DIAGNOSIS — F419 Anxiety disorder, unspecified: Secondary | ICD-10-CM | POA: Diagnosis not present

## 2017-05-10 LAB — COMPREHENSIVE METABOLIC PANEL
ALT: 209 U/L — AB (ref 17–63)
ANION GAP: 15 (ref 5–15)
AST: 177 U/L — ABNORMAL HIGH (ref 15–41)
Albumin: 4 g/dL (ref 3.5–5.0)
Alkaline Phosphatase: 67 U/L (ref 38–126)
BUN: 13 mg/dL (ref 6–20)
CHLORIDE: 99 mmol/L — AB (ref 101–111)
CO2: 27 mmol/L (ref 22–32)
CREATININE: 0.98 mg/dL (ref 0.61–1.24)
Calcium: 8.5 mg/dL — ABNORMAL LOW (ref 8.9–10.3)
GFR calc non Af Amer: >60 mL/min (ref >60)
Glucose, Bld: 125 mg/dL — ABNORMAL HIGH (ref 65–99)
POTASSIUM: 3.4 mmol/L — AB (ref 3.5–5.1)
SODIUM: 141 mmol/L (ref 135–145)
Total Bilirubin: 1 mg/dL (ref 0.3–1.2)
Total Protein: 7.2 g/dL (ref 6.5–8.1)

## 2017-05-10 LAB — CBC
HEMATOCRIT: 51 % (ref 39.0–52.0)
HEMOGLOBIN: 18.5 g/dL — AB (ref 13.0–17.0)
MCH: 33.9 pg (ref 26.0–34.0)
MCHC: 36.3 g/dL — ABNORMAL HIGH (ref 30.0–36.0)
MCV: 93.6 fL (ref 78.0–100.0)
Platelets: 190 10*3/uL (ref 150–400)
RBC: 5.45 MIL/uL (ref 4.22–5.81)
RDW: 15.2 % (ref 11.5–15.5)
WBC: 9.8 10*3/uL (ref 4.0–10.5)

## 2017-05-10 LAB — RAPID URINE DRUG SCREEN, HOSP PERFORMED
AMPHETAMINES: NOT DETECTED
BENZODIAZEPINES: POSITIVE — AB
Barbiturates: NOT DETECTED
Cocaine: NOT DETECTED
Opiates: NOT DETECTED
TETRAHYDROCANNABINOL: POSITIVE — AB

## 2017-05-10 LAB — ETHANOL: Alcohol, Ethyl (B): 344 mg/dL (ref <10)

## 2017-05-10 LAB — SALICYLATE LEVEL

## 2017-05-10 LAB — ACETAMINOPHEN LEVEL

## 2017-05-10 MED ORDER — THIAMINE HCL 100 MG/ML IJ SOLN
Freq: Once | INTRAVENOUS | Status: AC
  Administered 2017-05-10: 05:00:00 via INTRAVENOUS
  Filled 2017-05-10: qty 1000

## 2017-05-10 MED ORDER — LORAZEPAM 1 MG PO TABS
1.0000 mg | ORAL_TABLET | Freq: Four times a day (QID) | ORAL | Status: DC
  Administered 2017-05-10 (×2): 1 mg via ORAL
  Filled 2017-05-10 (×2): qty 1

## 2017-05-10 MED ORDER — MAGNESIUM HYDROXIDE 400 MG/5ML PO SUSP: 30.0000 mL | Freq: Every day | ORAL | Status: DC | PRN

## 2017-05-10 MED ORDER — HYDROXYZINE HCL 25 MG PO TABS: 25.0000 mg | ORAL_TABLET | Freq: Four times a day (QID) | ORAL | Status: DC | PRN

## 2017-05-10 MED ORDER — LORAZEPAM 1 MG PO TABS
1.0000 mg | ORAL_TABLET | Freq: Four times a day (QID) | ORAL | Status: AC
  Administered 2017-05-10 – 2017-05-11 (×3): 1 mg via ORAL
  Filled 2017-05-10 (×3): qty 1

## 2017-05-10 MED ORDER — VITAMIN B-1 100 MG PO TABS
100.0000 mg | ORAL_TABLET | Freq: Every day | ORAL | Status: DC
  Administered 2017-05-11 – 2017-05-13 (×3): 100 mg via ORAL
  Filled 2017-05-10 (×4): qty 1

## 2017-05-10 MED ORDER — ALUM & MAG HYDROXIDE-SIMETH 200-200-20 MG/5ML PO SUSP: 30.0000 mL | ORAL | Status: DC | PRN

## 2017-05-10 MED ORDER — LORAZEPAM 1 MG PO TABS
1.0000 mg | ORAL_TABLET | Freq: Two times a day (BID) | ORAL | Status: AC
  Administered 2017-05-12 – 2017-05-13 (×2): 1 mg via ORAL
  Filled 2017-05-10 (×2): qty 1

## 2017-05-10 MED ORDER — LORAZEPAM 1 MG PO TABS
1.0000 mg | ORAL_TABLET | Freq: Three times a day (TID) | ORAL | Status: AC
  Administered 2017-05-11 – 2017-05-12 (×3): 1 mg via ORAL
  Filled 2017-05-10 (×3): qty 1

## 2017-05-10 MED ORDER — LORAZEPAM 1 MG PO TABS
1.0000 mg | ORAL_TABLET | Freq: Four times a day (QID) | ORAL | Status: DC | PRN
  Administered 2017-05-11 – 2017-05-12 (×2): 1 mg via ORAL
  Filled 2017-05-10 (×2): qty 1

## 2017-05-10 MED ORDER — HYDROXYZINE HCL 50 MG PO TABS
50.0000 mg | ORAL_TABLET | Freq: Three times a day (TID) | ORAL | Status: DC | PRN
  Filled 2017-05-10 (×2): qty 10
  Filled 2017-05-10: qty 1

## 2017-05-10 MED ORDER — SODIUM CHLORIDE 0.9 % IV BOLUS: 1000.0000 mL | Freq: Once | INTRAVENOUS | Status: DC

## 2017-05-10 MED ORDER — VITAMIN B-1 100 MG PO TABS: 100.0000 mg | ORAL_TABLET | Freq: Every day | ORAL | Status: DC

## 2017-05-10 MED ORDER — ONDANSETRON 4 MG PO TBDP
4.0000 mg | ORAL_TABLET | Freq: Once | ORAL | Status: AC
  Administered 2017-05-10: 4 mg via ORAL
  Filled 2017-05-10: qty 1

## 2017-05-10 MED ORDER — ONDANSETRON 4 MG PO TBDP
4.0000 mg | ORAL_TABLET | Freq: Four times a day (QID) | ORAL | Status: DC | PRN
Start: 2017-05-10 — End: 2017-05-10

## 2017-05-10 MED ORDER — ADULT MULTIVITAMIN W/MINERALS CH
1.0000 | ORAL_TABLET | Freq: Every day | ORAL | Status: DC
  Administered 2017-05-10: 1 via ORAL
  Filled 2017-05-10: qty 1

## 2017-05-10 MED ORDER — LORAZEPAM 1 MG PO TABS: 1.0000 mg | ORAL_TABLET | Freq: Four times a day (QID) | ORAL | Status: DC | PRN

## 2017-05-10 MED ORDER — ONDANSETRON 4 MG PO TBDP
4.0000 mg | ORAL_TABLET | Freq: Four times a day (QID) | ORAL | Status: DC | PRN
  Administered 2017-05-11: 4 mg via ORAL
  Filled 2017-05-10: qty 1

## 2017-05-10 MED ORDER — ADULT MULTIVITAMIN W/MINERALS CH: 1.0000 | ORAL_TABLET | Freq: Every day | ORAL | Status: DC

## 2017-05-10 MED ORDER — LORAZEPAM 1 MG PO TABS: 1.0000 mg | ORAL_TABLET | Freq: Every day | ORAL | Status: DC

## 2017-05-10 MED ORDER — ONDANSETRON 4 MG PO TBDP: 4.0000 mg | ORAL_TABLET | Freq: Four times a day (QID) | ORAL | Status: DC | PRN

## 2017-05-10 MED ORDER — LORAZEPAM 1 MG PO TABS: 1.0000 mg | ORAL_TABLET | Freq: Three times a day (TID) | ORAL | Status: DC

## 2017-05-10 MED ORDER — TRAZODONE HCL 50 MG PO TABS
50.0000 mg | ORAL_TABLET | Freq: Every evening | ORAL | Status: DC | PRN
  Administered 2017-05-10: 50 mg via ORAL
  Filled 2017-05-10: qty 1

## 2017-05-10 MED ORDER — HYDROXYZINE HCL 25 MG PO TABS
25.0000 mg | ORAL_TABLET | Freq: Four times a day (QID) | ORAL | Status: DC | PRN
  Administered 2017-05-10: 25 mg via ORAL
  Filled 2017-05-10: qty 1

## 2017-05-10 MED ORDER — THIAMINE HCL 100 MG/ML IJ SOLN: 100.0000 mg | Freq: Once | INTRAMUSCULAR | Status: DC

## 2017-05-10 MED ORDER — THIAMINE HCL 100 MG/ML IJ SOLN
100.0000 mg | Freq: Once | INTRAMUSCULAR | Status: AC
  Administered 2017-05-10: 100 mg via INTRAMUSCULAR
  Filled 2017-05-10: qty 2

## 2017-05-10 MED ORDER — ACETAMINOPHEN 325 MG PO TABS
650.0000 mg | ORAL_TABLET | Freq: Four times a day (QID) | ORAL | Status: DC | PRN
  Administered 2017-05-11: 650 mg via ORAL
  Filled 2017-05-10: qty 2

## 2017-05-10 MED ORDER — LOPERAMIDE HCL 2 MG PO CAPS: 2.0000 mg | ORAL_CAPSULE | ORAL | Status: DC | PRN

## 2017-05-10 MED ORDER — ADULT MULTIVITAMIN W/MINERALS CH
1.0000 | ORAL_TABLET | Freq: Every day | ORAL | Status: DC
  Administered 2017-05-11 – 2017-05-13 (×3): 1 via ORAL
  Filled 2017-05-10 (×4): qty 1

## 2017-05-10 MED ORDER — LORAZEPAM 1 MG PO TABS: 1.0000 mg | ORAL_TABLET | Freq: Two times a day (BID) | ORAL | Status: DC

## 2017-05-10 NOTE — ED Notes (Signed)
Pt changed into scrubs and wanded by security  

## 2017-05-10 NOTE — ED Notes (Signed)
Pt c/o nausea.  

## 2017-05-10 NOTE — ED Notes (Signed)
Patient is calm and cooperative in his room.  Patient complains of fullness in his head, body aches and tremors.  Patient states "I just want to know why I am feeling so bad.  I want help.  No one can tell me why I feel like this.

## 2017-05-10 NOTE — ED Triage Notes (Signed)
Pt stated "I'm depressed, have been drinking a lot since getting out of the [employer].  I went to the [place] and they Rx'd me xanax for depression and anxiety but I didn't want to take them.  I drink 0.5 gal of Tequila day."

## 2017-05-10 NOTE — Tx Team (Signed)
Initial Treatment Plan 05/10/2017 4:49 PM CAZ WEAVER ULA:[id number]    PATIENT STRESSORS: Marital or family conflict Medication change or noncompliance Substance abuse   PATIENT STRENGTHS: Ability for insight Average or above average intelligence Capable of independent living General fund of knowledge Motivation for treatment/growth   PATIENT IDENTIFIED PROBLEMS: Depression Suicidal thoughts Substance Abuse "Get detoxed and stay sober again"                     DISCHARGE CRITERIA:  Ability to meet basic life and health needs Improved stabilization in mood, thinking, and/or behavior Verbal commitment to aftercare and medication compliance Withdrawal symptoms are absent or subacute and managed without 24-hour nursing intervention  PRELIMINARY DISCHARGE PLAN: Attend aftercare/continuing care group  PATIENT/FAMILY INVOLVEMENT: This treatment plan has been presented to and reviewed with the patient, Chad Morgan, and/or family member, .  The patient and family have been given the opportunity to ask questions and make suggestions.  Olga Seyler, [place], [place] 05/10/2017, 4:49 PM

## 2017-05-10 NOTE — ED Notes (Signed)
Bed: [facility]PT1 Expected date:  Expected time:  Means of arrival:  Comments: 

## 2017-05-10 NOTE — BH Assessment (Signed)
[place] Assessment Progress Note  Per Ambrose Finland, MD, this pt requires psychiatric hospitalization.  Leonia Reader, RN, [facility] has assigned pt to [facility], [place] Rm 302-2; [place] will be ready to receive pt between 14:30 and 15:00.  Pt has signed Voluntary Admission and Consent for Treatment, as well as Consent to Release Information to no one, and signed forms have been faxed to [facility].  Pt's nurse, Edd Arbour, has been notified, and agrees to send original paperwork along with pt via [place] [place], and to call report to [phone].  Jalene Mullet, [place] Coordinator [phone]

## 2017-05-10 NOTE — Progress Notes (Signed)
TTS consulted with Lindon Romp, NP who recommends inpt treatment. EDP Dr. Randal Buba, MD and triage nurse Margaretha Sheffield, RN notified of inpt disposition. TTS consulted with [facility] who states she can review the pt for possible admission at [facility] pending medical clearance. Pt's BAL is currently 344.  Lind Covert, MSW, LCSW Therapeutic Triage Specialist  [phone]

## 2017-05-10 NOTE — Patient Outreach (Signed)
ED Peer Support Specialist Patient Intake (Complete at intake & 30-60 Day Follow-up)  Name: [name]  MRN: [id number]  Age: 37 y.o.   Date of Admission: 05/10/2017  Intake: Initial Comments:      Primary Reason Admitted: SI, depression, poly substance use with alcohol and cannabis  Lab values: Alcohol/ETOH: Positive Positive UDS? Yes Amphetamines: No Barbiturates: No Benzodiazepines: Yes Cocaine: No Opiates: No Cannabinoids: Yes  Demographic information: Gender: Male Ethnicity: White Marital Status: Single Insurance Status: Uninsured/Self-pay Ecologist (Work Neurosurgeon, Physicist, medical, etc.: No Lives with: Alone Living situation: House/Apartment(Inbetween places)  Reported Patient History: Patient reported health conditions: Other (comment), Depression(PTSD, anxiety, ) Patient aware of HIV and hepatitis status: Yes (comment)(Negative)  In past year, has patient visited ED for any reason? Yes  Number of ED visits: 1  Reason(s) for visit: alcohol use, SI  In past year, has patient been hospitalized for any reason? Yes  Number of hospitalizations: 1  Reason(s) for hospitalization: alcohol use, SI  In past year, has patient been arrested? No  Number of arrests:    Reason(s) for arrest:    In past year, has patient been incarcerated? No  Number of incarcerations:    Reason(s) for incarceration:    In past year, has patient received medication-assisted treatment? No  In past year, patient received the following treatments:    In past year, has patient received any harm reduction services? No  Did this include any of the following?    In past year, has patient received care from a mental health provider for diagnosis other than SUD? No  In past year, is this first time patient has overdosed? (has not overdosed)  Number of past overdoses:    In past year, is this first time patient has been hospitalized for an overdose?  (has not overdosed)  Number of hospitalizations for overdose(s):    Is patient currently receiving treatment for a mental health diagnosis? No  Patient reports experiencing difficulty participating in SUD treatment: No    Most important reason(s) for this difficulty?    Has patient received prior services for treatment? No  In past, patient has received services from following agencies:    Plan of Care:  Suggested follow up at these agencies/treatment centers: (Patient has been accepted in the 300 hall at [facility] will follow up with the patient when he is transferred over to [facility].)  Other information: [facility] met with the patient and provided substance use recovery support. Patient is interested in substance use treatment after discharge from [facility]. [facility] will continue to follow up with the patient at [facility] to provide substance use recovery support and help with recovery resources. [facility] will also provide [facility] contact information.    Mason Jim, [place]  05/10/2017 2:07 PM

## 2017-05-10 NOTE — ED Notes (Addendum)
Date and time results received: 05/10/17 4:26 AM (use smartphrase ".now" to insert current time)  Test: EtOH Critical Value: 344  Name of Provider Notified: [place]  Orders Received? Or Actions Taken?: Continue to monitor

## 2017-05-10 NOTE — BH Assessment (Addendum)
Assessment Note  Chad Morgan is an 37 y.o. male who presents to the ED voluntarily accompanied by his girlfriend. Pt states he has been increasingly depressed due to his wife cheating on him and taking his children away. Pt states he continues to pay child support every month, however she does not allow him to see his children. Pt also states he has been drinking up to 1/2 a gallon of alcohol daily. Pt states he wants to slit his wrists in order to kill himself. Pt states he has attempted in the past by "putting a gun to my head." Pt states he "missed by an inch."   Pt has a hx of inpt admissions at [facility] and [facility] c/o similar concerns. Pt states he used to receive services at the [facility] hospital however he stopped because he felt as though they only wanted to prescribe Xanax and not actually treat him.   Pt states he is not sleeping or eating. He states he feels hopeless. Pt also reported to the EDP that he would "cut my wrists." Pt denied having access to weapons to TTS however per chart he did report to the EDP that he has access to firearms.   TTS consulted with Lindon Romp, NP who recommends inpt treatment. EDP Dr. Randal Buba, MD and triage nurse Margaretha Sheffield, RN notified of inpt disposition. TTS consulted with [facility] at [facility] who states she can review the pt for possible admission at [facility] pending medical clearance. Pt's BAL is currently 344.  Diagnosis: MDD, recurrent, severe w/o psychosis; Alcohol use disorder, severe; Cannabis use disorder, severe   Past Medical History:  Past Medical History:  Diagnosis Date  . Alcohol abuse   . Anxiety   . Depression   . PTSD (post-traumatic stress disorder)   . Seizures ([name])    with withdrawal    Past Surgical History:  Procedure Laterality Date  . WISDOM TOOTH EXTRACTION      Family History:  Family History  Problem Relation Age of Onset  . Alcoholism Father     Social History:  reports that he has been smoking cigarettes.  He has a 16.00 pack-year  smoking history. He has never used smokeless tobacco. He reports that he drinks alcohol. He reports that he has current or past drug history. Drugs: Benzodiazepines and Marijuana.  Additional Social History:  Alcohol / Drug Use Pain Medications: See MAR Prescriptions: See MAR Over the Counter: See MAR History of alcohol / drug use?: Yes Longest period of sobriety (when/how long): 3 years Substance #1 Name of Substance 1: Alcohol 1 - Age of First Use: age 74, pt began to drink excessively in 2009 1 - Amount (size/oz): 1/2 gallon 1 - Frequency: daily 1 - Duration: ongoing 1 - Last Use / Amount: 05/09/17 Substance #2 Name of Substance 2: Cannabis 2 - Age of First Use: teens 2 - Amount (size/oz): varies 2 - Frequency: nightly to assist with sleeping 2 - Duration: ongoing 2 - Last Use / Amount: 05/09/17  CIWA: CIWA-Ar BP: (!) 118/97 Pulse Rate: (!) 134 COWS:    Allergies: No Known Allergies  Home Medications:  (Not in a hospital admission)  OB/GYN Status:  No LMP for male patient.  General Assessment Data Location of Assessment: [facility] ED TTS Assessment: In system Is this a Tele or Face-to-Face Assessment?: Face-to-Face Is this an Initial Assessment or a Re-assessment for this encounter?: Initial Assessment Marital status: Single Is patient pregnant?: No Pregnancy Status: No Living Arrangements: Alone Can pt return  to current living arrangement?: Yes Admission Status: Voluntary Is patient capable of signing voluntary admission?: Yes Referral Source: Self/Family/Friend Insurance type: none on file     Crisis Care Plan Living Arrangements: Alone Name of Psychiatrist: none Name of Therapist: none  Education Status Is patient currently in school?: No Is the patient employed, unemployed or receiving disability?: Employed  Risk to self with the past 6 months Suicidal Ideation: Yes-Currently Present Has patient been a risk to self within the past 6 months prior to  admission? : Yes Suicidal Intent: Yes-Currently Present Has patient had any suicidal intent within the past 6 months prior to admission? : Yes Is patient at risk for suicide?: Yes Suicidal Plan?: Yes-Currently Present Has patient had any suicidal plan within the past 6 months prior to admission? : Yes Specify Current Suicidal Plan: pt states a plan to slit his wrists Access to Means: Yes Specify Access to Suicidal Means: pt has access to knives What has been your use of drugs/alcohol within the last 12 months?: daily alcohol and cannabis use  Previous Attempts/Gestures: Yes How many times?: 1 Triggers for Past Attempts: Spouse contact Intentional Self Injurious Behavior: None Family Suicide History: Yes(paternal family ) Recent stressful life event(s): Divorce, Other (Comment)(alcohol abuse ) Persecutory voices/beliefs?: No Depression: Yes Depression Symptoms: Insomnia, Feeling worthless/self pity, Loss of interest in usual pleasures Substance abuse history and/or treatment for substance abuse?: Yes Suicide prevention information given to non-admitted patients: Not applicable  Risk to Others within the past 6 months Homicidal Ideation: No Does patient have any lifetime risk of violence toward others beyond the six months prior to admission? : No Thoughts of Harm to Others: No Current Homicidal Intent: No Current Homicidal Plan: No Access to Homicidal Means: No History of harm to others?: No Assessment of Violence: None Noted Does patient have access to weapons?: No Criminal Charges Pending?: No Does patient have a court date: No Is patient on probation?: No  Psychosis Hallucinations: None noted Delusions: None noted  Mental Status Report Appearance/Hygiene: Disheveled Eye Contact: Good Motor Activity: Freedom of movement Speech: Slurred Level of Consciousness: Alert Mood: Depressed, Despair, Helpless, Sad, Sullen, Worthless, low self-esteem Affect: Depressed,  Sad Anxiety Level: None Thought Processes: Relevant, Coherent Judgement: Impaired Orientation: Person, Place, Situation, Appropriate for developmental age, Time Obsessive Compulsive Thoughts/Behaviors: None  Cognitive Functioning Concentration: Normal Memory: Remote Intact, Recent Intact Is patient IDD: No Is patient DD?: No Insight: Poor Impulse Control: Poor Appetite: Fair Have you had any weight changes? : No Change Sleep: Decreased Total Hours of Sleep: 3 Vegetative Symptoms: None  ADLScreening [facility] Assessment Services) Patient's cognitive ability adequate to safely complete daily activities?: Yes Patient able to express need for assistance with ADLs?: Yes Independently performs ADLs?: Yes (appropriate for developmental age)  Prior Inpatient Therapy Prior Inpatient Therapy: Yes Prior Therapy Dates: 2019, 2017, 2016 Prior Therapy Facilty/Provider(s): [facility], [facility] Reason for Treatment: MDD, SA, ALCOHOL  Prior Outpatient Therapy Prior Outpatient Therapy: Yes Prior Therapy Dates: 2018 Prior Therapy Facilty/Provider(s): [facility] Reason for Treatment: MED MANAGEMENT  Does patient have an ACCT team?: No Does patient have Intensive In-House Services?  : No Does patient have [facility] services? : No Does patient have P4CC services?: No  ADL Screening (condition at time of admission) Patient's cognitive ability adequate to safely complete daily activities?: Yes Is the patient deaf or have difficulty hearing?: No Does the patient have difficulty seeing, even when wearing glasses/contacts?: No Does the patient have difficulty concentrating, remembering, or making decisions?: No  Patient able to express need for assistance with ADLs?: Yes Does the patient have difficulty dressing or bathing?: No Independently performs ADLs?: Yes (appropriate for developmental age) Does the patient have difficulty walking or climbing stairs?: No Weakness of Legs: None Weakness of Arms/Hands:  None  Home Assistive Devices/Equipment Home Assistive Devices/Equipment: None    Abuse/Neglect Assessment (Assessment to be complete while patient is alone) Abuse/Neglect Assessment Can Be Completed: Yes Physical Abuse: Denies Verbal Abuse: Denies Sexual Abuse: Denies Exploitation of patient/patient's resources: Denies Self-Neglect: Denies     Regulatory affairs officer (For Healthcare) Does Patient Have a Medical Advance Directive?: No Would patient like information on creating a medical advance directive?: No - Patient declined    Additional Information 1:1 In Past 12 Months?: No CIRT Risk: No Elopement Risk: No Does patient have medical clearance?: Yes     Disposition: TTS consulted with Lindon Romp, NP who recommends inpt treatment. EDP Dr. Randal Buba, MD and triage nurse Margaretha Sheffield, RN notified of inpt disposition. TTS consulted with [facility] at [facility] who states she can review the pt for possible admission at [facility] pending medical clearance. Pt's BAL is currently 344.  Disposition Initial Assessment Completed for this Encounter: Yes Disposition of Patient: Admit Type of inpatient treatment program: Adult(per Lindon Romp, NP) Patient refused recommended treatment: No  On Site Evaluation by:   Reviewed with Physician:    Lyanne Co 05/10/2017 5:03 AM

## 2017-05-10 NOTE — ED Notes (Signed)
Bed: WA25 Expected date:  Expected time:  Means of arrival:  Comments: Triage 1 

## 2017-05-10 NOTE — Progress Notes (Signed)
Chad Morgan is a 37 year old male pt admitted on voluntary basis. On admission, Chad Morgan appears tremulous and endorses depression and withdrawal. He reports that he has not been taking his medications and reports that he has been abusing alcohol and reports that he has been abusing alcohol on a daily basis with the last usage being yesterday. He reports on-going conflict with his girlfriend and reports that he was living in a hotel but unsure of where he will go once he is discharged. He denies any current SI on admission and is able to contract for safety while in the hospital. Chad Morgan was oriented to the unit and safety maintained.

## 2017-05-10 NOTE — ED Notes (Signed)
Bed: [facility] Expected date: 05/10/17 Expected time: 8:15 AM Means of arrival:  Comments: Held for 25
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
## 2017-05-11 DIAGNOSIS — F332 Major depressive disorder, recurrent severe without psychotic features: Principal | ICD-10-CM

## 2017-05-11 DIAGNOSIS — Z811 Family history of alcohol abuse and dependence: Secondary | ICD-10-CM

## 2017-05-11 DIAGNOSIS — F17210 Nicotine dependence, cigarettes, uncomplicated: Secondary | ICD-10-CM

## 2017-05-11 DIAGNOSIS — R45851 Suicidal ideations: Secondary | ICD-10-CM

## 2017-05-11 DIAGNOSIS — F1021 Alcohol dependence, in remission: Secondary | ICD-10-CM

## 2017-05-11 DIAGNOSIS — Z6379 Other stressful life events affecting family and household: Secondary | ICD-10-CM

## 2017-05-11 DIAGNOSIS — G4700 Insomnia, unspecified: Secondary | ICD-10-CM

## 2017-05-11 DIAGNOSIS — Z653 Problems related to other legal circumstances: Secondary | ICD-10-CM

## 2017-05-11 DIAGNOSIS — F419 Anxiety disorder, unspecified: Secondary | ICD-10-CM

## 2017-05-11 MED ORDER — FLUOXETINE HCL 20 MG PO CAPS
20.0000 mg | ORAL_CAPSULE | Freq: Every day | ORAL | Status: DC
  Administered 2017-05-11 – 2017-05-13 (×3): 20 mg via ORAL
  Filled 2017-05-11: qty 1
  Filled 2017-05-11: qty 7
  Filled 2017-05-11 (×5): qty 1

## 2017-05-11 MED ORDER — TRAZODONE HCL 100 MG PO TABS
100.0000 mg | ORAL_TABLET | Freq: Every evening | ORAL | Status: DC | PRN
  Administered 2017-05-11 – 2017-05-12 (×2): 100 mg via ORAL
  Filled 2017-05-11 (×2): qty 1
  Filled 2017-05-11: qty 7

## 2017-05-11 MED ORDER — GABAPENTIN 300 MG PO CAPS
300.0000 mg | ORAL_CAPSULE | Freq: Three times a day (TID) | ORAL | Status: DC
  Administered 2017-05-11 – 2017-05-13 (×6): 300 mg via ORAL
  Filled 2017-05-11: qty 1
  Filled 2017-05-11: qty 21
  Filled 2017-05-11 (×3): qty 1
  Filled 2017-05-11: qty 21
  Filled 2017-05-11 (×6): qty 1
  Filled 2017-05-11: qty 21

## 2017-05-11 MED ORDER — HYDROXYZINE HCL 50 MG PO TABS: 50.0000 mg | ORAL_TABLET | Freq: Three times a day (TID) | ORAL | 0 refills | Status: DC | PRN

## 2017-05-11 MED ORDER — NALTREXONE HCL 50 MG PO TABS
25.0000 mg | ORAL_TABLET | Freq: Every day | ORAL | Status: DC
  Administered 2017-05-11 – 2017-05-13 (×3): 25 mg via ORAL
  Filled 2017-05-11: qty 4
  Filled 2017-05-11 (×5): qty 1

## 2017-05-11 MED ORDER — NICOTINE POLACRILEX 2 MG MT GUM
2.0000 mg | CHEWING_GUM | OROMUCOSAL | Status: DC | PRN
  Administered 2017-05-11: 2 mg via ORAL

## 2017-05-11 NOTE — H&P (Signed)
Psychiatric Admission Assessment Adult  Patient Identification: Chad Morgan MRN:  [id number] Date of Evaluation:  05/11/2017 Chief Complaint:  Mdd recurrent severe without psychosis Principal Diagnosis: Major depressive disorder, recurrent, severe without psychotic features ([place]) Diagnosis:   Patient Active Problem List   Diagnosis Date Noted  . Major depressive disorder, recurrent, severe without psychotic features ([place]) [F33.2]     Priority: High  . Alcohol dependence with withdrawal, uncomplicated ([place]) [B15.176] 04/23/2014    Priority: High  . MDD (major depressive disorder), recurrent severe, without psychosis ([place]) [F33.2] 05/10/2017  . MDD (major depressive disorder), severe ([place]) [F32.2] 03/19/2017  . Severe episode of recurrent major depressive disorder, without psychotic features ([place]) [F33.2]   . Alcohol use disorder, severe, dependence ([place]) [F10.20] 05/20/2015  . Alcohol dependence with withdrawal with complication ([place]) [H60.737]   . PTSD (post-traumatic stress disorder) [F43.10] 04/25/2014  . Severe major depression without psychotic features ([place]) [F32.2] 04/24/2014  . Suicidal ideations [R45.851]   . Alcohol dependence with intoxication with complication ([place]) [T06.269] 04/23/2014  . Tobacco abuse [Z72.0] 04/16/2014  . Alcohol withdrawal ([place]) [F10.239] 04/16/2014  . Alcohol dependence with uncomplicated withdrawal ([place]) [F10.230]   . Major depressive disorder, recurrent episode, moderate ([name]) [F33.1]   . Social anxiety disorder [F40.10] 01/09/2014  . Panic attacks [F41.0] 01/09/2014  . GAD (generalized anxiety disorder) [F41.1] 01/09/2014  . Alcohol dependence ([place]) [F10.20] 01/08/2014   History of Present Illness: On admission:  37 y.o. male who presents to the ED voluntarily accompanied by his girlfriend. Pt states he has been increasingly depressed due to his wife cheating on him and taking his children away. Pt states he continues to pay child support every  month, however she does not allow him to see his children. Pt also states he has been drinking up to 1/2 a gallon of alcohol daily. Pt states he wants to slit his wrists in order to kill himself. Pt states he has attempted in the past by "putting a gun to my head." Pt states he "missed by an inch."   Pt has a hx of inpt admissions at [facility] and [facility] c/o similar concerns. Pt states he used to receive services at the [facility] hospital however he stopped because he felt as though they only wanted to prescribe Xanax and not actually treat him.   Pt states he is not sleeping or eating. He states he feels hopeless. Pt also reported to the EDP that he would "cut my wrists." Pt denied having access to weapons to TTS however per chart he did report to the EDP that he has access to firearms.   Today, he states he feels a "little better", still experiencing some nausea and tremors from withdrawing from alcohol and Xanax.  6/10 depression with no suicidal ideations, sleep was nonexistent and appetite is poor.  Encouraged to get out of bed when he feels better and increase fluid intake.  Associated Signs/Symptoms: Depression Symptoms:  depressed mood, hypersomnia, fatigue, difficulty concentrating, hopelessness, suicidal attempt, decreased appetite, (Hypo) Manic Symptoms:  none Anxiety Symptoms:  none Psychotic Symptoms:  none PTSD Symptoms: NA Total Time spent with patient: 45 minutes  Past Psychiatric History: depression, alcohol dependence  Is the patient at risk to self? Yes.    Has the patient been a risk to self in the past 6 months? Yes.    Has the patient been a risk to self within the distant past? Yes.    Is the patient a risk to others? No.  Has  the patient been a risk to others in the past 6 months? No.  Has the patient been a risk to others within the distant past? No.   Prior Inpatient Therapy:   Prior Outpatient Therapy:    Alcohol Screening: 1. How often do you have a drink containing  alcohol?: 4 or more times a week 2. How many drinks containing alcohol do you have on a typical day when you are drinking?: 10 or more 3. How often do you have six or more drinks on one occasion?: Daily or almost daily AUDIT-C Score: 12 4. How often during the last year have you found that you were not able to stop drinking once you had started?: Daily or almost daily 5. How often during the last year have you failed to do what was normally expected from you becasue of drinking?: Daily or almost daily 6. How often during the last year have you needed a first drink in the morning to get yourself going after a heavy drinking session?: Daily or almost daily 7. How often during the last year have you had a feeling of guilt of remorse after drinking?: Daily or almost daily 8. How often during the last year have you been unable to remember what happened the night before because you had been drinking?: Daily or almost daily 9. Have you or someone else been injured as a result of your drinking?: Yes, during the last year 10. Has a relative or friend or a doctor or another health worker been concerned about your drinking or suggested you cut down?: Yes, during the last year Alcohol Use Disorder Identification Test Final Score (AUDIT): 40 Intervention/Follow-up: Alcohol Education Substance Abuse History in the last 12 months:  Yes.   Consequences of Substance Abuse: NA Previous Psychotropic Medications: No  Psychological Evaluations: Yes  Past Medical History:  Past Medical History:  Diagnosis Date  . Alcohol abuse   . Anxiety   . Depression   . PTSD (post-traumatic stress disorder)   . Seizures ([place])    with withdrawal    Past Surgical History:  Procedure Laterality Date  . WISDOM TOOTH EXTRACTION     Family History:  Family History  Problem Relation Age of Onset  . Alcoholism Father    Family Psychiatric  History: father-alcoholism Tobacco Screening: Have you used any form of tobacco in  the last 30 days? (Cigarettes, Smokeless Tobacco, Cigars, and/or Pipes): Yes Tobacco use, Select all that apply: 5 or more cigarettes per day Are you interested in Tobacco Cessation Medications?: Yes, will notify MD for an order Counseled patient on smoking cessation including recognizing danger situations, developing coping skills and basic information about quitting provided: Refused/Declined practical counseling Social History:  Social History   Substance and Sexual Activity  Alcohol Use Yes   Comment:  1/2 gallon of liquor daily     Social History   Substance and Sexual Activity  Drug Use Yes  . Types: Benzodiazepines, Marijuana   Comment: Patient denies     Additional Social History:   Allergies:  No Known Allergies Lab Results:  Results for orders placed or performed during the hospital encounter of 05/10/17 (from the past 48 hour(s))  Comprehensive metabolic panel     Status: Abnormal   Collection Time: 05/10/17  3:34 AM  Result Value Ref Range   Sodium 141 135 - 145 mmol/L   Potassium 3.4 (L) 3.5 - 5.1 mmol/L   Chloride 99 (L) 101 - 111 mmol/L   CO2 27  22 - 32 mmol/L   Glucose, Bld 125 (H) 65 - 99 mg/dL   BUN 13 6 - 20 mg/dL   Creatinine, Ser 0.98 0.61 - 1.24 mg/dL   Calcium 8.5 (L) 8.9 - 10.3 mg/dL   Total Protein 7.2 6.5 - 8.1 g/dL   Albumin 4.0 3.5 - 5.0 g/dL   AST 177 (H) 15 - 41 U/L   ALT 209 (H) 17 - 63 U/L   Alkaline Phosphatase 67 38 - 126 U/L   Total Bilirubin 1.0 0.3 - 1.2 mg/dL   GFR calc non Af Amer >60 >60 mL/min   GFR calc Af Amer >60 >60 mL/min    Comment: (NOTE) The eGFR has been calculated using the CKD EPI equation. This calculation has not been validated in all clinical situations. eGFR's persistently <60 mL/min signify possible Chronic Kidney Disease.    Anion gap 15 5 - 15    Comment: Performed at [facility], [place] [address], [place], [place] [address]  Ethanol     Status: Abnormal   Collection Time: 05/10/17  3:34 AM   Result Value Ref Range   Alcohol, Ethyl (B) 344 (HH) <10 mg/dL    Comment:        LOWEST DETECTABLE LIMIT FOR SERUM ALCOHOL IS 10 mg/dL FOR MEDICAL PURPOSES ONLY CRITICAL RESULT CALLED TO, READ BACK BY AND VERIFIED WITH: DOSTER,S AT 10425 ON 05/10/17 BY MOHAMED,A Performed at [place] [address], [place], [place] [address]   cbc     Status: Abnormal   Collection Time: 05/10/17  3:34 AM  Result Value Ref Range   WBC 9.8 4.0 - 10.5 K/uL   RBC 5.45 4.22 - 5.81 MIL/uL   Hemoglobin 18.5 (H) 13.0 - 17.0 g/dL   HCT 51.0 39.0 - 52.0 %   MCV 93.6 78.0 - 100.0 fL   MCH 33.9 26.0 - 34.0 pg   MCHC 36.3 (H) 30.0 - 36.0 g/dL   RDW 15.2 11.5 - 15.5 %   Platelets 190 150 - 400 K/uL    Comment: Performed at [facility], [place] [address] [place], [place] [address]  Rapid urine drug screen (hospital performed)     Status: Abnormal   Collection Time: 05/10/17  3:34 AM  Result Value Ref Range   Opiates NONE DETECTED NONE DETECTED   Cocaine NONE DETECTED NONE DETECTED   Benzodiazepines POSITIVE (A) NONE DETECTED   Amphetamines NONE DETECTED NONE DETECTED   Tetrahydrocannabinol POSITIVE (A) NONE DETECTED   Barbiturates NONE DETECTED NONE DETECTED    Comment: (NOTE) DRUG SCREEN FOR MEDICAL PURPOSES ONLY.  IF CONFIRMATION IS NEEDED FOR ANY PURPOSE, NOTIFY LAB WITHIN 5 DAYS. LOWEST DETECTABLE LIMITS FOR URINE DRUG SCREEN Drug Class                     Cutoff (ng/mL) Amphetamine and metabolites    1000 Barbiturate and metabolites    200 Benzodiazepine                 299 Tricyclics and metabolites     300 Opiates and metabolites        300 Cocaine and metabolites        300 THC                            50 Performed at [facility], [place] [address] [place], [place] [address]   Acetaminophen  level     Status: Abnormal   Collection Time: 05/10/17  3:34 AM  Result Value Ref Range   Acetaminophen (Tylenol), Serum <10 (L) 10 - 30  ug/mL    Comment:        THERAPEUTIC CONCENTRATIONS VARY SIGNIFICANTLY. A RANGE OF 10-30 ug/mL MAY BE AN EFFECTIVE CONCENTRATION FOR MANY PATIENTS. HOWEVER, SOME ARE BEST TREATED AT CONCENTRATIONS OUTSIDE THIS RANGE. ACETAMINOPHEN CONCENTRATIONS >150 ug/mL AT 4 HOURS AFTER INGESTION AND >50 ug/mL AT 12 HOURS AFTER INGESTION ARE OFTEN ASSOCIATED WITH TOXIC REACTIONS. Performed at [facility], [place] [address] [place], [name] [address]   Salicylate level     Status: None   Collection Time: 05/10/17  3:34 AM  Result Value Ref Range   Salicylate Lvl <5.1 2.8 - 30.0 mg/dL    Comment: Performed at [facility], [place] [address] [place], [place] [address]    Blood Alcohol level:  Lab Results  Component Value Date   ETH 344 [facility]) 05/10/2017   ETH 370 (HH) 73/71/0626    Metabolic Disorder Labs:  No results found for: HGBA1C, MPG No results found for: PROLACTIN No results found for: CHOL, TRIG, HDL, CHOLHDL, VLDL, LDLCALC  Current Medications: Current Facility-Administered Medications  Medication Dose Route Frequency Provider Last Rate Last Dose  . acetaminophen (TYLENOL) tablet 650 mg  650 mg Oral Q6H PRN Ethelene Hal, NP   650 mg at 05/11/17 9485  . alum & mag hydroxide-simeth (MAALOX/MYLANTA) 200-200-20 MG/5ML suspension 30 mL  30 mL Oral Q4H PRN Ethelene Hal, NP      . FLUoxetine (PROZAC) capsule 20 mg  20 mg Oral Daily Sharma Covert, MD      . hydrOXYzine (ATARAX/VISTARIL) tablet 50 mg  50 mg Oral TID PRN Ethelene Hal, NP      . LORazepam (ATIVAN) tablet 1 mg  1 mg Oral Q6H PRN Ethelene Hal, NP      . LORazepam (ATIVAN) tablet 1 mg  1 mg Oral TID Ethelene Hal, NP       Followed by  . [START ON 05/12/2017] LORazepam (ATIVAN) tablet 1 mg  1 mg Oral BID Ethelene Hal, NP       Followed by  . [START ON 05/14/2017] LORazepam (ATIVAN) tablet 1 mg  1 mg Oral Daily Ethelene Hal, NP       . magnesium hydroxide (MILK OF MAGNESIA) suspension 30 mL  30 mL Oral Daily PRN Ethelene Hal, NP      . multivitamin with minerals tablet 1 tablet  1 tablet Oral Daily Ethelene Hal, NP   1 tablet at 05/11/17 4627  . naltrexone (DEPADE) tablet 25 mg  25 mg Oral Daily Sharma Covert, MD      . ondansetron (ZOFRAN-ODT) disintegrating tablet 4 mg  4 mg Oral Q6H PRN Ethelene Hal, NP   4 mg at 05/11/17 0920  . thiamine (VITAMIN B-1) tablet 100 mg  100 mg Oral Daily Ethelene Hal, NP   100 mg at 05/11/17 0916  . traZODone (DESYREL) tablet 50 mg  50 mg Oral QHS PRN Ethelene Hal, NP   50 mg at 05/10/17 2144   PTA Medications: Medications Prior to Admission  Medication Sig Dispense Refill Last Dose  . FLUoxetine (PROZAC) 20 MG capsule Take 1 capsule (20 mg total) by mouth daily. For mood control (Patient not taking: Reported on 05/10/2017) 30 capsule 0 Not Taking at Unknown time  .  traZODone (DESYREL) 50 MG tablet Take 1 tablet (50 mg total) by mouth at bedtime as needed for sleep. (Patient not taking: Reported on 05/10/2017) 30 tablet 0 Not Taking at Unknown time    Musculoskeletal: Strength & Muscle Tone: within normal limits Gait & Station: normal Patient leans: N/A  Psychiatric Specialty Exam: Physical Exam  Constitutional: He is oriented to person, place, and time. He appears well-developed and well-nourished.  HENT:  Head: Normocephalic.  Neck: Normal range of motion.  Respiratory: Effort normal.  Musculoskeletal: Normal range of motion.  Neurological: He is alert and oriented to person, place, and time.  Psychiatric: His speech is normal and behavior is normal. Judgment and thought content normal. Cognition and memory are normal. He exhibits a depressed mood.    Review of Systems  Psychiatric/Behavioral: Positive for depression and substance abuse.  All other systems reviewed and are negative.   Blood pressure (!) 122/100, pulse (!) 124,  temperature 98.1 F (36.7 C), temperature source Oral, resp. rate 16, height 6' 2"  (1.88 m), weight 85.7 kg (189 lb).Body mass index is 24.27 kg/m.  General Appearance: Casual  Eye Contact:  Fair  Speech:  Normal Rate  Volume:  Decreased  Mood:  Depressed  Affect:  Congruent  Thought Process:  Coherent and Descriptions of Associations: Intact  Orientation:  Full (Time, Place, and Person)  Thought Content:  Rumination  Suicidal Thoughts:  No  Homicidal Thoughts:  No  Memory:  Immediate;   Fair Recent;   Fair Remote;   Fair  Judgement:  Impaired  Insight:  Fair  Psychomotor Activity:  Decreased  Concentration:  Concentration: Fair and Attention Span: Fair  Recall:  [employer] of Knowledge:  Fair  Language:  Good  Akathisia:  No  Handed:  Right  AIMS (if indicated):     Assets:  Leisure Time Physical Health Resilience Social Support  ADL's:  Intact  Cognition:  WNL  Sleep:  Number of Hours: 6.75    Treatment Plan Summary: Daily contact with patient to assess and evaluate symptoms and progress in treatment, Medication management and Plan :  Major depressive disorder, recurrent, severe, without psychosis: -Started Prozac 20 mg daily for depression  Alcohol dependence with withdrawal: -Ativan alcohol detox protocol in place -Started gabapentin 300 mg TID for withdrawal symptoms  Insomnia: Started Trazodone 50 mg at bedtime PRN sleep, increased to 100 mg at bedtime PRN sleep  -Enroll in individual and group therapy -Discharge planning -Social work involvement  Observation Level/Precautions:  15 minute checks  Laboratory:  completed in the ED, reviewed, stable  Psychotherapy:  Individual and group therapy  Medications:  See MAR  Consultations:  None  Discharge Concerns:  None  Estimated LOS:  3-5 days  Other:     Physician Treatment Plan for Primary Diagnosis: Major depressive disorder, recurrent, severe without psychotic features ([place]) Long Term Goal(s):  Improvement in symptoms so as ready for discharge  Short Term Goals: Ability to identify changes in lifestyle to reduce recurrence of condition will improve, Ability to verbalize feelings will improve, Ability to disclose and discuss suicidal ideas, Ability to demonstrate self-control will improve, Ability to identify and develop effective coping behaviors will improve, Ability to maintain clinical measurements within normal limits will improve, Compliance with prescribed medications will improve and Ability to identify triggers associated with substance abuse/mental health issues will improve  Physician Treatment Plan for Secondary Diagnosis: Principal Problem:   Major depressive disorder, recurrent, severe without psychotic features ([place]) Active Problems:  Alcohol dependence with withdrawal, uncomplicated ([name])   MDD (major depressive disorder), recurrent severe, without psychosis ([place])  Long Term Goal(s): Improvement in symptoms so as ready for discharge  Short Term Goals: Ability to identify changes in lifestyle to reduce recurrence of condition will improve, Ability to verbalize feelings will improve, Ability to disclose and discuss suicidal ideas, Ability to demonstrate self-control will improve, Ability to identify and develop effective coping behaviors will improve, Ability to maintain clinical measurements within normal limits will improve, Compliance with prescribed medications will improve and Ability to identify triggers associated with substance abuse/mental health issues will improve  I certify that inpatient services furnished can reasonably be expected to improve the patient's condition.    Waylan Boga, NP 5/7/201910:56 AM

## 2017-05-11 NOTE — [facility] Suicide Risk Assessment (Signed)
[place] INPATIENT:  Family/Significant Other Suicide Prevention Education  Suicide Prevention Education:  Contact Attempts: Estill Bakes (pt's girlfriend) [phone] has been identified by the patient as the family member/significant other with whom the patient will be residing, and identified as the person(s) who will aid the patient in the event of a mental health crisis.  With written consent from the patient, two attempts were made to provide suicide prevention education, prior to and/or following the patient's discharge.  We were unsuccessful in providing suicide prevention education.  A suicide education pamphlet was given to the patient to share with family/significant other.  Date and time of first attempt: 10:30AM on 05/11/17. Left message requesting a call back at her earliest convenience.  Dymin Dingledine N Smart LCSW 05/11/2017, 10:31 AM   SPE completed with pt's girlfriend. Aftercare options also reviewed.  Maxie Better, MSW, LCSW Clinical Social Worker 05/12/2017 3:46 PM

## 2017-05-11 NOTE — BHH Counselor (Signed)
Adult Comprehensive Assessment  Patient ID: Chad Morgan, male   DOB: [birth year], 37 y.o.   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 Pt divorced and doesn't see kids often due to financial issues. Behind on child support as well.  Financial / Lack of resources (include bankruptcy): Denies stressors Housing / Lack of housing: will lose apt due to financial issues and is looking into moving to [employer] or [employer].  Physical health (include injuries & life threatening diseases): Lost weight recently due to not eating.  Social relationships: Does not have a big circle of friends, girlfriend is primary support. Substance abuse: ongoing alcohol abuse for a few months; 1/5-1/2 gallon liquor daily.  Bereavement / Loss: Loss of relationship with fiancee; loss of not having his kids in his life is "driving me crazy" because he is paying for child support, but is not allowed to see them.  Living/Environment/Situation: Living Arrangements: Alone in apt. Not able to afford  Living conditions (as described by patient or guardian): temporary  How long has patient lived in current situation?: few months What is atmosphere in current home: comfortable; temporary  Family History: Marital status: Other (comment)(Just broke up with fiancee of 2 years). Has new girlfriend  Are you sexually active?: Yes What is your sexual orientation?: Straight Does patient have children?: Yes How many children?: 3 How is patient's relationship with their children?: 14yo, 12yo, and 8yo - has not seen them since 2015 - wants very much to have a relationship with them. Behind on child support which is a major stressor for pt. "I have to go to court next month because of it."    Childhood History: By whom was/is the patient raised?: Father, Grandparents Additional childhood history information: Mother left when he was 43 months old, saw her once in 79. Description of patient's relationship with caregiver when they were a child: Grandmother - good relationship; Father - seldom there but when he was there it was strained, distant, not supportive. Patient's description of current relationship with people who raised him/her: Grandmother - not doing well with Alzheimer's, can hardly communicate; Father - no relationship; Mother - no relationship. How were you disciplined when you got in trouble as a child/adolescent?: Did not get a lot of discipline Does patient have siblings?: Yes Number of Siblings: 2 Description of patient's current relationship with siblings: 1 brother and 1 sister - talk on Facebook, they do not live locally. 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  If attempted suicide, did drugs/alcohol play a role in this?: Yes Alcohol/Substance Abuse Treatment Hx: Past Tx, Inpatient, Past detox If yes, describe treatment: [facility] 2016 and 2017, [facility] 7 days earlier in March 2019; [facility] 03/2017 Has alcohol/substance abuse ever caused legal problems?: Yes  Social Support System: Patient's Community Support System: None Describe Community Support System: N/A Type of faith/religion: Darrick Meigs How does patient's faith help to cope with current illness?: Helps him to get through, helped him with sobriety  Leisure/Recreation: Leisure and Hobbies: Work  Strengths/Needs: What things does the patient do well?: Fixing heating and air conditioners In what areas does patient struggle / problems for patient: Depression, rebuilding support, needing to go to Alcoholics Anonymous this time  Discharge Plan: Does patient have access to transportation?: No Plan for no access to transportation at discharge: bus Will patient be returning to same living situation after discharge?: No--Tee Pearline Cables from [facility] to visit with pt today for interview.  Currently receiving community mental health services: No If no, would patient like referral for services when discharged?: Yes (What county?)([place]/no insurance[place].  Does patient have financial barriers  related to discharge medications?: Yes Patient description of barriers related to discharge medications: No insurance, so availability will depend on the cost of the medication.         Summary/Recommendations:   Summary and Recommendations (to be completed by the evaluator): Patient is 37yo male living in [place], [place] ([place]). He was admitted to the hospital seeking treatment for alcohol abuse, depression, anxiety, and for medication stabilization. Patient currently denies SI/HI/AVH but states that he felt passively suicidal upon admission. Patient reports that he discharged from [facility] in 03/2017 but relapsed soon after and has been drinking between 1/5 and 1/2 gallon liquor daily. Patient reports financial strain as his primary stressor and states that he will likely lose apt and is currently behind on child support. Patient is employed but unsure if he will have his job after hospitalization (heating and air). Patient is interested in halfway[facility] placement and follow-up at [facility] if he is in the [place] area at discharge. Patient has a diagnosis of PTSD, MDD, and Alcohol Use Disorder, severe. Recommendations for patient include: crisis stabilization, therapeutic milieu, encourage group attendance and participation, medication management for detox/mood stabilization, and development of comprehensive mental wellness/sobriety plan. CSW assessing for appropriate referrals.   Kimber Relic Smart LCSW 05/11/2017 10:24 AM

## 2017-05-11 NOTE — Progress Notes (Signed)
Recreation Therapy Notes  Animal-Assisted Activity (AAA) Program Checklist/Progress Notes Patient Eligibility Criteria Checklist & Daily Group note for Rec Tx Intervention  Date: 5.7.19 Time: 6168 Location: [address]   AAA/T Program Assumption of Risk Form signed by Teacher, music or Parent Legal Guardian YES   Patient is free of allergies or sever asthma YES   Patient reports no fear of animals YES   Patient reports no history of cruelty to animals YES   Patient understands his/her participation is voluntary YES   Patient washes hands before animal contact YES   Patient washes hands after animal contact YES   Behavioral Response: Engaged  Education: Contractor, Appropriate Animal Interaction   Education Outcome: Acknowledges understanding/In group clarification offered/Needs additional education.   Clinical Observations/Feedback: Pt did not attend group.    [name], LRT/CTRS         [name] A 05/11/2017 4:01 PM

## 2017-05-11 NOTE — Plan of Care (Signed)
Nurse discussed depression, anxiety, coping skills with patient.  
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
## 2017-05-11 NOTE — BHH Group Notes (Signed)
[facility] Group Notes:  (Nursing/MHT/Case Management/Adjunct)  Date:  05/11/2017  Time:  3:15   Type of Therapy:  Psychoeducational Skills  Participation Level:  Did Not Attend  Participation Quality:    Affect:    Cognitive:    Insight:    Engagement in Group:    Modes of Intervention:    Summary of Progress/Problems:  Chad Morgan 05/11/2017, 4:15 PM

## 2017-05-11 NOTE — Progress Notes (Signed)
D:  Patient denied SI and HI, contracts for safety.  Denied A/V hallucinations.   A:  Medications administered per MD orders.  Emotional support and encouragement given patient. R:  Safety maintained with 15 minute checks. Patient has stay in bed most of the day.  Has not attended groups. Patient did go to dining room for dinner this afternoon.

## 2017-05-11 NOTE — Progress Notes (Signed)
D: Pt denies SI/HI/AV halllucinations. Pt has tremors and reports some nausea and sweats. Patient was observed in bed most of shift. He was given fluids and monitored for increase in symptoms of withdrawal. A: Pt was offered support and encouragement. Pt was given scheduled medications. Pt was encourage to attend groups. Q 15 minute checks were done for safety.  R: Pt is taking medication. Pt has no complaints.Pt receptive to treatment and safety maintained on unit.

## 2017-05-11 NOTE — Progress Notes (Signed)
Patient ID: Chad Morgan, male   DOB: 1980/10/11, 37 y.o.   MRN: [id number]  Pt currently presents with a flat affect and depressed behavior. Pt reports to writer concerns of relapse on alcohol post discharge. Pt remains tremulous and exhibits other signs of withdrawal including anxiety, agitation and insomnia. Pt states "I am planning to move to a place away from the liquor store and gas station I normally walk to." SO visited tonight, reports that she is a sober support person for him. Pt reports good sleep with current medication regimen.   Pt provided with medications per providers orders. Pt's labs and vitals were monitored throughout the night. Pt given a 1:1 about emotional and mental status. Pt supported and encouraged to express concerns and questions. Pt educated on medications, substance abuse and behavior modification. Verbal understanding expressed.   Pt's safety ensured with 15 minute and environmental checks. Pt currently denies SI/HI and A/V hallucinations. Pt verbally agrees to seek staff if SI/HI or A/VH occurs and to consult with staff before acting on any harmful thoughts. Will continue POC.

## 2017-05-12 DIAGNOSIS — F1390 Sedative, hypnotic, or anxiolytic use, unspecified, uncomplicated: Secondary | ICD-10-CM

## 2017-05-12 DIAGNOSIS — F10230 Alcohol dependence with withdrawal, uncomplicated: Secondary | ICD-10-CM

## 2017-05-12 DIAGNOSIS — F1290 Cannabis use, unspecified, uncomplicated: Secondary | ICD-10-CM

## 2017-05-12 MED ORDER — NICOTINE 21 MG/24HR TD PT24
21.0000 mg | MEDICATED_PATCH | Freq: Every day | TRANSDERMAL | Status: DC
  Administered 2017-05-12 – 2017-05-13 (×2): 21 mg via TRANSDERMAL
  Filled 2017-05-12 (×2): qty 1

## 2017-05-12 NOTE — Progress Notes (Signed)
Pt attended goals group today and participated. Pt did not have a goal for the day

## 2017-05-12 NOTE — Progress Notes (Signed)
Recreation Therapy Notes  Date: 5.8.19 Time: 0930 Location: [address] Dayroom  Group Topic: Stress Management  Goal Area(s) Addresses:  Patient will verbalize importance of using healthy stress management.  Patient will identify positive emotions associated with healthy stress management.   Intervention: Stress Management  Activity :  Meditation.  LRT introduced the stress management technique of meditation.  LRT played a meditation that focused on the strength and resilience of mountains and how those characteristics can be used in daily life.  Patients were to follow along as meditation played.  Education:  Stress Management, Discharge Planning.   Education Outcome: Acknowledges edcuation/In group clarification offered/Needs additional education  Clinical Observations/Feedback: Pt did not attend group.      Rhealynn Myhre Linday, LRT/CTRS         [name] A 05/12/2017 11:43 AM

## 2017-05-12 NOTE — Progress Notes (Signed)
[facility] MD Progress Note  05/12/2017 2:49 PM Chad Morgan  MRN:  [id number] Subjective:  "Feeling a lot better", smiling and engaging, interacting with peers and staff, attending groups.  He has some fine tremors from alcohol withdrawal but is eating and sleeping now.  Chad Morgan is excited about going to a halfway house after discharge to continue his sobriety.  He feels this extra support with help him be successful.  Reports "low level" of depression, smiling, appetite and sleep are "good". Principal Problem: Major depressive disorder, recurrent, severe without psychotic features ([place]) Diagnosis:   Patient Active Problem List   Diagnosis Date Noted  . Major depressive disorder, recurrent, severe without psychotic features ([place]) [F33.2]     Priority: High  . Alcohol dependence with withdrawal, uncomplicated ([place]) [Z56.387] 04/23/2014    Priority: High  . MDD (major depressive disorder), recurrent severe, without psychosis ([place]) [F33.2] 05/10/2017  . MDD (major depressive disorder), severe ([place]) [F32.2] 03/19/2017  . Severe episode of recurrent major depressive disorder, without psychotic features ([place]) [F33.2]   . Alcohol use disorder, severe, dependence ([place]) [F10.20] 05/20/2015  . Alcohol dependence with withdrawal with complication ([place]) [F64.332]   . PTSD (post-traumatic stress disorder) [F43.10] 04/25/2014  . Severe major depression without psychotic features ([place]) [F32.2] 04/24/2014  . Suicidal ideations [R45.851]   . Alcohol dependence with intoxication with complication ([place]) [R51.884] 04/23/2014  . Tobacco abuse [Z72.0] 04/16/2014  . Alcohol withdrawal ([place]) [F10.239] 04/16/2014  . Alcohol dependence with uncomplicated withdrawal ([place]) [F10.230]   . Major depressive disorder, recurrent episode, moderate ([name]) [F33.1]   . Social anxiety disorder [F40.10] 01/09/2014  . Panic attacks [F41.0] 01/09/2014  . GAD (generalized anxiety disorder) [F41.1] 01/09/2014  . Alcohol dependence ([place])  [F10.20] 01/08/2014   Total Time spent with patient: 30 minutes  Past Psychiatric History: alcohol dependence, depression, PTSD  Past Medical History:  Past Medical History:  Diagnosis Date  . Alcohol abuse   . Anxiety   . Depression   . PTSD (post-traumatic stress disorder)   . Seizures ([place])    with withdrawal    Past Surgical History:  Procedure Laterality Date  . WISDOM TOOTH EXTRACTION     Family History:  Family History  Problem Relation Age of Onset  . Alcoholism Father    Family Psychiatric  History: father with alcoholism Social History:  Social History   Substance and Sexual Activity  Alcohol Use Yes   Comment:  1/2 gallon of liquor daily     Social History   Substance and Sexual Activity  Drug Use Yes  . Types: Benzodiazepines, Marijuana   Comment: Patient denies     Social History   Socioeconomic History  . Marital status: Legally Separated    Spouse name: Not on file  . Number of children: Not on file  . Years of education: Not on file  . Highest education level: Not on file  Occupational History  . Not on file  Social Needs  . Financial resource strain: Not on file  . Food insecurity:    Worry: Not on file    Inability: Not on file  . Transportation needs:    Medical: Not on file    Non-medical: Not on file  Tobacco Use  . Smoking status: Current Every Day Smoker    Packs/day: 1.00    Years: 16.00    Pack years: 16.00    Types: Cigarettes  . Smokeless tobacco: Never Used  Substance and Sexual Activity  . Alcohol use:  Yes    Comment:  1/2 gallon of liquor daily  . Drug use: Yes    Types: Benzodiazepines, Marijuana    Comment: Patient denies   . Sexual activity: Yes  Lifestyle  . Physical activity:    Days per week: Not on file    Minutes per session: Not on file  . Stress: Not on file  Relationships  . Social connections:    Talks on phone: Not on file    Gets together: Not on file    Attends religious service: Not on file     Active member of club or organization: Not on file    Attends meetings of clubs or organizations: Not on file    Relationship status: Not on file  Other Topics Concern  . Not on file  Social History Narrative  . Not on file   Additional Social History:                         Sleep: Good  Appetite:  Good  Current Medications: Current Facility-Administered Medications  Medication Dose Route Frequency Provider Last Rate Last Dose  . acetaminophen (TYLENOL) tablet 650 mg  650 mg Oral Q6H PRN Ethelene Hal, NP   650 mg at 05/11/17 8657  . alum & mag hydroxide-simeth (MAALOX/MYLANTA) 200-200-20 MG/5ML suspension 30 mL  30 mL Oral Q4H PRN Ethelene Hal, NP      . FLUoxetine (PROZAC) capsule 20 mg  20 mg Oral Daily Sharma Covert, MD   20 mg at 05/12/17 0824  . gabapentin (NEURONTIN) capsule 300 mg  300 mg Oral TID [name], NP   300 mg at 05/12/17 1112  . hydrOXYzine (ATARAX/VISTARIL) tablet 50 mg  50 mg Oral TID PRN Ethelene Hal, NP      . LORazepam (ATIVAN) tablet 1 mg  1 mg Oral Q6H PRN Ethelene Hal, NP   1 mg at 05/11/17 2144  . LORazepam (ATIVAN) tablet 1 mg  1 mg Oral BID Ethelene Hal, NP       Followed by  . [START ON 05/14/2017] LORazepam (ATIVAN) tablet 1 mg  1 mg Oral Daily Ethelene Hal, NP      . magnesium hydroxide (MILK OF MAGNESIA) suspension 30 mL  30 mL Oral Daily PRN Ethelene Hal, NP      . multivitamin with minerals tablet 1 tablet  1 tablet Oral Daily Ethelene Hal, NP   1 tablet at 05/12/17 8469  . naltrexone (DEPADE) tablet 25 mg  25 mg Oral Daily Sharma Covert, MD   25 mg at 05/12/17 6295  . nicotine (NICODERM CQ - dosed in mg/24 hours) patch 21 mg  21 mg Transdermal Daily Sharma Covert, MD   21 mg at 05/12/17 1112  . nicotine polacrilex (NICORETTE) gum 2 mg  2 mg Oral PRN Laverle Hobby, PA-C   2 mg at 05/11/17 2205  . ondansetron (ZOFRAN-ODT) disintegrating tablet  4 mg  4 mg Oral Q6H PRN Ethelene Hal, NP   4 mg at 05/11/17 0920  . thiamine (VITAMIN B-1) tablet 100 mg  100 mg Oral Daily Ethelene Hal, NP   100 mg at 05/12/17 2841  . traZODone (DESYREL) tablet 100 mg  100 mg Oral QHS PRN [name], NP   100 mg at 05/11/17 2144    Lab Results: No results found for this or any previous visit (from the  past 48 hour(s)).  Blood Alcohol level:  Lab Results  Component Value Date   ETH 344 ([place]) 05/10/2017   ETH 370 ([facility]) 01/00/7121    Metabolic Disorder Labs: No results found for: HGBA1C, MPG No results found for: PROLACTIN No results found for: CHOL, TRIG, HDL, CHOLHDL, VLDL, LDLCALC  Physical Findings: AIMS: Facial and Oral Movements Muscles of Facial Expression: None, normal Lips and Perioral Area: None, normal Jaw: None, normal Tongue: None, normal,Extremity Movements Upper (arms, wrists, hands, fingers): None, normal Lower (legs, knees, ankles, toes): None, normal, Trunk Movements Neck, shoulders, hips: None, normal, Overall Severity Severity of abnormal movements (highest score from questions above): None, normal Incapacitation due to abnormal movements: None, normal Patient's awareness of abnormal movements (rate only patient's report): No Awareness, Dental Status Current problems with teeth and/or dentures?: No Does patient usually wear dentures?: No  CIWA:  CIWA-Ar Total: 0 COWS:  COWS Total Score: 6  Musculoskeletal: Strength & Muscle Tone: within normal limits Gait & Station: normal Patient leans: N/A  Psychiatric Specialty Exam: Physical Exam  Constitutional: He is oriented to person, place, and time. He appears well-developed and well-nourished.  HENT:  Head: Normocephalic.  Neck: Normal range of motion.  Respiratory: Effort normal.  Musculoskeletal: Normal range of motion.  Neurological: He is alert and oriented to person, place, and time.  Psychiatric: His speech is normal and behavior is normal.  Judgment and thought content normal. Cognition and memory are normal. He exhibits a depressed mood.    Review of Systems  Psychiatric/Behavioral: Positive for depression and substance abuse.  All other systems reviewed and are negative.   Blood pressure 132/82, pulse (!) 114, temperature 98.6 F (37 C), temperature source Oral, resp. rate 18, height 6\' 2"  (1.88 m), weight 85.7 kg (189 lb).Body mass index is 24.27 kg/m.  General Appearance: Casual  Eye Contact:  Good  Speech:  Normal Rate  Volume:  Normal  Mood:  Depressed  Affect:  Congruent  Thought Process:  Coherent and Descriptions of Associations: Intact  Orientation:  Full (Time, Place, and Person)  Thought Content:  WDL and Logical  Suicidal Thoughts:  No  Homicidal Thoughts:  No  Memory:  Immediate;   Fair Recent;   Fair Remote;   Fair  Judgement:  Fair  Insight:  Good  Psychomotor Activity:  Normal  Concentration:  Concentration: Fair and Attention Span: Fair  Recall:  [employer] of Knowledge:  Fair  Language:  Good  Akathisia:  No  Handed:  Right  AIMS (if indicated):     Assets:  Leisure Time Physical Health Resilience Social Support  ADL's:  Intact  Cognition:  WNL  Sleep:  Number of Hours: 6.75   Treatment Plan Summary: Major depressive disorder, recurrent, severe without psychosis: -Continue Prozac 20 mg daily for depression  Substance abuse -Ativan alcohol/benzo/barbs detox protocol in place -Continue Gabapentin 300 mg TID for withdrawal symptoms -Naltrexone 25 mg daily for alcohol dependence  Insomnia -Trazodone 100 mg at bedtime PRN sleep  Anxiety -Hydroxyzine 50 mg TID PRN anxiety  -Continue group and individual therapy -Continue discharge plans  Waylan Boga, NP 05/12/2017, 2:49 PM

## 2017-05-12 NOTE — Tx Team (Signed)
Interdisciplinary Treatment and Diagnostic Plan Update  05/12/2017 Time of Session: 0830AM Chad Morgan MRN: [id number]  Principal Diagnosis: Major depressive disorder, recurrent, severe without psychotic features ([name])  Secondary Diagnoses: Principal Problem:   Major depressive disorder, recurrent, severe without psychotic features ([name]) Active Problems:   Alcohol dependence with withdrawal, uncomplicated ([name])   MDD (major depressive disorder), recurrent severe, without psychosis ([name])   Current Medications:  Current Facility-Administered Medications  Medication Dose Route Frequency Provider Last Rate Last Dose  . acetaminophen (TYLENOL) tablet 650 mg  650 mg Oral Q6H PRN Ethelene Hal, NP   650 mg at 05/11/17 4098  . alum & mag hydroxide-simeth (MAALOX/MYLANTA) 200-200-20 MG/5ML suspension 30 mL  30 mL Oral Q4H PRN Ethelene Hal, NP      . FLUoxetine (PROZAC) capsule 20 mg  20 mg Oral Daily Sharma Covert, MD   20 mg at 05/12/17 0824  . gabapentin (NEURONTIN) capsule 300 mg  300 mg Oral TID [name], NP   300 mg at 05/12/17 1191  . hydrOXYzine (ATARAX/VISTARIL) tablet 50 mg  50 mg Oral TID PRN Ethelene Hal, NP      . LORazepam (ATIVAN) tablet 1 mg  1 mg Oral Q6H PRN Ethelene Hal, NP   1 mg at 05/11/17 2144  . LORazepam (ATIVAN) tablet 1 mg  1 mg Oral BID Ethelene Hal, NP       Followed by  . [START ON 05/14/2017] LORazepam (ATIVAN) tablet 1 mg  1 mg Oral Daily Ethelene Hal, NP      . magnesium hydroxide (MILK OF MAGNESIA) suspension 30 mL  30 mL Oral Daily PRN Ethelene Hal, NP      . multivitamin with minerals tablet 1 tablet  1 tablet Oral Daily Ethelene Hal, NP   1 tablet at 05/12/17 4782  . naltrexone (DEPADE) tablet 25 mg  25 mg Oral Daily Sharma Covert, MD   25 mg at 05/12/17 9562  . nicotine polacrilex (NICORETTE) gum 2 mg  2 mg Oral PRN Patriciaann Clan E, PA-C   2 mg at 05/11/17 2205  .  ondansetron (ZOFRAN-ODT) disintegrating tablet 4 mg  4 mg Oral Q6H PRN Ethelene Hal, NP   4 mg at 05/11/17 0920  . thiamine (VITAMIN B-1) tablet 100 mg  100 mg Oral Daily Ethelene Hal, NP   100 mg at 05/12/17 1308  . traZODone (DESYREL) tablet 100 mg  100 mg Oral QHS PRN [name], NP   100 mg at 05/11/17 2144   PTA Medications: Medications Prior to Admission  Medication Sig Dispense Refill Last Dose  . FLUoxetine (PROZAC) 20 MG capsule Take 1 capsule (20 mg total) by mouth daily. For mood control (Patient not taking: Reported on 05/10/2017) 30 capsule 0 Not Taking at Unknown time  . traZODone (DESYREL) 50 MG tablet Take 1 tablet (50 mg total) by mouth at bedtime as needed for sleep. (Patient not taking: Reported on 05/10/2017) 30 tablet 0 Not Taking at Unknown time    Patient Stressors: Marital or family conflict Medication change or noncompliance Substance abuse  Patient Strengths: Ability for insight Average or above average intelligence Capable of independent living General fund of knowledge Motivation for treatment/growth  Treatment Modalities: Medication Management, Group therapy, Case management,  1 to 1 session with clinician, Psychoeducation, Recreational therapy.   Physician Treatment Plan for Primary Diagnosis: Major depressive disorder, recurrent, severe without psychotic features ([name]) Long Term Goal(s):  Improvement in symptoms so as ready for discharge Improvement in symptoms so as ready for discharge   Short Term Goals: Ability to identify changes in lifestyle to reduce recurrence of condition will improve Ability to verbalize feelings will improve Ability to disclose and discuss suicidal ideas Ability to demonstrate self-control will improve Ability to identify and develop effective coping behaviors will improve Ability to maintain clinical measurements within normal limits will improve Compliance with prescribed medications will improve Ability  to identify triggers associated with substance abuse/mental health issues will improve Ability to identify changes in lifestyle to reduce recurrence of condition will improve Ability to verbalize feelings will improve Ability to disclose and discuss suicidal ideas Ability to demonstrate self-control will improve Ability to identify and develop effective coping behaviors will improve Ability to maintain clinical measurements within normal limits will improve Compliance with prescribed medications will improve Ability to identify triggers associated with substance abuse/mental health issues will improve  Medication Management: Evaluate patient's response, side effects, and tolerance of medication regimen.  Therapeutic Interventions: 1 to 1 sessions, Unit Group sessions and Medication administration.  Evaluation of Outcomes: Progressing  Physician Treatment Plan for Secondary Diagnosis: Principal Problem:   Major depressive disorder, recurrent, severe without psychotic features ([name]) Active Problems:   Alcohol dependence with withdrawal, uncomplicated ([name])   MDD (major depressive disorder), recurrent severe, without psychosis ([name])  Long Term Goal(s): Improvement in symptoms so as ready for discharge Improvement in symptoms so as ready for discharge   Short Term Goals: Ability to identify changes in lifestyle to reduce recurrence of condition will improve Ability to verbalize feelings will improve Ability to disclose and discuss suicidal ideas Ability to demonstrate self-control will improve Ability to identify and develop effective coping behaviors will improve Ability to maintain clinical measurements within normal limits will improve Compliance with prescribed medications will improve Ability to identify triggers associated with substance abuse/mental health issues will improve Ability to identify changes in lifestyle to reduce recurrence of condition will improve Ability to  verbalize feelings will improve Ability to disclose and discuss suicidal ideas Ability to demonstrate self-control will improve Ability to identify and develop effective coping behaviors will improve Ability to maintain clinical measurements within normal limits will improve Compliance with prescribed medications will improve Ability to identify triggers associated with substance abuse/mental health issues will improve     Medication Management: Evaluate patient's response, side effects, and tolerance of medication regimen.  Therapeutic Interventions: 1 to 1 sessions, Unit Group sessions and Medication administration.  Evaluation of Outcomes: Progressing   RN Treatment Plan for Primary Diagnosis: Major depressive disorder, recurrent, severe without psychotic features ([place]) Long Term Goal(s): Knowledge of disease and therapeutic regimen to maintain health will improve  Short Term Goals: Ability to remain free from injury will improve, Ability to verbalize feelings will improve and Ability to disclose and discuss suicidal ideas  Medication Management: RN will administer medications as ordered by provider, will assess and evaluate patient's response and provide education to patient for prescribed medication. RN will report any adverse and/or side effects to prescribing provider.  Therapeutic Interventions: 1 on 1 counseling sessions, Psychoeducation, Medication administration, Evaluate responses to treatment, Monitor vital signs and CBGs as ordered, Perform/monitor CIWA, COWS, AIMS and Fall Risk screenings as ordered, Perform wound care treatments as ordered.  Evaluation of Outcomes: Progressing   LCSW Treatment Plan for Primary Diagnosis: Major depressive disorder, recurrent, severe without psychotic features ([place]) Long Term Goal(s): Safe transition to appropriate next level of care at discharge,  Engage patient in therapeutic group addressing interpersonal concerns.  Short Term Goals:  Engage patient in aftercare planning with referrals and resources, Facilitate acceptance of mental health diagnosis and concerns, Facilitate patient progression through stages of change regarding substance use diagnoses and concerns and Identify triggers associated with mental health/substance abuse issues  Therapeutic Interventions: Assess for all discharge needs, 1 to 1 time with Social worker, Explore available resources and support systems, Assess for adequacy in community support network, Educate family and significant other(s) on suicide prevention, Complete Psychosocial Assessment, Interpersonal group therapy.  Evaluation of Outcomes: Progressing   Progress in Treatment: Attending groups: Yes. Participating in groups: Yes. Taking medication as prescribed: Yes. Toleration medication: Yes. Family/Significant other contact made: Yes, individual(s) contacted:  pt's girlfriend Patient understands diagnosis: Yes. Discussing patient identified problems/goals with staff: Yes. Medical problems stabilized or resolved: Yes. Denies suicidal/homicidal ideation: Yes. Issues/concerns per patient self-inventory: No. Other: n/a   New problem(s) identified: No, Describe:  n/a  New Short Term/Long Term Goal(s): detox, medication management for mood stabilization; elimination of SI thoughts; development of comprehensive mental wellness/sobriety plan.   Patient Goal: "to get back on medication and detox from alcohol."   Discharge Plan or Barriers: Pt has interview with Clearnce Sorrel from [facility]; provided with [employer]. [facility] appt made. AA information and [place] pamphlet provided for additional community support.   Reason for Continuation of Hospitalization: Anxiety Depression Medication stabilization Withdrawal symptoms  Estimated Length of Stay: Friday, 05/14/17  Attendees: Patient: Chad Morgan 05/12/2017 8:58 AM  Physician: Dr. Mallie Darting MD; Dr. Nancy Fetter MD 05/12/2017  8:58 AM  Nursing: Mary Sella RN; Santiago Glad RN 05/12/2017 8:58 AM  RN Care Manager:x 05/12/2017 8:58 AM  Social Worker: Maxie Better, LCSW 05/12/2017 8:58 AM  Recreational Therapist: x 05/12/2017 8:58 AM  Other: Lindell Spar NP; Waylan Boga NP 05/12/2017 8:58 AM  Other:  05/12/2017 8:58 AM  Other: 05/12/2017 8:58 AM    Scribe for Treatment Team: [place], LCSW 05/12/2017 8:58 AM

## 2017-05-12 NOTE — [facility] Group Notes (Signed)
LCSW Group Therapy Note 05/12/2017 12:59 PM  Type of Therapy/Topic: Group Therapy: Emotion Regulation  Participation Level: Active   Description of Group:  The purpose of this group is to assist patients in learning to regulate negative emotions and experience positive emotions. Patients will be guided to discuss ways in which they have been vulnerable to their negative emotions. These vulnerabilities will be juxtaposed with experiences of positive emotions or situations, and patients will be challenged to use positive emotions to combat negative ones. Special emphasis will be placed on coping with negative emotions in conflict situations, and patients will process healthy conflict resolution skills.  Therapeutic Goals: 1. Patient will identify two positive emotions or experiences to reflect on in order to balance out negative emotions 2. Patient will label two or more emotions that they find the most difficult to experience 3. Patient will demonstrate positive conflict resolution skills through discussion and/or role plays  Summary of Patient Progress:  Edmar was engaged and participated throughout the group session. Makhari reports that emotional regulation has been a challenge for him. Auguste reports that he when he drinks alcohol, he is unable to control his emotions and reactions. Bohden reports that he wants to work on his anger management while in the hospital.    Therapeutic Modalities:  Cognitive Behavioral Therapy Feelings Identification Dialectical Behavioral Therapy   [place] Social Worker

## 2017-05-12 NOTE — Progress Notes (Signed)
Patient ID: Chad Morgan, male   DOB: 10/03/1980, 37 y.o.   MRN: [id number]  Pt currently presents with a brighter affect and cooperative behavior. Pt reports to Probation officer that their goal is to "go to a sober living house and work study." Pt states "I just can't go home right now and by by myself." Reports ongoing cravings for alcohol, present but decreased symptoms of withdrawal including tremors, anxiety and agitation. Pt reports good sleep with current medication regimen.   Pt provided with scheduled and as needed medications per providers orders. Pt's labs and vitals were monitored throughout the night. Pt given a 1:1 about emotional and mental status. Pt supported and encouraged to express concerns and questions. Pt educated on medications.  Pt's safety ensured with 15 minute and environmental checks. Pt currently denies SI/HI and A/V hallucinations. Pt verbally agrees to seek staff if SI/HI or A/VH occurs and to consult with staff before acting on any harmful thoughts. Will continue POC.

## 2017-05-12 NOTE — Plan of Care (Signed)
D: Chad Morgan says he is feeling much better today. He's less tremulous and reports no other S&S of withdrawal. He reports that his mood has improved, too. On his self inventory form, however, he rated his depression 5/10 and anxiety 7/10. He reported good sleep, fair appetite, low energy level, and good concentration. He's been present in the milieu and interacting well with peers. He says he's looking forward to his discharge.   A: Meds given as ordered. No PRNs requested or given. Q15 safety checks maintained. Support/encouragement offered.  R: Pt remains free from harm and continues with treatment. Will continue to monitor for needs/safety.   Problem: Education: Goal: Emotional status will improve Outcome: Progressing Goal: Mental status will improve Outcome: Progressing Goal: Verbalization of understanding the information provided will improve Outcome: Progressing   Problem: Activity: Goal: Interest or engagement in activities will improve Outcome: Progressing Goal: Sleeping patterns will improve Outcome: Progressing   Problem: Safety: Goal: Ability to disclose and discuss suicidal ideas will improve Outcome: Progressing   Problem: Coping: Goal: Ability to demonstrate self-control will improve Outcome: Adequate for Discharge   Problem: Physical Regulation: Goal: Ability to maintain clinical measurements within normal limits will improve Outcome: Completed/Met   Problem: Safety: Goal: Periods of time without injury will increase Outcome: Completed/Met   Problem: Safety: Goal: Ability to remain free from injury will improve Outcome: Completed/Met   Problem: Health Behavior/Discharge Planning: Goal: Compliance with therapeutic regimen will improve Outcome: Completed/Met   Problem: Coping: Goal: Ability to demonstrate self-control will improve Outcome: Adequate for Discharge

## 2017-05-13 MED ORDER — TRAZODONE HCL 100 MG PO TABS: 100.0000 mg | ORAL_TABLET | Freq: Every evening | ORAL | 0 refills | Status: DC | PRN

## 2017-05-13 MED ORDER — FLUOXETINE HCL 20 MG PO CAPS: 20.0000 mg | ORAL_CAPSULE | Freq: Every day | ORAL | 0 refills | Status: DC

## 2017-05-13 MED ORDER — NALTREXONE HCL 50 MG PO TABS: 25.0000 mg | ORAL_TABLET | Freq: Every day | ORAL | 0 refills | Status: DC

## 2017-05-13 MED ORDER — GABAPENTIN 300 MG PO CAPS: 300.0000 mg | ORAL_CAPSULE | Freq: Three times a day (TID) | ORAL | 0 refills | Status: DC

## 2017-05-13 NOTE — Progress Notes (Signed)
  [facility] Adult Case Management Discharge Plan :  Will you be returning to the same living situation after discharge:  No. Pt plans to enter [facility] halfway house.  At discharge, do you have transportation home?: Yes,  bus pass Do you have the ability to pay for your medications: Yes,  mental health  Release of information consent forms completed and submitted to medical records by CSW.  Patient to Follow up at: Follow-up Information    [facility] Follow up on 05/18/2017.   Specialty:  Behavioral Health Why:  Hospital follow-up on Tuesday, 5/14 at 8:00AM. Please bring: photo ID, social security card, and any proof of income if you have it. Thank you.  Contact information: [place]  [address] [phone]           Next level of care provider has access to [place] and Suicide Prevention discussed: Yes,  SPE completed with pt's girlfriend. SPI pamphlet and Mobile Crisis information also provided to pt.   Have you used any form of tobacco in the last 30 days? (Cigarettes, Smokeless Tobacco, Cigars, and/or Pipes): Yes  Has patient been referred to the Quitline?: Patient refused referral  Patient has been referred for addiction treatment: Yes  [employer], LCSW 05/13/2017, 8:59 AM

## 2017-05-13 NOTE — Progress Notes (Signed)
Patient did attend the evening speaker NA meeting.  

## 2017-05-13 NOTE — Progress Notes (Signed)
Discharge Note:  Patient discharged with bus pass.    Patient denied SI and HI.  Denied A/V hallucinations.  Suicide prevention information given and discussed with patient who stated he understood and had no questions.  Patient stated he received all his belongings, clothing, toiletries, misc items, prescriptions, medications, etc.  Patient stated he appreciated all assistance received from [facility] staff.  All required discharge information given to patient at discharge.

## 2017-05-13 NOTE — BHH Suicide Risk Assessment (Signed)
[facility] Discharge Suicide Risk Assessment   Principal Problem: Major depressive disorder, recurrent, severe without psychotic features [facility]) Discharge Diagnoses:  Patient Active Problem List   Diagnosis Date Noted  . MDD (major depressive disorder), recurrent severe, without psychosis ([place]) [F33.2] 05/10/2017  . MDD (major depressive disorder), severe ([place]) [F32.2] 03/19/2017  . Severe episode of recurrent major depressive disorder, without psychotic features ([place]) [F33.2]   . Alcohol use disorder, severe, dependence ([place]) [F10.20] 05/20/2015  . Alcohol dependence with withdrawal with complication ([place]) [W96.759]   . Major depressive disorder, recurrent, severe without psychotic features ([place]) [F33.2]   . PTSD (post-traumatic stress disorder) [F43.10] 04/25/2014  . Severe major depression without psychotic features ([place]) [F32.2] 04/24/2014  . Suicidal ideations [R45.851]   . Alcohol dependence with withdrawal, uncomplicated ([place]) [F63.846] 04/23/2014  . Alcohol dependence with intoxication with complication ([place]) [K59.935] 04/23/2014  . Tobacco abuse [Z72.0] 04/16/2014  . Alcohol withdrawal ([place]) [F10.239] 04/16/2014  . Alcohol dependence with uncomplicated withdrawal ([place]) [F10.230]   . Major depressive disorder, recurrent episode, moderate ([name]) [F33.1]   . Social anxiety disorder [F40.10] 01/09/2014  . Panic attacks [F41.0] 01/09/2014  . GAD (generalized anxiety disorder) [F41.1] 01/09/2014  . Alcohol dependence ([place]) [F10.20] 01/08/2014    Total Time spent with patient: 30 minutes  Musculoskeletal: Strength & Muscle Tone: within normal limits Gait & Station: normal Patient leans: N/A  Psychiatric Specialty Exam: Review of Systems  All other systems reviewed and are negative.   Blood pressure 138/77, pulse 99, temperature 98.3 F (36.8 C), temperature source Oral, resp. rate 16, height 6\' 2"  (1.88 m), weight 85.7 kg (189 lb).Body mass index is 24.27 kg/m.  General Appearance:  Casual  Eye Contact::  Good  Speech:  Clear and Coherent409  Volume:  Normal  Mood:  Euthymic  Affect:  Congruent  Thought Process:  Coherent  Orientation:  Full (Time, Place, and Person)  Thought Content:  Logical  Suicidal Thoughts:  No  Homicidal Thoughts:  No  Memory:  Immediate;   Fair  Judgement:  Fair  Insight:  Fair  Psychomotor Activity:  Normal  Concentration:  Fair  Recall:  Good  Fund of Knowledge:Good  Language: Good  Akathisia:  No  Handed:  Right  AIMS (if indicated):     Assets:  Desire for Improvement  Sleep:  Number of Hours: 4.25  Cognition: WNL  ADL's:  Intact   Mental Status Per Nursing Assessment::   On Admission:     Demographic Factors:  Male, Caucasian, Low socioeconomic status and Unemployed  Loss Factors: NA  Historical Factors: Impulsivity  Risk Reduction Factors:   Positive coping skills or problem solving skills  Continued Clinical Symptoms:  Alcohol/Substance Abuse/Dependencies  Cognitive Features That Contribute To Risk:  None    Suicide Risk:  Minimal: No identifiable suicidal ideation.  Patients presenting with no risk factors but with morbid ruminations; may be classified as minimal risk based on the severity of the depressive symptoms  Follow-up Information    [facility] Follow up on 05/18/2017.   Specialty:  Behavioral Health Why:  Hospital follow-up on Tuesday, 5/14 at 8:00AM. Please bring: photo ID, social security card, and any proof of income if you have it. Thank you.  Contact information: [place] [address] [phone]           Plan Of Care/Follow-up recommendations:  Activity:  ad lib  Sharma Covert, MD 05/13/2017, 9:23 AM

## 2017-05-13 NOTE — Progress Notes (Signed)
D:  Patient's self inventory sheet, patient sleeps good, sleep medication helpful.  Good appetite, normal energy level, good concentration.  Denied depression, hopeless and anxiety.  Denied withdrawals.  Denied SI.  Denied physical problems.  Denied pain.  Goal is discharge and go to [facility].  Feels he is fully detoxed, denied SI and HI.  Ready for discharge. A:  Medications administered per MD orders.  Emotional support and encouragement given patient. R:  Denied SI and HI, contracts for safety.  Safety maintained with 15 minute checks.

## 2017-05-13 NOTE — BHH Group Notes (Signed)
Adult Psychoeducational Group Note  Date:  05/13/2017 Time:  9:30 AM  Group Topic/Focus:  Goals Group:   The focus of this group is to help patients establish daily goals to achieve during treatment and discuss how the patient can incorporate goal setting into their daily lives to aide in recovery.  Participation Level:  Active  Participation Quality:  Appropriate  Affect:  Appropriate  Cognitive:  Alert  Insight: Appropriate  Engagement in Group:  Engaged  Modes of Intervention:  Orientation  Additional Comments:  Pt attended and participated in orientation/goals group. Pt goal for today is to get discharged and go to a halfway house.   Huel Cote 05/13/2017, 9:30 AM

## 2017-05-13 NOTE — Discharge Summary (Signed)
Physician Discharge Summary Note  Patient:  Chad Morgan is an 37 y.o., male MRN:  [id number] DOB:  October 01, 1980 Patient phone:  [phone] (home)  Patient address:   [place] [address],  Total Time spent with patient: 20 minutes  Date of Admission:  05/10/2017 Date of Discharge: 05/13/17  Reason for Admission:  Worsening depression with SI  Principal Problem: Major depressive disorder, recurrent, severe without psychotic features [facility]) Discharge Diagnoses: Patient Active Problem List   Diagnosis Date Noted  . MDD (major depressive disorder), recurrent severe, without psychosis ([place]) [F33.2] 05/10/2017  . MDD (major depressive disorder), severe ([place]) [F32.2] 03/19/2017  . Severe episode of recurrent major depressive disorder, without psychotic features ([place]) [F33.2]   . Alcohol use disorder, severe, dependence ([place]) [F10.20] 05/20/2015  . Alcohol dependence with withdrawal with complication ([place]) [P95.093]   . Major depressive disorder, recurrent, severe without psychotic features ([place]) [F33.2]   . PTSD (post-traumatic stress disorder) [F43.10] 04/25/2014  . Severe major depression without psychotic features ([place]) [F32.2] 04/24/2014  . Suicidal ideations [R45.851]   . Alcohol dependence with withdrawal, uncomplicated ([place]) [O67.124] 04/23/2014  . Alcohol dependence with intoxication with complication ([place]) [P80.998] 04/23/2014  . Tobacco abuse [Z72.0] 04/16/2014  . Alcohol withdrawal ([place]) [F10.239] 04/16/2014  . Alcohol dependence with uncomplicated withdrawal ([place]) [F10.230]   . Major depressive disorder, recurrent episode, moderate ([name]) [F33.1]   . Social anxiety disorder [F40.10] 01/09/2014  . Panic attacks [F41.0] 01/09/2014  . GAD (generalized anxiety disorder) [F41.1] 01/09/2014  . Alcohol dependence ([place]) [F10.20] 01/08/2014    Past Psychiatric History: depression, alcohol dependence  Past Medical History:  Past Medical History:   Diagnosis Date  . Alcohol abuse   . Anxiety   . Depression   . PTSD (post-traumatic stress disorder)   . Seizures ([place])    with withdrawal    Past Surgical History:  Procedure Laterality Date  . WISDOM TOOTH EXTRACTION     Family History:  Family History  Problem Relation Age of Onset  . Alcoholism Father    Family Psychiatric  History: father-alcoholism  Social History:  Social History   Substance and Sexual Activity  Alcohol Use Yes   Comment:  1/2 gallon of liquor daily     Social History   Substance and Sexual Activity  Drug Use Yes  . Types: Benzodiazepines, Marijuana   Comment: Patient denies     Social History   Socioeconomic History  . Marital status: Legally Separated    Spouse name: Not on file  . Number of children: Not on file  . Years of education: Not on file  . Highest education level: Not on file  Occupational History  . Not on file  Social Needs  . Financial resource strain: Not on file  . Food insecurity:    Worry: Not on file    Inability: Not on file  . Transportation needs:    Medical: Not on file    Non-medical: Not on file  Tobacco Use  . Smoking status: Current Every Day Smoker    Packs/day: 1.00    Years: 16.00    Pack years: 16.00    Types: Cigarettes  . Smokeless tobacco: Never Used  Substance and Sexual Activity  . Alcohol use: Yes    Comment:  1/2 gallon of liquor daily  . Drug use: Yes    Types: Benzodiazepines, Marijuana    Comment: Patient denies   . Sexual activity: Yes  Lifestyle  .  Physical activity:    Days per week: Not on file    Minutes per session: Not on file  . Stress: Not on file  Relationships  . Social connections:    Talks on phone: Not on file    Gets together: Not on file    Attends religious service: Not on file    Active member of club or organization: Not on file    Attends meetings of clubs or organizations: Not on file    Relationship status: Not on file  Other Topics Concern  . Not  on file  Social History Narrative  . Not on file    Hospital Course:   05/11/17 [facility] MD Assessment: On admission:  37 y.o.malewho presents to the ED voluntarily accompanied by his girlfriend.Pt states he has been increasingly depressed due to his wife cheating on him and taking his children away. Pt states he continues to pay child support every month, however she does not allow him to see his children. Pt also states he has been drinking up to 1/2 a gallon of alcohol daily. Pt states he wants to slit his wrists in order to kill himself. Pt states he has attempted in the past by "putting a gun to my head." Pt states he "missed by an inch."  Pt has a hx of inpt admissions at [facility] c/o similar concerns. Pt states he used to receive services at the [facility] hospital however he stopped because he felt as though they only wanted to prescribe Xanax and not actually treat him.  Pt states he is not sleeping or eating. He states he feels hopeless. Pt also reported to the EDP that he would "cut my wrists." Pt denied having access to weapons to TTS however per chart he did report to the EDP that he has access to firearms. Today, he states he feels a "little better", still experiencing some nausea and tremors from withdrawing from alcohol and Xanax.  6/10 depression with no suicidal ideations, sleep was nonexistent and appetite is poor.  Encouraged to get out of bed when he feels better and increase fluid intake.  Patient remained on the [facility] unit for 2 days. The patient stabilized on medication and therapy. Patient was discharged on Prozac 20 mg Daily, Neurontin 300 TID, Vistaril 50 mg TID PRN, Naltrexone 25 mg Daily, and Trazodone 50 mg QHS PRN. Patient has shown improvement with improved mood, affect, sleep, appetite, and interaction. Patient has attended group and participated. Patient has been seen in the day room interacting with peers and staff appropriately. Patient denies any SI/HI/AVH and contracts for  safety. Patient agrees to follow up at [facility]. Patient is provided with prescriptions for their medications upon discharge.     Physical Findings: AIMS: Facial and Oral Movements Muscles of Facial Expression: None, normal Lips and Perioral Area: None, normal Jaw: None, normal Tongue: None, normal,Extremity Movements Upper (arms, wrists, hands, fingers): None, normal Lower (legs, knees, ankles, toes): None, normal, Trunk Movements Neck, shoulders, hips: None, normal, Overall Severity Severity of abnormal movements (highest score from questions above): None, normal Incapacitation due to abnormal movements: None, normal Patient's awareness of abnormal movements (rate only patient's report): No Awareness, Dental Status Current problems with teeth and/or dentures?: No Does patient usually wear dentures?: No  CIWA:  CIWA-Ar Total: 10 COWS:  COWS Total Score: 6  Musculoskeletal: Strength & Muscle Tone: within normal limits Gait & Station: normal Patient leans: N/A  Psychiatric Specialty Exam: Physical Exam  Nursing note  and vitals reviewed. Constitutional: He is oriented to person, place, and time. He appears well-developed and well-nourished.  Cardiovascular: Normal rate.  Respiratory: Effort normal.  Musculoskeletal: Normal range of motion.  Neurological: He is alert and oriented to person, place, and time.  Skin: Skin is warm.    Review of Systems  Constitutional: Negative.   HENT: Negative.   Eyes: Negative.   Respiratory: Negative.   Cardiovascular: Negative.   Gastrointestinal: Negative.   Genitourinary: Negative.   Musculoskeletal: Negative.   Skin: Negative.   Neurological: Negative.   Endo/Heme/Allergies: Negative.   Psychiatric/Behavioral: Negative.     Blood pressure 138/77, pulse 99, temperature 98.3 F (36.8 C), temperature source Oral, resp. rate 16, height 6\' 2"  (1.88 m), weight 85.7 kg (189 lb).Body mass index is 24.27 kg/m.  General Appearance: Casual   Eye Contact:  Good  Speech:  Clear and Coherent and Normal Rate  Volume:  Normal  Mood:  Euthymic  Affect:  Congruent  Thought Process:  Goal Directed and Descriptions of Associations: Intact  Orientation:  Full (Time, Place, and Person)  Thought Content:  WDL  Suicidal Thoughts:  No  Homicidal Thoughts:  No  Memory:  Immediate;   Good Recent;   Good Remote;   Good  Judgement:  Fair  Insight:  Good  Psychomotor Activity:  Normal  Concentration:  Concentration: Good and Attention Span: Good  Recall:  Good  Fund of Knowledge:  Good  Language:  Good  Akathisia:  No  Handed:  Right  AIMS (if indicated):     Assets:  Communication Skills Desire for Improvement Financial Resources/Insurance Social Support  ADL's:  Intact  Cognition:  WNL  Sleep:  Number of Hours: 4.25     Have you used any form of tobacco in the last 30 days? (Cigarettes, Smokeless Tobacco, Cigars, and/or Pipes): Yes  Has this patient used any form of tobacco in the last 30 days? (Cigarettes, Smokeless Tobacco, Cigars, and/or Pipes) Yes, Yes, A prescription for an FDA-approved tobacco cessation medication was offered at discharge and the patient refused  Blood Alcohol level:  Lab Results  Component Value Date   ETH 344 ([place]) 05/10/2017   ETH 370 ([place]) 07/37/1062    Metabolic Disorder Labs:  No results found for: HGBA1C, MPG No results found for: PROLACTIN No results found for: CHOL, TRIG, HDL, CHOLHDL, VLDL, LDLCALC  See Psychiatric Specialty Exam and Suicide Risk Assessment completed by Attending Physician prior to discharge.  Discharge destination:  Home  Is patient on multiple antipsychotic therapies at discharge:  No   Has Patient had three or more failed trials of antipsychotic monotherapy by history:  No  Recommended Plan for Multiple Antipsychotic Therapies: NA  Discharge Instructions    Diet - low sodium heart healthy   Complete by:  As directed    Discharge instructions   Complete by:   As directed    Discharge home   Increase activity slowly   Complete by:  As directed      Allergies as of 05/13/2017   No Known Allergies     Medication List    TAKE these medications     Indication  FLUoxetine 20 MG capsule Commonly known as:  PROZAC Take 1 capsule (20 mg total) by mouth daily. For mood control Start taking on:  05/14/2017  Indication:  mood stability   gabapentin 300 MG capsule Commonly known as:  NEURONTIN Take 1 capsule (300 mg total) by mouth 3 (three) times daily. For alcohol  withdrawal  Indication:  Alcohol Withdrawal Syndrome   hydrOXYzine 50 MG tablet Commonly known as:  ATARAX/VISTARIL Take 1 tablet (50 mg total) by mouth 3 (three) times daily as needed for anxiety.  Indication:  Feeling Anxious   naltrexone 50 MG tablet Commonly known as:  DEPADE Take 0.5 tablets (25 mg total) by mouth daily. For cravings Start taking on:  05/14/2017  Indication:  Excessive Use of Alcohol   traZODone 100 MG tablet Commonly known as:  DESYREL Take 1 tablet (100 mg total) by mouth at bedtime as needed for sleep. What changed:    medication strength  how much to take  Indication:  Trouble Sleeping      Follow-up Information    [facility] Follow up on 05/18/2017.   Specialty:  Behavioral Health Why:  Hospital follow-up on Tuesday, 5/14 at 8:00AM. Please bring: photo ID, social security card, and any proof of income if you have it. Thank you.  Contact information: [address] [place] [address] [phone]           Follow-up recommendations:  Continue activity as tolerated. Continue diet as recommended by your PCP. Ensure to keep all appointments with outpatient providers.  Comments:  Patient is instructed prior to discharge to: Take all medications as prescribed by his/her mental healthcare provider. Report any adverse effects and or reactions from the medicines to his/her outpatient provider promptly. Patient has been instructed & cautioned:  To not engage in alcohol and or illegal drug use while on prescription medicines. In the event of worsening symptoms, patient is instructed to call the crisis hotline, 911 and or go to the nearest ED for appropriate evaluation and treatment of symptoms. To follow-up with his/her primary care provider for your other medical issues, concerns and or health care needs.    Signed: Lowry Ram Marlin Jarrard, FNP 05/13/2017, 9:42 AM

## 2017-05-15 ENCOUNTER — Emergency Department (HOSPITAL_COMMUNITY): Payer: Self-pay

## 2017-05-15 ENCOUNTER — Encounter (HOSPITAL_COMMUNITY): Payer: Self-pay | Admitting: Emergency Medicine

## 2017-05-15 ENCOUNTER — Other Ambulatory Visit: Payer: Self-pay

## 2017-05-15 ENCOUNTER — Inpatient Hospital Stay (HOSPITAL_COMMUNITY)
Admission: EM | Admit: 2017-05-15 | Discharge: 2017-05-17 | DRG: 897 | Disposition: A | Discharge status: Home / Self Care | Payer: Self-pay | Attending: Internal Medicine | Admitting: Internal Medicine

## 2017-05-15 DIAGNOSIS — F10239 Alcohol dependence with withdrawal, unspecified: Secondary | ICD-10-CM

## 2017-05-15 DIAGNOSIS — F10230 Alcohol dependence with withdrawal, uncomplicated: Principal | ICD-10-CM | POA: Diagnosis present

## 2017-05-15 DIAGNOSIS — F17210 Nicotine dependence, cigarettes, uncomplicated: Secondary | ICD-10-CM | POA: Diagnosis present

## 2017-05-15 DIAGNOSIS — R945 Abnormal results of liver function studies: Secondary | ICD-10-CM | POA: Diagnosis present

## 2017-05-15 DIAGNOSIS — Z811 Family history of alcohol abuse and dependence: Secondary | ICD-10-CM

## 2017-05-15 DIAGNOSIS — C719 Malignant neoplasm of brain, unspecified: Secondary | ICD-10-CM | POA: Diagnosis present

## 2017-05-15 DIAGNOSIS — F1020 Alcohol dependence, uncomplicated: Secondary | ICD-10-CM | POA: Diagnosis present

## 2017-05-15 DIAGNOSIS — D649 Anemia, unspecified: Secondary | ICD-10-CM | POA: Diagnosis present

## 2017-05-15 DIAGNOSIS — F10939 Alcohol use, unspecified with withdrawal, unspecified: Secondary | ICD-10-CM

## 2017-05-15 DIAGNOSIS — F4310 Post-traumatic stress disorder, unspecified: Secondary | ICD-10-CM | POA: Diagnosis present

## 2017-05-15 DIAGNOSIS — R569 Unspecified convulsions: Secondary | ICD-10-CM

## 2017-05-15 DIAGNOSIS — X58XXXA Exposure to other specified factors, initial encounter: Secondary | ICD-10-CM | POA: Diagnosis present

## 2017-05-15 DIAGNOSIS — E876 Hypokalemia: Secondary | ICD-10-CM | POA: Diagnosis present

## 2017-05-15 DIAGNOSIS — F329 Major depressive disorder, single episode, unspecified: Secondary | ICD-10-CM | POA: Diagnosis present

## 2017-05-15 DIAGNOSIS — S01512A Laceration without foreign body of oral cavity, initial encounter: Secondary | ICD-10-CM | POA: Diagnosis present

## 2017-05-15 DIAGNOSIS — Z79899 Other long term (current) drug therapy: Secondary | ICD-10-CM

## 2017-05-15 DIAGNOSIS — F419 Anxiety disorder, unspecified: Secondary | ICD-10-CM | POA: Diagnosis present

## 2017-05-15 LAB — CBC
HEMATOCRIT: 36.1 % — AB (ref 39.0–52.0)
HEMOGLOBIN: 12.3 g/dL — AB (ref 13.0–17.0)
MCH: 33.5 pg (ref 26.0–34.0)
MCHC: 34.1 g/dL (ref 30.0–36.0)
MCV: 98.4 fL (ref 78.0–100.0)
Platelets: 152 10*3/uL (ref 150–400)
RBC: 3.67 MIL/uL — ABNORMAL LOW (ref 4.22–5.81)
RDW: 15.7 % — ABNORMAL HIGH (ref 11.5–15.5)
WBC: 9.7 10*3/uL (ref 4.0–10.5)

## 2017-05-15 LAB — COMPREHENSIVE METABOLIC PANEL
ALBUMIN: 3.8 g/dL (ref 3.5–5.0)
ALT: 92 U/L — ABNORMAL HIGH (ref 17–63)
AST: 73 U/L — AB (ref 15–41)
Alkaline Phosphatase: 35 U/L — ABNORMAL LOW (ref 38–126)
Anion gap: 13 (ref 5–15)
BILIRUBIN TOTAL: 0.6 mg/dL (ref 0.3–1.2)
BUN: 5 mg/dL — AB (ref 6–20)
CHLORIDE: 101 mmol/L (ref 101–111)
CO2: 22 mmol/L (ref 22–32)
Calcium: 8.9 mg/dL (ref 8.9–10.3)
Creatinine, Ser: 1.02 mg/dL (ref 0.61–1.24)
GFR calc Af Amer: >60 mL/min (ref >60)
GFR calc non Af Amer: >60 mL/min (ref >60)
GLUCOSE: 116 mg/dL — AB (ref 65–99)
Potassium: 3.2 mmol/L — ABNORMAL LOW (ref 3.5–5.1)
SODIUM: 136 mmol/L (ref 135–145)
TOTAL PROTEIN: 6.8 g/dL (ref 6.5–8.1)

## 2017-05-15 LAB — RAPID URINE DRUG SCREEN, HOSP PERFORMED
AMPHETAMINES: NOT DETECTED
BARBITURATES: NOT DETECTED
Benzodiazepines: POSITIVE — AB
Cocaine: NOT DETECTED
Opiates: NOT DETECTED
TETRAHYDROCANNABINOL: POSITIVE — AB

## 2017-05-15 LAB — I-STAT TROPONIN, ED: Troponin i, poc: 0.01 ng/mL (ref 0.00–0.08)

## 2017-05-15 LAB — ETHANOL: Alcohol, Ethyl (B): <10 mg/dL (ref <10)

## 2017-05-15 MED ORDER — POTASSIUM CHLORIDE CRYS ER 20 MEQ PO TBCR
40.0000 meq | EXTENDED_RELEASE_TABLET | Freq: Once | ORAL | Status: AC
  Administered 2017-05-15: 40 meq via ORAL
  Filled 2017-05-15: qty 2

## 2017-05-15 MED ORDER — LORAZEPAM 1 MG PO TABS
1.0000 mg | ORAL_TABLET | Freq: Once | ORAL | Status: AC
  Administered 2017-05-15: 1 mg via ORAL
  Filled 2017-05-15: qty 1

## 2017-05-15 NOTE — ED Triage Notes (Signed)
Pt presents by EMS for evaluation of possible seizures and reports that pt had been drinking heavily for the last several days and stop 3 days ago. EMS reports that pt hit head on sidewalk when sitting.

## 2017-05-15 NOTE — ED Provider Notes (Signed)
[facility] DEPT Provider Note   CSN: [id number] Arrival date & time: 05/15/17  2058     History   Chief Complaint Chief Complaint  Patient presents with  . Seizures  . Alcohol Intoxication    HPI Chad Morgan is a 37 y.o. male.  HPI 37 year old Caucasian male past medical history significant for alcohol abuse with withdrawal seizures, anxiety, depression, PTSD presents to the emergency department today for evaluation of seizure-like activity.  Patient states that he has been feeling tremulous throughout the day.  He also reports some jerking motions of his head today.  States that he was walking into an arcade with his son and wife when he felt "like his head was twitching really bad and he was not feeling right".  Patient states that he sat down and then reports that his wife said he started having shaking motion.  He reports biting the right side of his tongue.  He reports that his wife said that he was postictal after the incident.  Patient does not remember the incident.  Patient reports a mild headache with hematoma at this time but denies any vision changes, lightheadedness or dizziness.  Patient denies any associated neck pain.  Does report some left upper chest and back pain.  Unsure if this was from the fall.  Worse with palpation.  Denies any cardiac history.  Denies any history of PE/DVT, prolonged immobilization, recent surgeries, hemoptysis, hormone use, tobacco use.  Patient states that he was admitted to [facility] on 5/6 and discharged on 5/9 for suicidal ideations and alcohol detox.  Patient states that he has not had alcohol for the past 5 days.  States that he feels very tremulous today.  He does have a history of withdrawal seizures that have required admissions in the past.  Patient denies SI or HI.  Denies any auditory visual hallucinations.  Pt denies any fever, chill, ha, vision changes, lightheadedness, dizziness, congestion, neck pain, cp,  sob, cough, abd pain, n/v/d, urinary symptoms, change in bowel habits, melena, hematochezia, lower extremity paresthesias.   Past Medical History:  Diagnosis Date  . Alcohol abuse   . Anxiety   . Depression   . PTSD (post-traumatic stress disorder)   . Seizures ([place])    with withdrawal    Patient Active Problem List   Diagnosis Date Noted  . MDD (major depressive disorder), recurrent severe, without psychosis ([place]) 05/10/2017  . MDD (major depressive disorder), severe ([place]) 03/19/2017  . Severe episode of recurrent major depressive disorder, without psychotic features ([place])   . Alcohol use disorder, severe, dependence ([place]) 05/20/2015  . Alcohol dependence with withdrawal with complication ([place])   . Major depressive disorder, recurrent, severe without psychotic features ([place])   . PTSD (post-traumatic stress disorder) 04/25/2014  . Severe major depression without psychotic features ([place]) 04/24/2014  . Suicidal ideations   . Alcohol dependence with withdrawal, uncomplicated ([place]) 70/35/0093  . Alcohol dependence with intoxication with complication ([place]) 81/82/9937  . Tobacco abuse 04/16/2014  . Alcohol withdrawal ([place]) 04/16/2014  . Alcohol dependence with uncomplicated withdrawal ([place])   . Major depressive disorder, recurrent episode, moderate ([place])   . Social anxiety disorder 01/09/2014  . Panic attacks 01/09/2014  . GAD (generalized anxiety disorder) 01/09/2014  . Alcohol dependence ([place]) 01/08/2014    Past Surgical History:  Procedure Laterality Date  . WISDOM TOOTH EXTRACTION          Home Medications    Prior to Admission medications  Medication Sig Start Date End Date Taking? Authorizing Provider  diphenhydrAMINE (BENADRYL) 25 MG tablet Take 25 mg by mouth every 6 (six) hours as needed (shakiness).   Yes [provider]  FLUoxetine (PROZAC) 20 MG capsule Take 1 capsule (20 mg total) by mouth daily. For mood control 05/14/17   Money, Lowry Ram, FNP    gabapentin (NEURONTIN) 300 MG capsule Take 1 capsule (300 mg total) by mouth 3 (three) times daily. For alcohol withdrawal 05/13/17   Money, Lowry Ram, FNP  hydrOXYzine (ATARAX/VISTARIL) 50 MG tablet Take 1 tablet (50 mg total) by mouth 3 (three) times daily as needed for anxiety. 05/11/17   [name], NP  naltrexone (DEPADE) 50 MG tablet Take 0.5 tablets (25 mg total) by mouth daily. For cravings 05/14/17   Money, Lowry Ram, FNP  traZODone (DESYREL) 100 MG tablet Take 1 tablet (100 mg total) by mouth at bedtime as needed for sleep. 05/13/17   Money, Lowry Ram, FNP    Family History Family History  Problem Relation Age of Onset  . Alcoholism Father     Social History Social History   Tobacco Use  . Smoking status: Current Every Day Smoker    Packs/day: 1.00    Years: 16.00    Pack years: 16.00    Types: Cigarettes  . Smokeless tobacco: Never Used  Substance Use Topics  . Alcohol use: Yes    Comment:  1/2 gallon of liquor daily  . Drug use: Yes    Types: Benzodiazepines, Marijuana    Comment: Patient denies      Allergies   Patient has no known allergies.   Review of Systems Review of Systems  All other systems reviewed and are negative.    Physical Exam Updated Vital Signs BP 131/80   Pulse 96   Temp 98.1 F (36.7 C) (Oral)   Resp 12   Ht 6\' 2"  (1.88 m)   Wt 90.7 kg (200 lb)   SpO2 98%   BMI 25.68 kg/m   Physical Exam Physical Exam  Constitutional: Pt is oriented to person, place, and time. Appears well-developed and well-nourished. No distress.  HENT:  Head: Normocephalic.  Patient with hematoma to the left forehead. Ears: No bilateral hemotympanum. Nose: Nose normal. No septal hematoma. Mouth/Throat: Uvula is midline, oropharynx is clear and moist and mucous membranes are normal. Patient has ecchymosis to the right side of the tongue but no open wound requiring laceration repair. Eyes: Conjunctivae and EOM are normal. Pupils are equal, round, and reactive  to light.  Neck: No spinous process tenderness and no muscular tenderness present. No rigidity. Normal range of motion present.    Patient in C-spine precautions No midline cervical tenderness No crepitus, deformity or step-offs No paraspinal tenderness  Cardiovascular: Normal rate, regular rhythm and intact distal pulses.   Pulses:      Radial pulses are 2+ on the right side, and 2+ on the left side.       Dorsalis pedis pulses are 2+ on the right side, and 2+ on the left side.       Posterior tibial pulses are 2+ on the right side, and 2+ on the left side.  Pulmonary/Chest: Effort normal and breath sounds normal. No accessory muscle usage. No respiratory distress. No decreased breath sounds. No wheezes. No rhonchi. No rales.  Patient has mild tenderness to the left upper chest with palpation.  No obvious deformity or crepitus. No flail segment, crepitus or deformity Equal chest expansion  Abdominal: Soft. Normal appearance and bowel sounds are normal. There is no tenderness. There is no rigidity, no guarding and no CVA tenderness.  Abd soft and nontender  Musculoskeletal: Normal range of motion.       Thoracic back: Exhibits normal range of motion.       Lumbar back: Exhibits normal range of motion.  Full range of motion of the T-spine and L-spine No tenderness to palpation of the spinous processes of the T-spine or L-spine No crepitus, deformity or step-offs =No  tenderness to palpation of the paraspinous muscles of the L-spine  Patient has some mild tenderness palpation of the left upper trapezius to palpation. Lymphadenopathy:    Pt has no cervical adenopathy.  Neurological: Pt is alert and oriented to person, place, and time. Normal reflexes. No cranial nerve deficit. GCS eye subscore is 4. GCS verbal subscore is 5. GCS motor subscore is 6.  Reflex Scores:      Bicep reflexes are 2+ on the right side and 2+ on the left side.      Brachioradialis reflexes are 2+ on the right side  and 2+ on the left side.      Patellar reflexes are 2+ on the right side and 2+ on the left side.      Achilles reflexes are 2+ on the right side and 2+ on the left side. Speech is clear and goal oriented, follows commands Normal 5/5 strength in upper and lower extremities bilaterally including dorsiflexion and plantar flexion, strong and equal grip strength Sensation normal to light and sharp touch Moves extremities without ataxia, coordination intact Tremors noted with full arm extension. No Clonus  Skin: Skin is warm and dry. No rash noted. Pt is not diaphoretic. No erythema.  Psychiatric: Normal mood and affect.  Nursing note and vitals reviewed.     ED Treatments / Results  Labs (all labs ordered are listed, but only abnormal results are displayed) Labs Reviewed  COMPREHENSIVE METABOLIC PANEL - Abnormal; Notable for the following components:      Result Value   Potassium 3.2 (*)    Glucose, Bld 116 (*)    BUN 5 (*)    AST 73 (*)    ALT 92 (*)    Alkaline Phosphatase 35 (*)    All other components within normal limits  CBC - Abnormal; Notable for the following components:   RBC 3.67 (*)    Hemoglobin 12.3 (*)    HCT 36.1 (*)    RDW 15.7 (*)    All other components within normal limits  RAPID URINE DRUG SCREEN, HOSP PERFORMED - Abnormal; Notable for the following components:   Benzodiazepines POSITIVE (*)    Tetrahydrocannabinol POSITIVE (*)    All other components within normal limits  ETHANOL  CBG MONITORING, ED  I-STAT TROPONIN, ED    EKG EKG Interpretation  Date/Time:  Saturday May 15 2017 22:35:59 EDT Ventricular Rate:  76 PR Interval:    QRS Duration: 98 QT Interval:  383 QTC Calculation: 431 R Axis:   76 Text Interpretation:  Sinus arrhythmia Confirmed by Lacretia Leigh ([id number]) on 05/15/2017 11:06:43 PM   Radiology Dg Chest 2 View  Result Date: 05/15/2017 CLINICAL DATA:  37 year old male with chest pain.  Smoker. EXAM: CHEST - 2 VIEW COMPARISON:   None. FINDINGS: The heart size and mediastinal contours are within normal limits. Both lungs are clear. The visualized skeletal structures are unremarkable. IMPRESSION: No active cardiopulmonary disease. Electronically Signed   By: Milas Hock  Radparvar M.D.   On: 05/15/2017 23:10    Procedures Procedures (including critical care time)  Medications Ordered in ED Medications  potassium chloride SA (K-DUR,KLOR-CON) CR tablet 40 mEq (has no administration in time range)  LORazepam (ATIVAN) tablet 1 mg (has no administration in time range)     Initial Impression / Assessment and Plan / ED Course  I have reviewed the triage vital signs and the nursing notes.  Pertinent labs & imaging results that were available during my care of the patient were reviewed by me and considered in my medical decision making (see chart for details).     Patient presents to the emergency department today for evaluation of seizure.  Patient's last drink was 5 days ago.  History of withdrawal seizures.  Patient has felt tremulous today.  On exam patient with mild tachycardia.  He does have tremors on exam.  He has hematoma to the left forehead.  No other signs of intrathoracic or intra-abdominal trauma.  Patient is neurovascularly intact.  No focal neuro deficit noted on my examination.  Lab work shows no leukocytosis.  Hemoglobin appears at baseline.  Mild elevation of liver enzymes which appears baseline.  This is likely secondary to alcohol use.  Potassium 3.2 which was replaced with oral potassium.  UDS is positive for benzos and marijuana.  Patient does report some left upper chest pain.  Troponin was normal.  EKG showed no signs of acute ischemia.  Chest x-ray was unremarkable.  Clinical presentation does not seem consistent with ACS.  Low suspicion for PE, dissection or pneumonia.  I suspect that this is trauma from fall.  Nurse reports that patient Ciwa is 8.  However given the tremors will start patient on  Ativan.  CT of head and cervical spine were ordered.  This showed no acute findings.  Feel the patient would benefit from admission for delayed onset of withdrawals with seizure today.  Patient with history of withdrawal seizures.  With Dr. Maudie Mercury with hospital medicine who agrees to admission will see patient in the ED and place admission orders.  Patient remains hemodynamically stable and was updated on plan of care.  Agreeable to admission.  No further signs of the seizure-like activity at this time.  Dicussed with Dr. Zenia Resides who is agreeable with the above plan,.    Final Clinical Impressions(s) / ED Diagnoses   Final diagnoses:  Seizure-like activity ([place])  Alcohol withdrawal syndrome with complication [facility])    ED Discharge Orders    None       Aaron Edelman 05/16/17 0010    Lacretia Leigh, MD 05/17/17 1321

## 2017-05-16 ENCOUNTER — Inpatient Hospital Stay (HOSPITAL_COMMUNITY): Payer: Self-pay

## 2017-05-16 ENCOUNTER — Encounter (HOSPITAL_COMMUNITY): Payer: Self-pay | Admitting: Internal Medicine

## 2017-05-16 DIAGNOSIS — R569 Unspecified convulsions: Secondary | ICD-10-CM

## 2017-05-16 DIAGNOSIS — D649 Anemia, unspecified: Secondary | ICD-10-CM | POA: Diagnosis present

## 2017-05-16 DIAGNOSIS — F10239 Alcohol dependence with withdrawal, unspecified: Secondary | ICD-10-CM

## 2017-05-16 LAB — COMPREHENSIVE METABOLIC PANEL
ALT: 83 U/L — ABNORMAL HIGH (ref 17–63)
ANION GAP: 12 (ref 5–15)
AST: 59 U/L — AB (ref 15–41)
Albumin: 3.3 g/dL — ABNORMAL LOW (ref 3.5–5.0)
Alkaline Phosphatase: 33 U/L — ABNORMAL LOW (ref 38–126)
BILIRUBIN TOTAL: 0.9 mg/dL (ref 0.3–1.2)
BUN: 5 mg/dL — AB (ref 6–20)
CO2: 24 mmol/L (ref 22–32)
Calcium: 8.8 mg/dL — ABNORMAL LOW (ref 8.9–10.3)
Chloride: 106 mmol/L (ref 101–111)
Creatinine, Ser: 0.92 mg/dL (ref 0.61–1.24)
GFR calc Af Amer: >60 mL/min (ref >60)
Glucose, Bld: 80 mg/dL (ref 65–99)
POTASSIUM: 3.3 mmol/L — AB (ref 3.5–5.1)
Sodium: 142 mmol/L (ref 135–145)
TOTAL PROTEIN: 6.1 g/dL — AB (ref 6.5–8.1)

## 2017-05-16 LAB — CBC
HEMATOCRIT: 34.6 % — AB (ref 39.0–52.0)
Hemoglobin: 11.9 g/dL — ABNORMAL LOW (ref 13.0–17.0)
MCH: 34.2 pg — ABNORMAL HIGH (ref 26.0–34.0)
MCHC: 34.4 g/dL (ref 30.0–36.0)
MCV: 99.4 fL (ref 78.0–100.0)
PLATELETS: 151 10*3/uL (ref 150–400)
RBC: 3.48 MIL/uL — ABNORMAL LOW (ref 4.22–5.81)
RDW: 16.2 % — AB (ref 11.5–15.5)
WBC: 9.6 10*3/uL (ref 4.0–10.5)

## 2017-05-16 LAB — HIV ANTIBODY (ROUTINE TESTING W REFLEX): HIV Screen 4th Generation wRfx: NONREACTIVE

## 2017-05-16 MED ORDER — CHLORDIAZEPOXIDE HCL 25 MG PO CAPS
50.0000 mg | ORAL_CAPSULE | ORAL | Status: AC
  Administered 2017-05-16: 50 mg via ORAL
  Filled 2017-05-16: qty 2

## 2017-05-16 MED ORDER — HYDROXYZINE HCL 25 MG PO TABS: 50.0000 mg | ORAL_TABLET | Freq: Three times a day (TID) | ORAL | Status: DC | PRN

## 2017-05-16 MED ORDER — VITAMIN B-1 100 MG PO TABS
100.0000 mg | ORAL_TABLET | Freq: Every day | ORAL | Status: DC
  Administered 2017-05-16 – 2017-05-17 (×2): 100 mg via ORAL
  Filled 2017-05-16 (×2): qty 1

## 2017-05-16 MED ORDER — LORAZEPAM 2 MG/ML IJ SOLN
1.0000 mg | Freq: Four times a day (QID) | INTRAMUSCULAR | Status: DC | PRN
  Administered 2017-05-17: 1 mg via INTRAVENOUS

## 2017-05-16 MED ORDER — LORAZEPAM 1 MG PO TABS
1.0000 mg | ORAL_TABLET | Freq: Four times a day (QID) | ORAL | Status: DC | PRN
  Administered 2017-05-16 – 2017-05-17 (×3): 1 mg via ORAL
  Filled 2017-05-16 (×4): qty 1

## 2017-05-16 MED ORDER — TRAZODONE HCL 100 MG PO TABS: 100.0000 mg | ORAL_TABLET | Freq: Every evening | ORAL | Status: DC | PRN

## 2017-05-16 MED ORDER — CHLORDIAZEPOXIDE HCL 25 MG PO CAPS: 50.0000 mg | ORAL_CAPSULE | Freq: Every day | ORAL | Status: DC

## 2017-05-16 MED ORDER — THIAMINE HCL 100 MG/ML IJ SOLN
100.0000 mg | Freq: Every day | INTRAMUSCULAR | Status: DC
  Filled 2017-05-16: qty 2

## 2017-05-16 MED ORDER — CHLORDIAZEPOXIDE HCL 25 MG PO CAPS: 25.0000 mg | ORAL_CAPSULE | Freq: Every day | ORAL | Status: DC

## 2017-05-16 MED ORDER — LORAZEPAM 2 MG/ML IJ SOLN
0.0000 mg | Freq: Four times a day (QID) | INTRAMUSCULAR | Status: DC
  Administered 2017-05-16: 2 mg via INTRAVENOUS
  Administered 2017-05-16 (×2): 1 mg via INTRAVENOUS
  Filled 2017-05-16 (×3): qty 1

## 2017-05-16 MED ORDER — ADULT MULTIVITAMIN W/MINERALS CH
1.0000 | ORAL_TABLET | Freq: Every day | ORAL | Status: DC
  Administered 2017-05-16 – 2017-05-17 (×2): 1 via ORAL
  Filled 2017-05-16 (×2): qty 1

## 2017-05-16 MED ORDER — GABAPENTIN 300 MG PO CAPS
300.0000 mg | ORAL_CAPSULE | Freq: Three times a day (TID) | ORAL | Status: DC
  Administered 2017-05-16 – 2017-05-17 (×4): 300 mg via ORAL
  Filled 2017-05-16 (×4): qty 1

## 2017-05-16 MED ORDER — NALTREXONE HCL 50 MG PO TABS
25.0000 mg | ORAL_TABLET | Freq: Every day | ORAL | Status: DC
  Administered 2017-05-16 – 2017-05-17 (×2): 25 mg via ORAL
  Filled 2017-05-16 (×2): qty 1

## 2017-05-16 MED ORDER — FLUOXETINE HCL 20 MG PO CAPS
20.0000 mg | ORAL_CAPSULE | Freq: Every day | ORAL | Status: DC
  Administered 2017-05-16 – 2017-05-17 (×2): 20 mg via ORAL
  Filled 2017-05-16 (×2): qty 1

## 2017-05-16 MED ORDER — NICOTINE 21 MG/24HR TD PT24
21.0000 mg | MEDICATED_PATCH | Freq: Every day | TRANSDERMAL | Status: DC
  Administered 2017-05-16 – 2017-05-17 (×2): 21 mg via TRANSDERMAL
  Filled 2017-05-16 (×3): qty 1

## 2017-05-16 MED ORDER — POTASSIUM CHLORIDE IN NACL 20-0.9 MEQ/L-% IV SOLN
INTRAVENOUS | Status: AC
  Administered 2017-05-16: 04:00:00 via INTRAVENOUS
  Filled 2017-05-16 (×2): qty 1000

## 2017-05-16 MED ORDER — POTASSIUM CHLORIDE CRYS ER 20 MEQ PO TBCR
60.0000 meq | EXTENDED_RELEASE_TABLET | Freq: Once | ORAL | Status: AC
  Administered 2017-05-16: 60 meq via ORAL
  Filled 2017-05-16: qty 3

## 2017-05-16 MED ORDER — FOLIC ACID 1 MG PO TABS
1.0000 mg | ORAL_TABLET | Freq: Every day | ORAL | Status: DC
  Administered 2017-05-16 – 2017-05-17 (×2): 1 mg via ORAL
  Filled 2017-05-16 (×2): qty 1

## 2017-05-16 MED ORDER — LORAZEPAM 2 MG/ML IJ SOLN: 0.0000 mg | Freq: Two times a day (BID) | INTRAMUSCULAR | Status: DC

## 2017-05-16 NOTE — H&P (Signed)
TRH H&P   Patient Demographics:    Chad Morgan, is a 37 y.o. male  MRN: [id number]   DOB - 1980/10/26  Admit Date - 05/15/2017  Outpatient Primary MD for the patient is Patient, No Pcp Per  Referring MD/NP/PA: Ocie Cornfield  Outpatient Specialists:   Patient coming from: home  Chief Complaint  Patient presents with  . Seizures  . Alcohol Intoxication      HPI:    Chad Morgan  is a 37 y.o. male, w ETOH dep, Etoh withdrawal seizure, depression, PTSD,  Apparently was just admitted to [facility] from 5/6 to 5/9  and was placed on ativan alcohol detox protocol.  Pt states that he was discharged and has not drank since. He presented tonight due to feeling tremulous throughout the day.  The patient reported having a seizure which lasted minutes, tonic clonic involving all 4 ext with tongue lac.   In ED,  CXR => no active cardiopulmonary disease CT brain / C spine IMPRESSION: 1. No acute intracranial pathology. 2. No acute/traumatic cervical spine pathology.  etoh <10 Na 136, K 3.2  Bun 5, creatinine 1.02 Ast 73, Alt 92 Alk phos 35, T. Bili 0.6 Wbc 9.7, Hgb 12.3, Plt 152  UDS benzo, thc  Pt will be admitted for etoh withdrawal seizure        Review of systems:    In addition to the HPI above,  No Fever-chills, No Headache, No changes with Vision or hearing, No problems swallowing food or Liquids, No Chest pain, Cough or Shortness of Breath, No Abdominal pain, No Nausea or Vommitting, Bowel movements are regular, No Blood in stool or Urine, No dysuria, No new skin rashes or bruises, No new joints pains-aches,  No new weakness, tingling, numbness in any extremity, No recent weight gain or loss, No polyuria, polydypsia or polyphagia, No significant Mental Stressors.  A full 10 point Review of Systems was done, except as stated above, all other Review of  Systems were negative.   With Past History of the following :    Past Medical History:  Diagnosis Date  . Alcohol abuse   . Anxiety   . Depression   . PTSD (post-traumatic stress disorder)   . Seizures ([place])    with withdrawal      Past Surgical History:  Procedure Laterality Date  . WISDOM TOOTH EXTRACTION        Social History:     Social History   Tobacco Use  . Smoking status: Current Every Day Smoker    Packs/day: 1.00    Years: 16.00    Pack years: 16.00    Types: Cigarettes  . Smokeless tobacco: Never Used  Substance Use Topics  . Alcohol use: Yes    Comment:  1/2 gallon of liquor daily     Lives - at etoh detox program  Mobility - walks by  self   Family History :     Family History  Problem Relation Age of Onset  . Alcoholism Father       Home Medications:   Prior to Admission medications   Medication Sig Start Date End Date Taking? Authorizing Provider  diphenhydrAMINE (BENADRYL) 25 MG tablet Take 25 mg by mouth every 6 (six) hours as needed (shakiness).   Yes [provider]  FLUoxetine (PROZAC) 20 MG capsule Take 1 capsule (20 mg total) by mouth daily. For mood control 05/14/17   Money, Lowry Ram, FNP  gabapentin (NEURONTIN) 300 MG capsule Take 1 capsule (300 mg total) by mouth 3 (three) times daily. For alcohol withdrawal 05/13/17   Money, Lowry Ram, FNP  hydrOXYzine (ATARAX/VISTARIL) 50 MG tablet Take 1 tablet (50 mg total) by mouth 3 (three) times daily as needed for anxiety. 05/11/17   [name], NP  naltrexone (DEPADE) 50 MG tablet Take 0.5 tablets (25 mg total) by mouth daily. For cravings 05/14/17   Money, Lowry Ram, FNP  traZODone (DESYREL) 100 MG tablet Take 1 tablet (100 mg total) by mouth at bedtime as needed for sleep. 05/13/17   Money, Lowry Ram, FNP     Allergies:    No Known Allergies   Physical Exam:   Vitals  Blood pressure 122/73, pulse 86, temperature 98.1 F (36.7 C), temperature source Oral, resp. rate 18,  height _0  (1.88 m), weight 90.7 kg (200 lb), SpO2 96 %.   1. General  lying in bed in NAD,   2. Normal affect and insight, Not Suicidal or Homicidal, Awake Alert, Oriented X 3.  3. No F.N deficits, ALL C.Nerves Intact, Strength 5/5 all 4 extremities, Sensation intact all 4 extremities, Plantars down going.  4. Ears and Eyes appear Normal, Conjunctivae clear, PERRLA. Moist Oral Mucosa.  5. Supple Neck, No JVD, No cervical lymphadenopathy appriciated, No Carotid Bruits.  6. Symmetrical Chest wall movement, Good air movement bilaterally, CTAB.  7. RRR, No Gallops, Rubs or Murmurs, No Parasternal Heave.  8. Positive Bowel Sounds, Abdomen Soft, No tenderness, No organomegaly appriciated,No rebound -guarding or rigidity.  9.  No Cyanosis, Normal Skin Turgor, No Skin Rash or Bruise.  10. Good muscle tone,  joints appear normal , no effusions, Normal ROM.  11. No Palpable Lymph Nodes in Neck or Axillae     Data Review:    CBC Recent Labs  Lab 05/10/17 0334 05/15/17 2123  WBC 9.8 9.7  HGB 18.5* 12.3*  HCT 51.0 36.1*  PLT 190 152  MCV 93.6 98.4  MCH 33.9 33.5  MCHC 36.3* 34.1  RDW 15.2 15.7*   ------------------------------------------------------------------------------------------------------------------  Chemistries  Recent Labs  Lab 05/10/17 0334 05/15/17 2123  NA 141 136  K 3.4* 3.2*  CL 99* 101  CO2 27 22  GLUCOSE 125* 116*  BUN 13 5*  CREATININE 0.98 1.02  CALCIUM 8.5* 8.9  AST 177* 73*  ALT 209* 92*  ALKPHOS 67 35*  BILITOT 1.0 0.6   ------------------------------------------------------------------------------------------------------------------ estimated creatinine clearance is 117.5 mL/min (by C-G formula based on SCr of 1.02 mg/dL). ------------------------------------------------------------------------------------------------------------------ No results for input(s): TSH, T4TOTAL, T3FREE, THYROIDAB in the last 72 hours.  Invalid input(s):  FREET3  Coagulation profile No results for input(s): INR, PROTIME in the last 168 hours. ------------------------------------------------------------------------------------------------------------------- No results for input(s): DDIMER in the last 72 hours. -------------------------------------------------------------------------------------------------------------------  Cardiac Enzymes No results for input(s): CKMB, TROPONINI, MYOGLOBIN in the last 168 hours.  Invalid input(s): CK ------------------------------------------------------------------------------------------------------------------ No results found for:  BNP   ---------------------------------------------------------------------------------------------------------------  Urinalysis    Component Value Date/Time   COLORURINE YELLOW 04/16/2014 1121   APPEARANCEUR CLOUDY (A) 04/16/2014 1121   LABSPEC 1.023 04/16/2014 1121   PHURINE 5.5 04/16/2014 1121   GLUCOSEU NEGATIVE 04/16/2014 1121   HGBUR NEGATIVE 04/16/2014 1121   BILIRUBINUR NEGATIVE 04/16/2014 1121   KETONESUR NEGATIVE 04/16/2014 1121   PROTEINUR NEGATIVE 04/16/2014 1121   UROBILINOGEN 0.2 04/16/2014 1121   NITRITE NEGATIVE 04/16/2014 1121   LEUKOCYTESUR NEGATIVE 04/16/2014 1121    ----------------------------------------------------------------------------------------------------------------   Imaging Results:    Dg Chest 2 View  Result Date: 05/15/2017 CLINICAL DATA:  37 year old male with chest pain.  Smoker. EXAM: CHEST - 2 VIEW COMPARISON:  None. FINDINGS: The heart size and mediastinal contours are within normal limits. Both lungs are clear. The visualized skeletal structures are unremarkable. IMPRESSION: No active cardiopulmonary disease. Electronically Signed   By: Anner Crete M.D.   On: 05/15/2017 23:10   Ct Head Wo Contrast  Result Date: 05/16/2017 CLINICAL DATA:  37 year old male with C-spine trauma. EXAM: CT HEAD WITHOUT CONTRAST CT  CERVICAL SPINE WITHOUT CONTRAST TECHNIQUE: Multidetector CT imaging of the head and cervical spine was performed following the standard protocol without intravenous contrast. Multiplanar CT image reconstructions of the cervical spine were also generated. COMPARISON:  Head CT dated 12/17/2013 FINDINGS: CT HEAD FINDINGS Brain: The ventricles and sulci appropriate size for patient's age. The gray-white matter discrimination is preserved. Stable area of dystrophic calcification in the midline posterior fossa. There is no acute intracranial hemorrhage. No mass effect or midline shift. No extra-axial fluid collection. Vascular: No hyperdense vessel or unexpected calcification. Skull: Normal. Negative for fracture or focal lesion. Sinuses/Orbits: No acute finding. Other: None CT CERVICAL SPINE FINDINGS Alignment: Normal. Skull base and vertebrae: No acute fracture. No primary bone lesion or focal pathologic process. Soft tissues and spinal canal: No prevertebral fluid or swelling. No visible canal hematoma. Disc levels:  No acute findings.  No degenerative changes. Upper chest: Negative. Other: None IMPRESSION: 1. No acute intracranial pathology. 2. No acute/traumatic cervical spine pathology. Electronically Signed   By: Anner Crete M.D.   On: 05/16/2017 00:03   Ct Cervical Spine Wo Contrast  Result Date: 05/16/2017 CLINICAL DATA:  37 year old male with C-spine trauma. EXAM: CT HEAD WITHOUT CONTRAST CT CERVICAL SPINE WITHOUT CONTRAST TECHNIQUE: Multidetector CT imaging of the head and cervical spine was performed following the standard protocol without intravenous contrast. Multiplanar CT image reconstructions of the cervical spine were also generated. COMPARISON:  Head CT dated 12/17/2013 FINDINGS: CT HEAD FINDINGS Brain: The ventricles and sulci appropriate size for patient's age. The gray-white matter discrimination is preserved. Stable area of dystrophic calcification in the midline posterior fossa. There is  no acute intracranial hemorrhage. No mass effect or midline shift. No extra-axial fluid collection. Vascular: No hyperdense vessel or unexpected calcification. Skull: Normal. Negative for fracture or focal lesion. Sinuses/Orbits: No acute finding. Other: None CT CERVICAL SPINE FINDINGS Alignment: Normal. Skull base and vertebrae: No acute fracture. No primary bone lesion or focal pathologic process. Soft tissues and spinal canal: No prevertebral fluid or swelling. No visible canal hematoma. Disc levels:  No acute findings.  No degenerative changes. Upper chest: Negative. Other: None IMPRESSION: 1. No acute intracranial pathology. 2. No acute/traumatic cervical spine pathology. Electronically Signed   By: Anner Crete M.D.   On: 05/16/2017 00:03       Assessment & Plan:    Principal Problem:   Seizure [facility]) Active Problems:  Alcohol dependence ([name])   Alcohol dependence with uncomplicated withdrawal ([place])    Seizure ? Related to etoh withdrawal Librium 58m po qday x 1 day then 232mpo qday 1 day CIWA MRI brain  EEG  Abnormal liver function Check acute hepatitis panel   Hypokalemia Replete Check cmp in am  Anemia Check cbc in am  Depression Cont folic acid Cont atarax Cont neurontin   DVT Prophylaxis Lovenox - SCDs  AM Labs Ordered, also please review Full Orders  Family Communication: Admission, patients condition and plan of care including tests being ordered have been discussed with the patient  who indicate understanding and agree with the plan and Code Status.  Code Status FULL CODE  Likely DC to  home  Condition GUARDED    Consults called: none  Admission status: inpatient  Time spent in minutes :45   JaJani Gravel.D on 05/16/2017 at 1:31 AM  Between 7am to 7pm - Pager - [phone] After 7pm go to [email or web address] - password [email or web address] - Office  [phone]

## 2017-05-16 NOTE — Progress Notes (Signed)
Patient ID: Chad Morgan, male   DOB: [birth year], 37 y.o.   MRN: [id number] Patient was admitted early this morning for probable alcohol withdrawal seizure.  Patient seen and examined at bedside and plan of care discussed with him.  This morning's H&P and prior medical records were reviewed by myself.  Monitor mental status.  No seizures since admission.  MRI of the brain pending.  EEG pending.  Repeat a.m. labs.  Start diet.

## 2017-05-17 DIAGNOSIS — R945 Abnormal results of liver function studies: Secondary | ICD-10-CM

## 2017-05-17 DIAGNOSIS — F10230 Alcohol dependence with withdrawal, uncomplicated: Principal | ICD-10-CM

## 2017-05-17 DIAGNOSIS — F10220 Alcohol dependence with intoxication, uncomplicated: Secondary | ICD-10-CM

## 2017-05-17 DIAGNOSIS — C719 Malignant neoplasm of brain, unspecified: Secondary | ICD-10-CM

## 2017-05-17 LAB — COMPREHENSIVE METABOLIC PANEL
ALBUMIN: 3.3 g/dL — AB (ref 3.5–5.0)
ALK PHOS: 32 U/L — AB (ref 38–126)
ALT: 73 U/L — ABNORMAL HIGH (ref 17–63)
ANION GAP: 9 (ref 5–15)
AST: 43 U/L — AB (ref 15–41)
BUN: 10 mg/dL (ref 6–20)
CALCIUM: 8.5 mg/dL — AB (ref 8.9–10.3)
CO2: 23 mmol/L (ref 22–32)
Chloride: 107 mmol/L (ref 101–111)
Creatinine, Ser: 0.83 mg/dL (ref 0.61–1.24)
GFR calc Af Amer: >60 mL/min (ref >60)
GFR calc non Af Amer: >60 mL/min (ref >60)
Glucose, Bld: 89 mg/dL (ref 65–99)
Potassium: 3.5 mmol/L (ref 3.5–5.1)
SODIUM: 139 mmol/L (ref 135–145)
Total Bilirubin: 0.7 mg/dL (ref 0.3–1.2)
Total Protein: 6 g/dL — ABNORMAL LOW (ref 6.5–8.1)

## 2017-05-17 LAB — HEPATITIS PANEL, ACUTE
HEP A IGM: NEGATIVE
Hep B C IgM: NEGATIVE
Hepatitis B Surface Ag: NEGATIVE

## 2017-05-17 LAB — CBC WITH DIFFERENTIAL/PLATELET
BASOS ABS: 0 10*3/uL (ref 0.0–0.1)
Basophils Relative: 0 %
EOS ABS: 0.1 10*3/uL (ref 0.0–0.7)
Eosinophils Relative: 1 %
HCT: 34.3 % — ABNORMAL LOW (ref 39.0–52.0)
HEMOGLOBIN: 11.6 g/dL — AB (ref 13.0–17.0)
Lymphocytes Relative: 37 %
Lymphs Abs: 3.9 10*3/uL (ref 0.7–4.0)
MCH: 33.8 pg (ref 26.0–34.0)
MCHC: 33.8 g/dL (ref 30.0–36.0)
MCV: 100 fL (ref 78.0–100.0)
Monocytes Absolute: 1.1 10*3/uL — ABNORMAL HIGH (ref 0.1–1.0)
Monocytes Relative: 10 %
NEUTROS ABS: 5.4 10*3/uL (ref 1.7–7.7)
NEUTROS PCT: 52 %
Platelets: 171 10*3/uL (ref 150–400)
RBC: 3.43 MIL/uL — AB (ref 4.22–5.81)
RDW: 16.1 % — ABNORMAL HIGH (ref 11.5–15.5)
WBC: 10.5 10*3/uL (ref 4.0–10.5)

## 2017-05-17 LAB — MAGNESIUM: Magnesium: 2 mg/dL (ref 1.7–2.4)

## 2017-05-17 MED ORDER — FOLIC ACID 1 MG PO TABS: 1.0000 mg | ORAL_TABLET | Freq: Every day | ORAL | 0 refills | Status: DC

## 2017-05-17 MED ORDER — ADULT MULTIVITAMIN W/MINERALS CH
1.0000 | ORAL_TABLET | Freq: Every day | ORAL | 0 refills | Status: DC
Start: 2017-05-18 — End: 2018-12-29

## 2017-05-17 MED ORDER — THIAMINE HCL 100 MG PO TABS: 100.0000 mg | ORAL_TABLET | Freq: Every day | ORAL | 0 refills | Status: DC

## 2017-05-17 NOTE — Progress Notes (Signed)
Resources for PCP placed on AVS for patient. Marney Doctor RN,BSN,NCM [phone]

## 2017-05-17 NOTE — Progress Notes (Signed)
PT Cancellation Note  Patient Details Name: Chad Morgan MRN: [id number] DOB: 21-Mar-1980   Cancelled Treatment:    Reason Eval/Treat Not Completed: PT screened, no needs identified, will sign off   [facility] 05/17/2017, 12:38 PM

## 2017-05-17 NOTE — Discharge Summary (Signed)
Physician Discharge Summary  Chad Morgan VEH:[id number] DOB: Dec 03, 1980 DOA: 05/15/2017  PCP: Patient, No Pcp Per  Admit date: 05/15/2017 Discharge date: 05/17/2017  Admitted From: Home Disposition: Home  Recommendations for Outpatient Follow-up:  1. Follow up with PCP in 1 week 2. Follow-up with neurology and neurosurgery as an outpatient 3. Follow-up with psychiatry as an outpatient  [name]: No Equipment/Devices: None  Discharge Condition: Stable CODE STATUS: Full Diet recommendation:  Regular   Brief/Interim Summary: 37 year old male with history of alcohol abuse, alcohol withdrawal seizure, depression, PTSD who was just admitted to behavioral health hospital from 05/10/2017- 05/13/2017 and treated for alcohol withdrawal.  Patient presented on 05/16/2017 with an episode of seizure.  It was thought that he had alcohol withdrawal seizures.  No more seizure activity since admission.  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  Spoke to neurosurgery who recommended outpatient follow-up.  Neurology thinks patient does not need antiseizure medications.  Patient is stable for discharge.  Discharge Diagnoses:  Principal Problem:   Seizure [facility]) Active Problems:   Alcohol dependence ([place])   Alcohol dependence with uncomplicated withdrawal ([place])   Anemia  Probable alcohol withdrawal seizure -No more seizure episodes since admission.  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  Patient is awake, alert and has been seizure-free since admission.  Discharge patient home with outpatient follow-up with primary care provider and/or neurology.  Patient should not be driving until cleared by primary care provider.  Alcohol abuse and recent alcohol withdrawal -Patient was recently discharged from behavioral health Hospital for the same and has not picked up his prescriptions yet.   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 Annette Stable who stated that patient can be followed up as an outpatient with neurosurgery and does not need any inpatient neurosurgical evaluation.  Abnormal liver function test -Probably secondary to alcohol.  Improving.  Right upper quadrant ultrasound was negative.  Outpatient follow-up  Hypokalemia -Replaced.  Resolved  Discharge Instructions  Discharge Instructions    Ambulatory referral to Neurology   Complete by:  As directed    An appointment is requested in approximately: 1-2 weeks. Recent seizure.?alcohol related   Ambulatory referral to Neurosurgery   Complete by:  As directed    ?Ependymoma. Follow up with Dr. Annette Stable   Call MD for:  difficulty breathing, headache or visual disturbances   Complete by:  As directed    Call MD for:  extreme fatigue   Complete by:  As directed    Call MD for:  hives   Complete by:  As directed    Call MD for:  persistant dizziness or light-headedness   Complete by:  As directed    Call MD for:  persistant nausea and vomiting   Complete by:  As directed    Call MD for:  severe uncontrolled pain   Complete by:  As directed    Call MD for:  temperature >100.4   Complete by:  As directed    Diet general   Complete by:  As directed    Increase activity slowly   Complete by:  As directed      Allergies as of 05/17/2017   No Known Allergies     Medication List    STOP taking these medications   diphenhydrAMINE 25 MG tablet Commonly known as:  BENADRYL   traZODone 100 MG tablet Commonly known as:  DESYREL     TAKE these medications   FLUoxetine 20 MG capsule Commonly known as:  PROZAC Take 1 capsule (20 mg total) by mouth daily. For mood control   folic acid 1 MG tablet Commonly known as:  FOLVITE Take 1 tablet (1 mg total) by mouth daily. Start taking on:   05/18/2017   gabapentin 300 MG capsule Commonly known as:  NEURONTIN Take 1 capsule (300 mg total) by mouth 3 (three) times daily. For alcohol withdrawal   hydrOXYzine 50 MG tablet Commonly known as:  ATARAX/VISTARIL Take 1 tablet (50 mg total) by mouth 3 (three) times daily as needed for anxiety.   multivitamin with minerals Tabs tablet Take 1 tablet by mouth daily. Start taking on:  05/18/2017   naltrexone 50 MG tablet Commonly known as:  DEPADE Take 0.5 tablets (25 mg total) by mouth daily. For cravings   thiamine 100 MG tablet Take 1 tablet (100 mg total) by mouth daily. Start taking on:  05/18/2017      Follow-up Information    PCP. Schedule an appointment as soon as possible for a visit in 1 week(s).        Earnie Larsson, MD. Schedule an appointment as soon as possible for a visit in 2 week(s).   Specialty:  Neurosurgery Contact information: [address] [place] [address] [phone]          No Known Allergies  Consultations:  Spoke to neurosurgery/Dr. Trenton Gammon on phone and spoke to Dr. Arora/neurology on phone   Procedures/Studies: Dg Chest 2 View  Result Date: 05/15/2017 CLINICAL DATA:  37 year old male with chest pain.  Smoker. EXAM: CHEST - 2 VIEW COMPARISON:  None. FINDINGS: The heart size and mediastinal contours are within normal limits. Both lungs are clear. The visualized skeletal structures are unremarkable. IMPRESSION: No active cardiopulmonary disease. Electronically Signed   By: Anner Crete M.D.   On: 05/15/2017 23:10   Ct Head Wo Contrast  Result Date: 05/16/2017 CLINICAL DATA:  37 year old male with C-spine trauma. EXAM: CT HEAD WITHOUT CONTRAST CT CERVICAL SPINE WITHOUT CONTRAST TECHNIQUE: Multidetector CT imaging of the head and cervical spine was performed following the standard protocol without intravenous contrast. Multiplanar CT image reconstructions of the cervical spine were also generated. COMPARISON:  Head CT  dated 12/17/2013 FINDINGS: CT HEAD FINDINGS Brain: The ventricles and sulci appropriate size for patient's age. The gray-white matter discrimination is preserved. Stable area of dystrophic calcification in the midline posterior fossa. There is no acute intracranial hemorrhage. No mass effect or midline shift. No extra-axial fluid collection. Vascular: No hyperdense vessel or unexpected calcification. Skull: Normal. Negative for fracture or focal lesion. Sinuses/Orbits: No acute finding. Other: None CT CERVICAL SPINE FINDINGS Alignment: Normal. Skull base and vertebrae: No acute fracture. No primary bone lesion or focal pathologic process. Soft tissues and spinal canal: No prevertebral fluid or swelling. No visible canal hematoma. Disc levels:  No acute findings.  No degenerative changes. Upper chest: Negative. Other: None IMPRESSION: 1. No acute intracranial pathology. 2. No acute/traumatic cervical spine pathology. Electronically Signed   By: Anner Crete M.D.   On: 05/16/2017 00:03   Ct Cervical Spine Wo Contrast  Result Date: 05/16/2017 CLINICAL DATA:  37 year old male with C-spine trauma. EXAM: CT HEAD WITHOUT CONTRAST CT CERVICAL SPINE WITHOUT CONTRAST TECHNIQUE: Multidetector CT imaging of the head and cervical spine was performed following the standard protocol without intravenous contrast. Multiplanar  CT image reconstructions of the cervical spine were also generated. COMPARISON:  Head CT dated 12/17/2013 FINDINGS: CT HEAD FINDINGS Brain: The ventricles and sulci appropriate size for patient's age. The gray-white matter discrimination is preserved. Stable area of dystrophic calcification in the midline posterior fossa. There is no acute intracranial hemorrhage. No mass effect or midline shift. No extra-axial fluid collection. Vascular: No hyperdense vessel or unexpected calcification. Skull: Normal. Negative for fracture or focal lesion. Sinuses/Orbits: No acute finding. Other: None CT CERVICAL SPINE  FINDINGS Alignment: Normal. Skull base and vertebrae: No acute fracture. No primary bone lesion or focal pathologic process. Soft tissues and spinal canal: No prevertebral fluid or swelling. No visible canal hematoma. Disc levels:  No acute findings.  No degenerative changes. Upper chest: Negative. Other: None IMPRESSION: 1. No acute intracranial pathology. 2. No acute/traumatic cervical spine pathology. Electronically Signed   By: Anner Crete M.D.   On: 05/16/2017 00:03   Mr Brain Wo Contrast  Result Date: 05/16/2017 CLINICAL DATA:  Possible seizure yesterday. History of previous seizure 2013. EXAM: MRI HEAD WITHOUT CONTRAST TECHNIQUE: Multiplanar, multiecho pulse sequences of the brain and surrounding structures were obtained without intravenous contrast. COMPARISON:  CT 05/15/2017 and 12/17/2013. FINDINGS: Brain: Diffusion imaging does not show any acute or subacute infarction or other cause of restricted diffusion. The brainstem is normal. There appears to be a degree of cerebellar atrophy. 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 There is a well-circumscribed, lobular mass which is extra-axial in the midline posterior fossa measuring 2.3 x 1.0 x 1.6 cm. On CT, this contains calcifications. This is likely to be a tumor in the ependymoma/sub ependymoma line. It does not appear to of changed since 2015, therefore likely benign. Cerebral hemispheres show mild generalized atrophy. No evidence of old or acute infarction, mass lesion, hemorrhage, hydrocephalus or extra-axial collection. Mesial temporal lobes are symmetric. Vascular: Major vessels at the base of the brain show flow. Skull and upper cervical spine: Negative Sinuses/Orbits: Clear/normal Other: None IMPRESSION: No acute finding. Generalized atrophy. Vermian hypoplasia. 2.3 x 1.0 x 1.6 cm mass which appears to be extra-axial in the midline posterior fossa inferiorly. See above discussion. Most likely diagnosis is benign or low-grade tumor in the sub  ependymoma/ependymoma line. No visible change since the CT of 2015. Electronically Signed   By: Nelson Chimes M.D.   On: 05/16/2017 12:06   US Abdomen Limited Ruq  Result Date: 05/16/2017 CLINICAL DATA:  Elevated LFTs. EXAM: ULTRASOUND ABDOMEN LIMITED RIGHT UPPER QUADRANT COMPARISON:  CT of the abdomen and pelvis 12/17/2013 FINDINGS: Gallbladder: Gallbladder has a normal appearance. Gallbladder wall is 2.4 millimeters, within normal limits. No stones or pericholecystic fluid. No sonographic Murphy's sign. Common bile duct: Diameter: 4.7 millimeters Liver: No focal lesion identified. Within normal limits in parenchymal echogenicity. Portal vein is patent on color Doppler imaging with normal direction of blood flow towards the liver. IMPRESSION: Normal evaluation of the RIGHT UPPER QUADRANT. Electronically Signed   By: Nolon Nations M.D.   On: 05/16/2017 13:28      Subjective: Patient seen and examined at bedside.  He denies any overnight fever, nausea, vomiting.  No overnight seizure-like activities.  He feels better.  Discharge Exam: Vitals:   05/16/17 2346 05/17/17 0543  BP: 123/81 125/81  Pulse: 74 82  Resp: 16 14  Temp: 98.1 F (36.7 C) 97.8 F (36.6 C)  SpO2: 97% 94%   Vitals:   05/16/17 1528 05/16/17 2107 05/16/17 2346 05/17/17 0543  BP: (!) 95/57 116/74 123/81 125/81  Pulse: 79 79 74 82  Resp: 18 14 16 14   Temp: 98.1 F (36.7 C) 98.3 F (36.8 C) 98.1 F (36.7 C) 97.8 F (36.6 C)  TempSrc: Oral Oral Oral Oral  SpO2: 97% 97% 97% 94%  Weight:    88.4 kg (194 lb 12.8 oz)  Height:        General: Pt is alert, awake, not in acute distress Cardiovascular: Rate controlled, S1-S2 positive  respiratory: Bilateral decreased breath sounds at bases Abdominal: Soft, NT, ND, bowel sounds + Extremities: no edema, no cyanosis    The results of significant diagnostics from this hospitalization (including imaging, microbiology, ancillary and laboratory) are listed below for  reference.     Microbiology: No results found for this or any previous visit (from the past 240 hour(s)).   Labs: BNP (last 3 results) No results for input(s): BNP in the last 8760 hours. Basic Metabolic Panel: Recent Labs  Lab 05/15/17 2123 05/16/17 0541 05/17/17 0540  NA 136 142 139  K 3.2* 3.3* 3.5  CL 101 106 107  CO2 22 24 23   GLUCOSE 116* 80 89  BUN 5* 5* 10  CREATININE 1.02 0.92 0.83  CALCIUM 8.9 8.8* 8.5*  MG  --   --  2.0   Liver Function Tests: Recent Labs  Lab 05/15/17 2123 05/16/17 0541 05/17/17 0540  AST 73* 59* 43*  ALT 92* 83* 73*  ALKPHOS 35* 33* 32*  BILITOT 0.6 0.9 0.7  PROT 6.8 6.1* 6.0*  ALBUMIN 3.8 3.3* 3.3*   No results for input(s): LIPASE, AMYLASE in the last 168 hours. No results for input(s): AMMONIA in the last 168 hours. CBC: Recent Labs  Lab 05/15/17 2123 05/16/17 0541 05/17/17 0540  WBC 9.7 9.6 10.5  NEUTROABS  --   --  5.4  HGB 12.3* 11.9* 11.6*  HCT 36.1* 34.6* 34.3*  MCV 98.4 99.4 100.0  PLT 152 151 171   Cardiac Enzymes: No results for input(s): CKTOTAL, CKMB, CKMBINDEX, TROPONINI in the last 168 hours. BNP: Invalid input(s): POCBNP CBG: No results for input(s): GLUCAP in the last 168 hours. D-Dimer No results for input(s): DDIMER in the last 72 hours. Hgb A1c No results for input(s): HGBA1C in the last 72 hours. Lipid Profile No results for input(s): CHOL, HDL, LDLCALC, TRIG, CHOLHDL, LDLDIRECT in the last 72 hours. Thyroid function studies No results for input(s): TSH, T4TOTAL, T3FREE, THYROIDAB in the last 72 hours.  Invalid input(s): FREET3 Anemia work up No results for input(s): VITAMINB12, FOLATE, FERRITIN, TIBC, IRON, RETICCTPCT in the last 72 hours. Urinalysis    Component Value Date/Time   COLORURINE YELLOW 04/16/2014 1121   APPEARANCEUR CLOUDY (A) 04/16/2014 1121   LABSPEC 1.023 04/16/2014 1121   PHURINE 5.5 04/16/2014 1121   GLUCOSEU NEGATIVE 04/16/2014 1121   HGBUR NEGATIVE 04/16/2014 1121    BILIRUBINUR NEGATIVE 04/16/2014 1121   KETONESUR NEGATIVE 04/16/2014 1121   PROTEINUR NEGATIVE 04/16/2014 1121   UROBILINOGEN 0.2 04/16/2014 1121   NITRITE NEGATIVE 04/16/2014 1121   LEUKOCYTESUR NEGATIVE 04/16/2014 1121   Sepsis Labs Invalid input(s): PROCALCITONIN,  WBC,  LACTICIDVEN Microbiology No results found for this or any previous visit (from the past 240 hour(s)).   Time coordinating discharge: 35 minutes  SIGNED:   Aline August, MD  [facility] 05/17/2017, 9:57 AM Pager: [phone]  If 7PM-7AM, please contact night-coverage [email or web address] Password [email or web address]

## 2018-04-09 ENCOUNTER — Encounter (HOSPITAL_COMMUNITY): Payer: Self-pay

## 2018-04-09 ENCOUNTER — Emergency Department (HOSPITAL_COMMUNITY)
Admission: EM | Admit: 2018-04-09 | Discharge: 2018-04-09 | Disposition: A | Discharge status: Home / Self Care | Payer: BC Managed Care – PPO | Attending: Emergency Medicine | Admitting: Emergency Medicine

## 2018-04-09 DIAGNOSIS — F17210 Nicotine dependence, cigarettes, uncomplicated: Secondary | ICD-10-CM | POA: Insufficient documentation

## 2018-04-09 DIAGNOSIS — F10929 Alcohol use, unspecified with intoxication, unspecified: Secondary | ICD-10-CM | POA: Insufficient documentation

## 2018-04-09 DIAGNOSIS — Z79899 Other long term (current) drug therapy: Secondary | ICD-10-CM | POA: Diagnosis not present

## 2018-04-09 DIAGNOSIS — F10920 Alcohol use, unspecified with intoxication, uncomplicated: Secondary | ICD-10-CM

## 2018-04-09 LAB — CBC WITH DIFFERENTIAL/PLATELET
Abs Immature Granulocytes: 0.03 10*3/uL (ref 0.00–0.07)
Basophils Absolute: 0.1 10*3/uL (ref 0.0–0.1)
Basophils Relative: 1 %
Eosinophils Absolute: 0.2 10*3/uL (ref 0.0–0.5)
Eosinophils Relative: 2 %
HCT: 50.5 % (ref 39.0–52.0)
Hemoglobin: 17.9 g/dL — ABNORMAL HIGH (ref 13.0–17.0)
Immature Granulocytes: 0 %
Lymphocytes Relative: 44 %
Lymphs Abs: 5.9 10*3/uL — ABNORMAL HIGH (ref 0.7–4.0)
MCH: 33.1 pg (ref 26.0–34.0)
MCHC: 35.4 g/dL (ref 30.0–36.0)
MCV: 93.3 fL (ref 80.0–100.0)
Monocytes Absolute: 0.5 10*3/uL (ref 0.1–1.0)
Monocytes Relative: 4 %
Neutro Abs: 6.5 10*3/uL (ref 1.7–7.7)
Neutrophils Relative %: 49 %
Platelets: 297 10*3/uL (ref 150–400)
RBC: 5.41 MIL/uL (ref 4.22–5.81)
RDW: 12.7 % (ref 11.5–15.5)
WBC: 13.2 10*3/uL — ABNORMAL HIGH (ref 4.0–10.5)
nRBC: 0 % (ref 0.0–0.2)

## 2018-04-09 LAB — COMPREHENSIVE METABOLIC PANEL
ALT: 22 U/L (ref 0–44)
AST: 28 U/L (ref 15–41)
Albumin: 5 g/dL (ref 3.5–5.0)
Alkaline Phosphatase: 52 U/L (ref 38–126)
Anion gap: 15 (ref 5–15)
BUN: 12 mg/dL (ref 6–20)
CO2: 20 mmol/L — ABNORMAL LOW (ref 22–32)
Calcium: 8.8 mg/dL — ABNORMAL LOW (ref 8.9–10.3)
Chloride: 105 mmol/L (ref 98–111)
Creatinine, Ser: 0.99 mg/dL (ref 0.61–1.24)
GFR calc Af Amer: >60 mL/min (ref >60)
GFR calc non Af Amer: >60 mL/min (ref >60)
Glucose, Bld: 94 mg/dL (ref 70–99)
Potassium: 3.8 mmol/L (ref 3.5–5.1)
Sodium: 140 mmol/L (ref 135–145)
Total Bilirubin: 0.5 mg/dL (ref 0.3–1.2)
Total Protein: 8.2 g/dL — ABNORMAL HIGH (ref 6.5–8.1)

## 2018-04-09 LAB — LIPASE, BLOOD: Lipase: 57 U/L — ABNORMAL HIGH (ref 11–51)

## 2018-04-09 LAB — ETHANOL: Alcohol, Ethyl (B): 406 mg/dL (ref <10)

## 2018-04-09 LAB — RAPID URINE DRUG SCREEN, HOSP PERFORMED
Amphetamines: NOT DETECTED
Barbiturates: NOT DETECTED
Benzodiazepines: NOT DETECTED
Cocaine: NOT DETECTED
Opiates: NOT DETECTED
Tetrahydrocannabinol: POSITIVE — AB

## 2018-04-09 MED ORDER — SODIUM CHLORIDE 0.9 % IV BOLUS
1000.0000 mL | Freq: Once | INTRAVENOUS | Status: AC
  Administered 2018-04-09: 1000 mL via INTRAVENOUS

## 2018-04-09 MED ORDER — LORAZEPAM 2 MG/ML IJ SOLN
1.0000 mg | Freq: Once | INTRAMUSCULAR | Status: AC
  Administered 2018-04-09: 10:00:00 1 mg via INTRAVENOUS
  Filled 2018-04-09: qty 1

## 2018-04-09 MED ORDER — CHLORDIAZEPOXIDE HCL 25 MG PO CAPS: ORAL_CAPSULE | ORAL | 0 refills | Status: DC

## 2018-04-09 MED ORDER — ONDANSETRON 4 MG PO TBDP: 4.0000 mg | ORAL_TABLET | Freq: Three times a day (TID) | ORAL | 0 refills | Status: DC | PRN

## 2018-04-09 MED ORDER — ONDANSETRON 4 MG PO TBDP
4.0000 mg | ORAL_TABLET | Freq: Once | ORAL | Status: AC
  Administered 2018-04-09: 10:00:00 4 mg via ORAL
  Filled 2018-04-09: qty 1

## 2018-04-09 NOTE — ED Notes (Signed)
Pt ambulated stated he was still feeling bad and dizzy gait was steady.

## 2018-04-09 NOTE — ED Notes (Signed)
Pt reports he is feeling extremely nauseated and anxious.

## 2018-04-09 NOTE — ED Provider Notes (Signed)
Care assumed from previous provider PA [place]. Please see note for further details. Case discussed, plan agreed upon. Will re-evaluate. Anticipate discharge once clinically sober.   On evaluation, patient denies any SI/HI/AVH. He is tachycardic. He reports nausea as well. He feels as if he is in withdrawal. CIWA of 9. He would like to quit drinking. Given zofran and 1mg  ativan. Will continue to monitor.   On re-evaluation, patient feels much improved. Nausea resolved. CIWA down to 6. He feels comfortable going home. Will give rx for librium taper and zofran. Discussed meds, return precautions. All questions answered.    Ward, Ozella Almond, PA-C 04/09/18 1124    Long, Wonda Old[facility], MD 04/09/18 [id number]

## 2018-04-09 NOTE — ED Triage Notes (Addendum)
Pt arrives due to alcohol intoxication. Pt reports he has been drinking for a week straight. Pt reports last drink was an hour ago, pt reports drinking around a fifth of liquor. Hx of withdraws with seizures per pt. Pt reports increase of drinking is due to "everything going on." Pt reports 2 episodes of vomiting tonight. Pt denies SI/HI to this RN.

## 2018-04-09 NOTE — Discharge Instructions (Signed)
1. Medications: Take librium taper, zofran as needed for nausea. I would encourage you to take a multi-vitamin daily. Continue usual home medications 2. Treatment: rest, drink plenty of fluids, eat a balanced diet of vegetables 3. Follow Up: Please use the resource guide provided to find a treatment center. Please let them know that you have had your medical clearance labs and they are reassuring. Please return to the ER for high fevers, persistent vomiting, seizures or other concerns.

## 2018-04-09 NOTE — ED Notes (Signed)
Patient verbalizes understanding of discharge instructions. Opportunity for questioning and answers were provided. Armband removed by staff, pt discharged from ED home via POV.  

## 2018-04-09 NOTE — ED Notes (Signed)
Patient provided sprite for PO challenge.

## 2018-04-09 NOTE — ED Provider Notes (Signed)
[facility] EMERGENCY DEPARTMENT Provider Note   CSN: [id number] Arrival date & time: 04/09/18  0208    History   Chief Complaint Chief Complaint  Patient presents with  . Alcohol Intoxication    HPI Chad Morgan is a 38 y.o. male with a hx of alcohol abuse, anxiety, depression, PTSD, alcohol withdrawal seizures presents to the Emergency Department, by his wife for alcohol intoxication onset this afternoon.  Patient reports he has been drinking 1/5 of liquor every night for the last week.  He states that he has a history of alcohol abuse but has been sober for almost a year.  He states that it has been a very long time since he has had an alcohol withdrawal seizure.  Patient denies fever, chills, cough, congestion, known sick contacts.  He denies headache, neck pain, chest pain, shortness of breath, abdominal pain, weakness, dizziness, syncope.  He does report associated emesis x2 tonight stating that it was nonbloody and nonbilious.  Specific aggravating or alleviating factors.  Patient reports he is currently drinking because he feels stressed.  Denies falls or headache.    The history is provided by the patient and medical records.    Past Medical History:  Diagnosis Date  . Alcohol abuse   . Anxiety   . Depression   . PTSD (post-traumatic stress disorder)   . Seizures ([place])    with withdrawal    Patient Active Problem List   Diagnosis Date Noted  . Seizure ([place]) 05/16/2017  . Anemia 05/16/2017  . 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 MDD (major depressive disorder), severe ([place]) 03/19/2017  . Severe episode of recurrent major depressive disorder, without psychotic features ([place])   . Alcohol use disorder, severe, dependence ([place]) 05/20/2015  . Alcohol dependence with withdrawal with complication ([place])   . Major depressive disorder, recurrent, severe without psychotic features ([place])   . PTSD  (post-traumatic stress disorder) 04/25/2014  . Severe major depression without psychotic features ([place]) 04/24/2014  . Suicidal ideations   . Alcohol dependence with withdrawal, uncomplicated ([place]) 74/25/9563  . Alcohol dependence with intoxication with complication ([place]) 87/56/4332  . Tobacco abuse 04/16/2014  . Alcohol withdrawal ([place]) 04/16/2014  . Alcohol dependence with uncomplicated withdrawal ([place])   . Major depressive disorder, recurrent episode, moderate ([place])   . Social anxiety disorder 01/09/2014  . Panic attacks 01/09/2014  . GAD (generalized anxiety disorder) 01/09/2014  . Alcohol dependence ([place]) 01/08/2014    Past Surgical History:  Procedure Laterality Date  . WISDOM TOOTH EXTRACTION          Home Medications    Prior to Admission medications   Medication Sig Start Date End Date Taking? Authorizing Provider  FLUoxetine (PROZAC) 20 MG capsule Take 1 capsule (20 mg total) by mouth daily. For mood control 05/14/17   Money, Lowry Ram, FNP  folic acid (FOLVITE) 1 MG tablet Take 1 tablet (1 mg total) by mouth daily. 05/18/17   Aline August, MD  gabapentin (NEURONTIN) 300 MG capsule Take 1 capsule (300 mg total) by mouth 3 (three) times daily. For alcohol withdrawal 05/13/17   Money, Lowry Ram, FNP  hydrOXYzine (ATARAX/VISTARIL) 50 MG tablet Take 1 tablet (50 mg total) by mouth 3 (three) times daily as needed for anxiety. 05/11/17   [name], NP  Multiple Vitamin (MULTIVITAMIN WITH MINERALS) TABS tablet Take 1 tablet by mouth daily. 05/18/17   Aline August, MD  naltrexone (DEPADE) 50 MG tablet  Take 0.5 tablets (25 mg total) by mouth daily. For cravings 05/14/17   Money, Lowry Ram, FNP  thiamine 100 MG tablet Take 1 tablet (100 mg total) by mouth daily. 05/18/17   Aline August, MD    Family History Family History  Problem Relation Age of Onset  . Alcoholism Father     Social History Social History   Tobacco Use  . Smoking status: Current Every Day Smoker     Packs/day: 1.00    Years: 16.00    Pack years: 16.00    Types: Cigarettes  . Smokeless tobacco: Never Used  Substance Use Topics  . Alcohol use: Yes    Comment:  1/2 gallon of liquor daily  . Drug use: Yes    Types: Benzodiazepines, Marijuana    Comment: Patient denies      Allergies   Patient has no known allergies.   Review of Systems Review of Systems  Constitutional: Negative for appetite change, diaphoresis, fatigue, fever and unexpected weight change.  HENT: Negative for mouth sores.   Eyes: Negative for visual disturbance.  Respiratory: Negative for cough, chest tightness, shortness of breath and wheezing.   Cardiovascular: Negative for chest pain.  Gastrointestinal: Negative for abdominal pain, constipation, diarrhea, nausea and vomiting.  Endocrine: Negative for polydipsia, polyphagia and polyuria.  Genitourinary: Negative for dysuria, frequency, hematuria and urgency.  Musculoskeletal: Negative for back pain and neck stiffness.  Skin: Negative for rash.  Allergic/Immunologic: Negative for immunocompromised state.  Neurological: Negative for syncope, light-headedness and headaches.  Hematological: Does not bruise/bleed easily.  Psychiatric/Behavioral: Negative for sleep disturbance. The patient is not nervous/anxious.      Physical Exam Updated Vital Signs BP 126/78 (BP Location: Left Arm)   Pulse 96   Temp 98.3 F (36.8 C) (Oral)   Resp 18   Ht 6\' 2"  (1.88 m)   SpO2 90%   BMI 25.01 kg/m   Physical Exam Vitals signs and nursing note reviewed.  Constitutional:      General: He is not in acute distress.    Appearance: He is not diaphoretic.  HENT:     Head: Normocephalic.  Eyes:     General: No scleral icterus.    Conjunctiva/sclera: Conjunctivae normal.  Neck:     Musculoskeletal: Normal range of motion.  Cardiovascular:     Rate and Rhythm: Normal rate and regular rhythm.     Pulses: Normal pulses.          Radial pulses are 2+ on the right  side and 2+ on the left side.  Pulmonary:     Effort: No tachypnea, accessory muscle usage, prolonged expiration, respiratory distress or retractions.     Breath sounds: No stridor.     Comments: Equal chest rise. No increased work of breathing. Abdominal:     General: There is no distension.     Palpations: Abdomen is soft.     Tenderness: There is no abdominal tenderness. There is no guarding or rebound.  Musculoskeletal:     Comments: Moves all extremities equally and without difficulty.  Skin:    General: Skin is warm and dry.     Capillary Refill: Capillary refill takes less than 2 seconds.  Neurological:     Mental Status: He is alert.     GCS: GCS eye subscore is 4. GCS verbal subscore is 5. GCS motor subscore is 6.     Comments: Slightly slurred speech, answers questions appropriately.  No facial droop or focal weakness.  Psychiatric:        Mood and Affect: Mood normal.      ED Treatments / Results  Labs (all labs ordered are listed, but only abnormal results are displayed) Labs Reviewed  CBC WITH DIFFERENTIAL/PLATELET - Abnormal; Notable for the following components:      Result Value   WBC 13.2 (*)    Hemoglobin 17.9 (*)    Lymphs Abs 5.9 (*)    All other components within normal limits  COMPREHENSIVE METABOLIC PANEL - Abnormal; Notable for the following components:   CO2 20 (*)    Calcium 8.8 (*)    Total Protein 8.2 (*)    All other components within normal limits  LIPASE, BLOOD - Abnormal; Notable for the following components:   Lipase 57 (*)    All other components within normal limits  ETHANOL - Abnormal; Notable for the following components:   Alcohol, Ethyl (B) 406 (*)    All other components within normal limits  RAPID URINE DRUG SCREEN, HOSP PERFORMED    EKG EKG Interpretation  Date/Time:  Saturday April 09 2018 02:27:50 EDT Ventricular Rate:  97 PR Interval:    QRS Duration: 97 QT Interval:  358 QTC Calculation: 455 R Axis:   75 Text  Interpretation:  Sinus rhythm No acute changes No significant change since last tracing Confirmed by Varney Biles [phone]) on 04/09/2018 3:06:55 AM    Procedures Procedures (including critical care time)  Medications Ordered in ED Medications  sodium chloride 0.9 % bolus 1,000 mL (0 mLs Intravenous Stopped 04/09/18 0423)     Initial Impression / Assessment and Plan / ED Course  I have reviewed the triage vital signs and the nursing notes.  Pertinent labs & imaging results that were available during my care of the patient were reviewed by me and considered in my medical decision making (see chart for details).  Clinical Course as of Apr 09 651  Sat Apr 09, 2018  0552 Pt more awake.  Denies pain or other symptoms.  Reports he still feels drunk. Will PO trial and ambulate.   [HM]  [phone] Pt is arousable, but is not clinically sober   [HM]    Clinical Course User Index [HM] Grisela Mesch, Jarrett Soho, PA-C       Pt presents intoxicated without specific complaint. Labs with mild leukocytosis, but pt is likely hemoconcentrated.  Fluids given.  Elevated ethanol level.  Pt denies drug usage.    5:59 AM Pt more awake, but not yet clinically sober.  Continues to deny pain.    6:53 AM Pt will need to sober.    At shift change care was transferred to [facility], PA-C who will follow pending studies, re-evaulate and determine disposition.     Final Clinical Impressions(s) / ED Diagnoses   Final diagnoses:  Alcoholic intoxication without complication [facility])    ED Discharge Orders    None       Eira Alpert, Gwenlyn Perking 04/09/18 [place], MD 04/09/18 2350

## 2018-04-09 NOTE — ED Notes (Signed)
Pt's significant other, Alyssa Brown [phone]. She reports pt has been drinking more than usual in the last week.

## 2018-06-13 DIAGNOSIS — F1094 Alcohol use, unspecified with alcohol-induced mood disorder: Secondary | ICD-10-CM | POA: Insufficient documentation

## 2018-07-10 DIAGNOSIS — F10939 Alcohol use, unspecified with withdrawal, unspecified: Secondary | ICD-10-CM | POA: Insufficient documentation

## 2018-09-27 DIAGNOSIS — F331 Major depressive disorder, recurrent, moderate: Secondary | ICD-10-CM | POA: Insufficient documentation

## 2018-12-26 ENCOUNTER — Emergency Department (HOSPITAL_COMMUNITY): Payer: Self-pay

## 2018-12-26 ENCOUNTER — Encounter (HOSPITAL_COMMUNITY): Payer: Self-pay

## 2018-12-26 ENCOUNTER — Other Ambulatory Visit: Payer: Self-pay

## 2018-12-26 ENCOUNTER — Inpatient Hospital Stay (HOSPITAL_COMMUNITY)
Admission: EM | Admit: 2018-12-26 | Discharge: 2018-12-29 | DRG: 897 | Disposition: A | Discharge status: Home / Self Care | Payer: Self-pay | Attending: Family Medicine | Admitting: Family Medicine

## 2018-12-26 DIAGNOSIS — E871 Hypo-osmolality and hyponatremia: Secondary | ICD-10-CM | POA: Diagnosis present

## 2018-12-26 DIAGNOSIS — F17210 Nicotine dependence, cigarettes, uncomplicated: Secondary | ICD-10-CM | POA: Diagnosis present

## 2018-12-26 DIAGNOSIS — G40909 Epilepsy, unspecified, not intractable, without status epilepticus: Secondary | ICD-10-CM | POA: Diagnosis present

## 2018-12-26 DIAGNOSIS — R0902 Hypoxemia: Secondary | ICD-10-CM | POA: Diagnosis present

## 2018-12-26 DIAGNOSIS — E876 Hypokalemia: Secondary | ICD-10-CM | POA: Diagnosis present

## 2018-12-26 DIAGNOSIS — R5381 Other malaise: Secondary | ICD-10-CM | POA: Diagnosis present

## 2018-12-26 DIAGNOSIS — Z9114 Patient's other noncompliance with medication regimen: Secondary | ICD-10-CM

## 2018-12-26 DIAGNOSIS — R030 Elevated blood-pressure reading, without diagnosis of hypertension: Secondary | ICD-10-CM | POA: Diagnosis present

## 2018-12-26 DIAGNOSIS — K760 Fatty (change of) liver, not elsewhere classified: Secondary | ICD-10-CM | POA: Diagnosis present

## 2018-12-26 DIAGNOSIS — R739 Hyperglycemia, unspecified: Secondary | ICD-10-CM | POA: Diagnosis present

## 2018-12-26 DIAGNOSIS — Z7141 Alcohol abuse counseling and surveillance of alcoholic: Secondary | ICD-10-CM

## 2018-12-26 DIAGNOSIS — F4310 Post-traumatic stress disorder, unspecified: Secondary | ICD-10-CM | POA: Diagnosis present

## 2018-12-26 DIAGNOSIS — F329 Major depressive disorder, single episode, unspecified: Secondary | ICD-10-CM | POA: Diagnosis present

## 2018-12-26 DIAGNOSIS — F1010 Alcohol abuse, uncomplicated: Secondary | ICD-10-CM

## 2018-12-26 DIAGNOSIS — Z720 Tobacco use: Secondary | ICD-10-CM | POA: Diagnosis present

## 2018-12-26 DIAGNOSIS — Z716 Tobacco abuse counseling: Secondary | ICD-10-CM

## 2018-12-26 DIAGNOSIS — M79661 Pain in right lower leg: Secondary | ICD-10-CM | POA: Diagnosis present

## 2018-12-26 DIAGNOSIS — Z20828 Contact with and (suspected) exposure to other viral communicable diseases: Secondary | ICD-10-CM | POA: Diagnosis present

## 2018-12-26 DIAGNOSIS — R441 Visual hallucinations: Secondary | ICD-10-CM | POA: Diagnosis present

## 2018-12-26 DIAGNOSIS — F1290 Cannabis use, unspecified, uncomplicated: Secondary | ICD-10-CM | POA: Diagnosis present

## 2018-12-26 DIAGNOSIS — F411 Generalized anxiety disorder: Secondary | ICD-10-CM | POA: Diagnosis present

## 2018-12-26 DIAGNOSIS — R000 Tachycardia, unspecified: Secondary | ICD-10-CM | POA: Diagnosis not present

## 2018-12-26 DIAGNOSIS — Y908 Blood alcohol level of 240 mg/100 ml or more: Secondary | ICD-10-CM | POA: Diagnosis present

## 2018-12-26 DIAGNOSIS — F10939 Alcohol use, unspecified with withdrawal, unspecified: Secondary | ICD-10-CM | POA: Diagnosis present

## 2018-12-26 DIAGNOSIS — Z811 Family history of alcohol abuse and dependence: Secondary | ICD-10-CM

## 2018-12-26 DIAGNOSIS — F1990 Other psychoactive substance use, unspecified, uncomplicated: Secondary | ICD-10-CM | POA: Diagnosis present

## 2018-12-26 DIAGNOSIS — F10239 Alcohol dependence with withdrawal, unspecified: Principal | ICD-10-CM | POA: Diagnosis present

## 2018-12-26 DIAGNOSIS — Z79899 Other long term (current) drug therapy: Secondary | ICD-10-CM

## 2018-12-26 DIAGNOSIS — F419 Anxiety disorder, unspecified: Secondary | ICD-10-CM | POA: Diagnosis present

## 2018-12-26 DIAGNOSIS — R7401 Elevation of levels of liver transaminase levels: Secondary | ICD-10-CM | POA: Diagnosis present

## 2018-12-26 LAB — COMPREHENSIVE METABOLIC PANEL
ALT: 161 U/L — ABNORMAL HIGH (ref 0–44)
ALT: 147 U/L — ABNORMAL HIGH (ref 0–44)
AST: 189 U/L — ABNORMAL HIGH (ref 15–41)
AST: 191 U/L — ABNORMAL HIGH (ref 15–41)
Albumin: 4.8 g/dL (ref 3.5–5.0)
Albumin: 4 g/dL (ref 3.5–5.0)
Alkaline Phosphatase: 48 U/L (ref 38–126)
Alkaline Phosphatase: 41 U/L (ref 38–126)
Anion gap: 20 — ABNORMAL HIGH (ref 5–15)
Anion gap: 13 (ref 5–15)
BUN: 6 mg/dL (ref 6–20)
BUN: 6 mg/dL (ref 6–20)
CO2: 24 mmol/L (ref 22–32)
CO2: 27 mmol/L (ref 22–32)
Calcium: 9.2 mg/dL (ref 8.9–10.3)
Calcium: 8.9 mg/dL (ref 8.9–10.3)
Chloride: 84 mmol/L — ABNORMAL LOW (ref 98–111)
Chloride: 95 mmol/L — ABNORMAL LOW (ref 98–111)
Creatinine, Ser: 0.65 mg/dL (ref 0.61–1.24)
Creatinine, Ser: 0.67 mg/dL (ref 0.61–1.24)
GFR calc Af Amer: >60 mL/min (ref >60)
GFR calc Af Amer: >60 mL/min (ref >60)
GFR calc non Af Amer: >60 mL/min (ref >60)
GFR calc non Af Amer: >60 mL/min (ref >60)
Glucose, Bld: 147 mg/dL — ABNORMAL HIGH (ref 70–99)
Glucose, Bld: 104 mg/dL — ABNORMAL HIGH (ref 70–99)
Potassium: 2.9 mmol/L — ABNORMAL LOW (ref 3.5–5.1)
Potassium: 3.3 mmol/L — ABNORMAL LOW (ref 3.5–5.1)
Sodium: 128 mmol/L — ABNORMAL LOW (ref 135–145)
Sodium: 135 mmol/L (ref 135–145)
Total Bilirubin: 0.8 mg/dL (ref 0.3–1.2)
Total Bilirubin: 1.1 mg/dL (ref 0.3–1.2)
Total Protein: 8 g/dL (ref 6.5–8.1)
Total Protein: 6.6 g/dL (ref 6.5–8.1)

## 2018-12-26 LAB — CBC WITH DIFFERENTIAL/PLATELET
Abs Immature Granulocytes: 0.01 10*3/uL (ref 0.00–0.07)
Basophils Absolute: 0.1 10*3/uL (ref 0.0–0.1)
Basophils Relative: 1 %
Eosinophils Absolute: 0 10*3/uL (ref 0.0–0.5)
Eosinophils Relative: 0 %
HCT: 40.8 % (ref 39.0–52.0)
Hemoglobin: 14.9 g/dL (ref 13.0–17.0)
Immature Granulocytes: 0 %
Lymphocytes Relative: 44 %
Lymphs Abs: 2.2 10*3/uL (ref 0.7–4.0)
MCH: 33.5 pg (ref 26.0–34.0)
MCHC: 36.5 g/dL — ABNORMAL HIGH (ref 30.0–36.0)
MCV: 91.7 fL (ref 80.0–100.0)
Monocytes Absolute: 0.5 10*3/uL (ref 0.1–1.0)
Monocytes Relative: 11 %
Neutro Abs: 2.2 10*3/uL (ref 1.7–7.7)
Neutrophils Relative %: 44 %
Platelets: 102 10*3/uL — ABNORMAL LOW (ref 150–400)
RBC: 4.45 MIL/uL (ref 4.22–5.81)
RDW: 12.3 % (ref 11.5–15.5)
WBC: 4.9 10*3/uL (ref 4.0–10.5)
nRBC: 0 % (ref 0.0–0.2)

## 2018-12-26 LAB — CBC
HCT: 36.1 % — ABNORMAL LOW (ref 39.0–52.0)
Hemoglobin: 13 g/dL (ref 13.0–17.0)
MCH: 34 pg (ref 26.0–34.0)
MCHC: 36 g/dL (ref 30.0–36.0)
MCV: 94.5 fL (ref 80.0–100.0)
Platelets: 79 10*3/uL — ABNORMAL LOW (ref 150–400)
RBC: 3.82 MIL/uL — ABNORMAL LOW (ref 4.22–5.81)
RDW: 12.7 % (ref 11.5–15.5)
WBC: 5.8 10*3/uL (ref 4.0–10.5)
nRBC: 0 % (ref 0.0–0.2)

## 2018-12-26 LAB — SARS CORONAVIRUS 2 (TAT 6-24 HRS): SARS Coronavirus 2: NEGATIVE

## 2018-12-26 LAB — RAPID URINE DRUG SCREEN, HOSP PERFORMED
Amphetamines: NOT DETECTED
Barbiturates: NOT DETECTED
Benzodiazepines: NOT DETECTED
Cocaine: NOT DETECTED
Opiates: NOT DETECTED
Tetrahydrocannabinol: NOT DETECTED

## 2018-12-26 LAB — MAGNESIUM: Magnesium: 1.6 mg/dL — ABNORMAL LOW (ref 1.7–2.4)

## 2018-12-26 LAB — PHOSPHORUS: Phosphorus: 2.7 mg/dL (ref 2.5–4.6)

## 2018-12-26 LAB — D-DIMER, QUANTITATIVE: D-Dimer, Quant: 0.59 ug/mL-FEU — ABNORMAL HIGH (ref 0.00–0.50)

## 2018-12-26 LAB — PROTIME-INR
INR: 1 (ref 0.8–1.2)
Prothrombin Time: 13.3 seconds (ref 11.4–15.2)

## 2018-12-26 LAB — ETHANOL: Alcohol, Ethyl (B): 336 mg/dL (ref <10)

## 2018-12-26 LAB — LIPASE, BLOOD: Lipase: 97 U/L — ABNORMAL HIGH (ref 11–51)

## 2018-12-26 MED ORDER — SODIUM CHLORIDE 0.9 % IV BOLUS
1000.0000 mL | Freq: Once | INTRAVENOUS | Status: AC
  Administered 2018-12-26: 08:00:00 1000 mL via INTRAVENOUS

## 2018-12-26 MED ORDER — LORAZEPAM 2 MG/ML IJ SOLN
0.0000 mg | INTRAMUSCULAR | Status: AC
  Administered 2018-12-27 – 2018-12-28 (×8): 1 mg via INTRAVENOUS
  Filled 2018-12-26 (×9): qty 1

## 2018-12-26 MED ORDER — LORAZEPAM 1 MG PO TABS
1.0000 mg | ORAL_TABLET | ORAL | Status: DC | PRN
  Administered 2018-12-26 – 2018-12-29 (×4): 1 mg via ORAL
  Filled 2018-12-26 (×4): qty 1

## 2018-12-26 MED ORDER — FOLIC ACID 1 MG PO TABS
1.0000 mg | ORAL_TABLET | Freq: Every day | ORAL | Status: DC
  Administered 2018-12-26 – 2018-12-29 (×4): 1 mg via ORAL
  Filled 2018-12-26 (×4): qty 1

## 2018-12-26 MED ORDER — IOHEXOL 350 MG/ML SOLN
100.0000 mL | Freq: Once | INTRAVENOUS | Status: AC | PRN
  Administered 2018-12-26: 100 mL via INTRAVENOUS

## 2018-12-26 MED ORDER — THIAMINE HCL 100 MG PO TABS
100.0000 mg | ORAL_TABLET | Freq: Every day | ORAL | Status: DC
  Administered 2018-12-26 – 2018-12-29 (×4): 100 mg via ORAL
  Filled 2018-12-26 (×4): qty 1

## 2018-12-26 MED ORDER — PROCHLORPERAZINE EDISYLATE 10 MG/2ML IJ SOLN: 5.0000 mg | INTRAMUSCULAR | Status: DC | PRN

## 2018-12-26 MED ORDER — LORAZEPAM 2 MG/ML IJ SOLN
2.0000 mg | Freq: Once | INTRAMUSCULAR | Status: AC
  Administered 2018-12-26: 23:00:00 2 mg via INTRAVENOUS
  Filled 2018-12-26: qty 1

## 2018-12-26 MED ORDER — MAGNESIUM SULFATE 2 GM/50ML IV SOLN
2.0000 g | Freq: Once | INTRAVENOUS | Status: AC
  Administered 2018-12-26: 21:00:00 2 g via INTRAVENOUS
  Filled 2018-12-26: qty 50

## 2018-12-26 MED ORDER — SODIUM CHLORIDE (PF) 0.9 % IJ SOLN
INTRAMUSCULAR | Status: AC
  Filled 2018-12-26: qty 50

## 2018-12-26 MED ORDER — THIAMINE HCL 100 MG/ML IJ SOLN
100.0000 mg | Freq: Every day | INTRAMUSCULAR | Status: DC
  Filled 2018-12-26: qty 1
  Filled 2018-12-26: qty 2
  Filled 2018-12-26: qty 1
  Filled 2018-12-26: qty 2

## 2018-12-26 MED ORDER — ADULT MULTIVITAMIN W/MINERALS CH
1.0000 | ORAL_TABLET | Freq: Every day | ORAL | Status: DC
  Administered 2018-12-26 – 2018-12-29 (×4): 1 via ORAL
  Filled 2018-12-26 (×4): qty 1

## 2018-12-26 MED ORDER — LORAZEPAM 2 MG/ML IJ SOLN
0.0000 mg | Freq: Three times a day (TID) | INTRAMUSCULAR | Status: DC
  Administered 2018-12-28: 20:00:00 1 mg via INTRAVENOUS
  Administered 2018-12-29: 2 mg via INTRAVENOUS
  Filled 2018-12-26 (×2): qty 1

## 2018-12-26 MED ORDER — LORAZEPAM 2 MG/ML IJ SOLN: 1.0000 mg | Freq: Once | INTRAMUSCULAR | Status: DC

## 2018-12-26 MED ORDER — POTASSIUM CHLORIDE IN NACL 40-0.9 MEQ/L-% IV SOLN
INTRAVENOUS | Status: DC
  Administered 2018-12-27: 01:00:00 100 mL/h via INTRAVENOUS
  Filled 2018-12-26 (×2): qty 1000

## 2018-12-26 MED ORDER — POTASSIUM CHLORIDE 10 MEQ/100ML IV SOLN
10.0000 meq | INTRAVENOUS | Status: AC
  Administered 2018-12-26 (×2): 10 meq via INTRAVENOUS
  Filled 2018-12-26 (×2): qty 100

## 2018-12-26 MED ORDER — SODIUM CHLORIDE 0.9 % IV BOLUS
1000.0000 mL | Freq: Once | INTRAVENOUS | Status: AC
  Administered 2018-12-26: 14:00:00 1000 mL via INTRAVENOUS

## 2018-12-26 MED ORDER — ONDANSETRON HCL 4 MG/2ML IJ SOLN
4.0000 mg | Freq: Once | INTRAMUSCULAR | Status: AC
  Administered 2018-12-26: 4 mg via INTRAVENOUS
  Filled 2018-12-26: qty 2

## 2018-12-26 MED ORDER — LORAZEPAM 2 MG/ML IJ SOLN
1.0000 mg | Freq: Once | INTRAMUSCULAR | Status: AC
  Administered 2018-12-26: 16:00:00 1 mg via INTRAVENOUS
  Filled 2018-12-26: qty 1

## 2018-12-26 MED ORDER — LORAZEPAM 2 MG/ML IJ SOLN
1.0000 mg | Freq: Once | INTRAMUSCULAR | Status: AC
  Administered 2018-12-26: 08:00:00 1 mg via INTRAVENOUS
  Filled 2018-12-26: qty 1

## 2018-12-26 MED ORDER — POTASSIUM CHLORIDE IN NACL 40-0.9 MEQ/L-% IV SOLN
INTRAVENOUS | Status: DC
  Administered 2018-12-26: 21:00:00 250 mL/h via INTRAVENOUS
  Filled 2018-12-26: qty 1000

## 2018-12-26 MED ORDER — LORAZEPAM 2 MG/ML IJ SOLN
1.0000 mg | INTRAMUSCULAR | Status: DC | PRN
  Administered 2018-12-27 – 2018-12-28 (×2): 1 mg via INTRAVENOUS
  Filled 2018-12-26: qty 1

## 2018-12-26 MED ORDER — THIAMINE HCL 100 MG/ML IJ SOLN
100.0000 mg | Freq: Once | INTRAMUSCULAR | Status: AC
  Administered 2018-12-26: 100 mg via INTRAVENOUS
  Filled 2018-12-26: qty 2
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
## 2018-12-26 NOTE — ED Notes (Signed)
Patient urinated in bed. Patient cleaned, bedding and gown changed. Patient directed to provide sample with urinal. Patient states understanding.

## 2018-12-26 NOTE — H&P (Signed)
History and Physical    Chad Morgan [id number] DOB: [birth year] DOA: 12/26/2018  PCP: Patient, No Pcp Per   Patient coming from: Home.  I have personally briefly reviewed patient's old medical records in [place]  Chief Complaint: Lower extremities pain and weakness.  HPI: Chad Morgan is a 38 y.o. male with medical history significant of alcohol abuse, anxiety, depression, PTSD, seizure disorder who is coming to the emergency department due to progressively worse pain in numbness on the feet for the past few days which is impairing his ability to walk.  He has been drinking about half a gallon of hard liquor daily.  He had last drank alcohol this morning.  No changes in appetite.  He denies fever, chills, sore throat, rhinorrhea, dyspnea, wheezing or hemoptysis.  No chest pain, but complains of dizziness and palpitations.  He denies lower extremity edema, PND or orthopnea.  Gets occasional abdominal pain, but denies recent nausea or emesis, no diarrhea, constipation, melena or hematochezia.  No dysuria, frequency or hematuria.  ED Course: His UDS was negative.  D-dimer 0.59 mcg milliliter.  CBC shows white count of 4.9, hemoglobin 14.9 g/dL and platelets 102.  Lipase 97, AST 889 ALT 161 units/L.  The rest of the hepatic function tests are within normal limits.  Renal function is unremarkable.  Sodium is 128, potassium 2.9, chloride 84 and CO2 24 mmol/L.  Glucose 147 and at the alcohol 336 mg/dL.  PT and INR were negative.  Imaging: One-view portable chest radiograph showed low volumes but was otherwise negative.  CTA chest was very limited due to respiratory motion artifact and was nondiagnosis.  Consider VQ scan.  Shallow inspiration with bibasilar linear atelectasis.  There might be a developing infiltrate at the right base, but this is less likely.  Please see images and full regular report for further detail.  Review of Systems: As per HPI otherwise 10 point review of  systems negative.   Past Medical History:  Diagnosis Date  . Alcohol abuse   . Anxiety   . Depression   . PTSD (post-traumatic stress disorder)   . Seizures ([place])    with withdrawal    Past Surgical History:  Procedure Laterality Date  . WISDOM TOOTH EXTRACTION       reports that he has been smoking cigarettes. He has been smoking about 0.00 packs per day for the past 16.00 years. He has never used smokeless tobacco. He reports current alcohol use. He reports current drug use. Drugs: Benzodiazepines and Marijuana.  No Known Allergies  Family History  Problem Relation Age of Onset  . Alcoholism Father    Prior to Admission medications   Medication Sig Start Date End Date Taking? Authorizing Provider  chlordiazePOXIDE (LIBRIUM) 25 MG capsule 50mg  PO TID x 1D, then 25-50mg  PO BID X 1D, then 25-50mg  PO QD X 1D Patient not taking: Reported on 12/26/2018 04/09/18   Ward, Ozella Almond, PA-C  FLUoxetine (PROZAC) 20 MG capsule Take 1 capsule (20 mg total) by mouth daily. For mood control Patient not taking: Reported on 12/26/2018 05/14/17   Money, Lowry Ram, FNP  folic acid (FOLVITE) 1 MG tablet Take 1 tablet (1 mg total) by mouth daily. Patient not taking: Reported on 12/26/2018 05/18/17   Aline August, MD  gabapentin (NEURONTIN) 300 MG capsule Take 1 capsule (300 mg total) by mouth 3 (three) times daily. For alcohol withdrawal Patient not taking: Reported on 12/26/2018 05/13/17   Money, Lowry Ram, FNP  hydrOXYzine (ATARAX/VISTARIL) 50 MG tablet Take 1 tablet (50 mg total) by mouth 3 (three) times daily as needed for anxiety. Patient not taking: Reported on 12/26/2018 05/11/17   [name], NP  Multiple Vitamin (MULTIVITAMIN WITH MINERALS) TABS tablet Take 1 tablet by mouth daily. Patient not taking: Reported on 12/26/2018 05/18/17   Aline August, MD  naltrexone (DEPADE) 50 MG tablet Take 0.5 tablets (25 mg total) by mouth daily. For cravings Patient not taking: Reported on 12/26/2018  05/14/17   Money, Lowry Ram, FNP  ondansetron (ZOFRAN ODT) 4 MG disintegrating tablet Take 1 tablet (4 mg total) by mouth every 8 (eight) hours as needed for nausea or vomiting. Patient not taking: Reported on 12/26/2018 04/09/18   Ward, Ozella Almond, PA-C  thiamine 100 MG tablet Take 1 tablet (100 mg total) by mouth daily. Patient not taking: Reported on 12/26/2018 05/18/17   Aline August, MD    Physical Exam: Vitals:   12/26/18 1930 12/26/18 2045 12/26/18 2104 12/26/18 2207  BP: 117/74  (!) 99/58 103/83  Pulse: (!) 117 (!) 101 (!) 108 88  Resp:   20 17  Temp:      TempSrc:      SpO2: 90%  95% 95%  Weight:      Height:        Constitutional: Mildly anxious, but otherwise NAD. Eyes: PERRL, lids and conjunctivae normal ENMT: Mucous membranes are moist. Posterior pharynx clear of any exudate or lesions. Neck: normal, supple, no masses, no thyromegaly Respiratory: clear to auscultation bilaterally, no wheezing, no crackles. Normal respiratory effort. No accessory muscle use.  Cardiovascular: Tachycardic at 104 bpm with a regular rhythm, no murmurs / rubs / gallops. No extremity edema. 2+ pedal pulses. No carotid bruits.  Abdomen: Nondistended, soft, no tenderness, no masses palpated. No hepatosplenomegaly. Bowel sounds positive.  Musculoskeletal: no clubbing / cyanosis.  Good ROM, no contractures. Normal muscle tone.  Skin: no rashes, lesions, ulcers. No induration Neurologic: Moderate intensity tremors, otherwise CN 2-12 grossly intact.  Subjective decreased sensation on feet, DTR normal.  Generalized, nonfocal weakness. Psychiatric:  Alert and oriented x 3.  Mildly anxious.  Mood.   Labs on Admission: I have personally reviewed following labs and imaging studies  CBC: Recent Labs  Lab 12/26/18 0808 12/26/18 2048  WBC 4.9 5.8  NEUTROABS 2.2  --   HGB 14.9 13.0  HCT 40.8 36.1*  MCV 91.7 94.5  PLT 102* 79*   Basic Metabolic Panel: Recent Labs  Lab 12/26/18 0808  NA 128*    K 2.9*  CL 84*  CO2 24  GLUCOSE 147*  BUN 6  CREATININE 0.65  CALCIUM 9.2   GFR: Estimated Creatinine Clearance: 147 mL/min (by C-G formula based on SCr of 0.65 mg/dL). Liver Function Tests: Recent Labs  Lab 12/26/18 0808  AST 189*  ALT 161*  ALKPHOS 48  BILITOT 0.8  PROT 8.0  ALBUMIN 4.8   Recent Labs  Lab 12/26/18 0808  LIPASE 97*   No results for input(s): AMMONIA in the last 168 hours. Coagulation Profile: Recent Labs  Lab 12/26/18 2048  INR 1.0   Cardiac Enzymes: No results for input(s): CKTOTAL, CKMB, CKMBINDEX, TROPONINI in the last 168 hours. BNP (last 3 results) No results for input(s): PROBNP in the last 8760 hours. HbA1C: No results for input(s): HGBA1C in the last 72 hours. CBG: No results for input(s): GLUCAP in the last 168 hours. Lipid Profile: No results for input(s): CHOL, HDL, LDLCALC, TRIG, CHOLHDL, LDLDIRECT in  the last 72 hours. Thyroid Function Tests: No results for input(s): TSH, T4TOTAL, FREET4, T3FREE, THYROIDAB in the last 72 hours. Anemia Panel: No results for input(s): VITAMINB12, FOLATE, FERRITIN, TIBC, IRON, RETICCTPCT in the last 72 hours. Urine analysis:    Component Value Date/Time   COLORURINE YELLOW 04/16/2014 1121   APPEARANCEUR CLOUDY (A) 04/16/2014 1121   LABSPEC 1.023 04/16/2014 1121   PHURINE 5.5 04/16/2014 1121   GLUCOSEU NEGATIVE 04/16/2014 1121   HGBUR NEGATIVE 04/16/2014 1121   BILIRUBINUR NEGATIVE 04/16/2014 1121   KETONESUR NEGATIVE 04/16/2014 1121   PROTEINUR NEGATIVE 04/16/2014 1121   UROBILINOGEN 0.2 04/16/2014 1121   NITRITE NEGATIVE 04/16/2014 1121   LEUKOCYTESUR NEGATIVE 04/16/2014 1121    Radiological Exams on Admission: CT Angio Chest PE W and/or Wo Contrast  Result Date: 12/26/2018 CLINICAL DATA:  38 year old male with shortness of breath. EXAM: CT ANGIOGRAPHY CHEST WITH CONTRAST TECHNIQUE: Multidetector CT imaging of the chest was performed using the standard protocol during bolus  administration of intravenous contrast. Multiplanar CT image reconstructions and MIPs were obtained to evaluate the vascular anatomy. CONTRAST:  153mL OMNIPAQUE IOHEXOL 350 MG/ML SOLN COMPARISON:  Chest radiograph dated 12/26/2018. FINDINGS: Evaluation of this exam is limited due to respiratory motion artifact. Cardiovascular: There is no cardiomegaly or pericardial effusion. The thoracic aorta is unremarkable. The origins of the great vessels of the aortic arch appear patent as visualized. Evaluation of the pulmonary arteries is very limited, almost nondiagnostic due to suboptimal opacification and timing of the contrast as well as secondary to respiratory motion artifact. Repeat CT or V/Q scan may provide better evaluation if there is high clinical concern for acute PE. No obvious large central pulmonary artery embolus identified. Mediastinum/Nodes: No hilar or mediastinal adenopathy. The esophagus and the thyroid gland are grossly unremarkable. No mediastinal fluid collection. Lungs/Pleura: There is shallow inspiration with bibasilar linear atelectasis. Developing infiltrate at the right lung base is less likely but not excluded. Clinical correlation is recommended. No pleural effusion, or pneumothorax. The central airways are patent. Upper Abdomen: Diffuse fatty infiltration of the liver. The visualized upper abdomen is otherwise unremarkable. Musculoskeletal: No chest wall abnormality. No acute or significant osseous findings. Review of the MIP images confirms the above findings. IMPRESSION: 1. Very limited study due to respiratory motion artifact and timing of the contrast as well as respiratory motion artifact. No obvious large central pulmonary artery embolus identified. Repeat CT or V/Q scan may provide better evaluation if there is high clinical concern for acute PE. 2. Shallow inspiration with bibasilar linear atelectasis. Developing infiltrate at the right lung base is less likely but not excluded.  Clinical correlation is recommended. 3. Fatty liver. Electronically Signed   By: Anner Crete M.D.   On: 12/26/2018 16:33   DG Chest Port 1 View  Result Date: 12/26/2018 CLINICAL DATA:  Hypoxia EXAM: PORTABLE CHEST 1 VIEW COMPARISON:  05/16/2017 FINDINGS: Low volume chest with mild interstitial crowding. There is no edema, consolidation, effusion, or pneumothorax. Normal heart size and mediastinal contours. Artifact from EKG leads. IMPRESSION: Negative low volume chest. Electronically Signed   By: Monte Fantasia M.D.   On: 12/26/2018 11:13    EKG: Independently reviewed. Vent. rate 109 BPM PR interval * ms QRS duration 105 ms QT/QTc 364/491 ms P-R-T axes -37 66 49 Sinus tachycardia Borderline prolonged QT interva  Assessment/Plan Principal Problem:   Alcohol withdrawal ([name]) Stepdown/inpatient. Continue IV fluids. Continue CIWA protocol. MVI, folic acid and thiamine. Magnesium sulfate 2 g IVPB. Check phosphorus level.  Active Problems:   Tobacco abuse Nicotine replacement therapy offered. Tobacco cessation information to be provided by the staff.    Depression Currently not taking medications. Consider [facility] consult in the morning.    Hypokalemia Replacing. Check magnesium level. Follow-up potassium level.    Hyponatremia Likely hemodilution. Continue NS infusion. Follow-up sodium level.    Hypomagnesemia Replacement given. Follow-up magnesium level as needed    Transaminitis Likely due to alcohol consumption. Follow AST/ALT in a.m.    Hyperglycemia This was a nonfasting level. Repeat fasting glucose in a.m.   DVT prophylaxis: SCDs. Code Status: Full code. Family Communication: Disposition Plan: Observation for electrolyte replacement, CIWA protocol. Consults called: Admission status: Observation/stepdown.   Reubin Milan MD [facility]  If 7PM-7AM, please contact night-coverage [email or web address]  12/26/2018, 10:08 PM   This  document was prepared using Dragon voice recognition software and may contain some unintended transcription errors.

## 2018-12-26 NOTE — ED Notes (Signed)
Urinal at bedside. Patient made aware we need UA.

## 2018-12-26 NOTE — ED Notes (Signed)
02 decreased to 88% RA patient repositioned. 02 back up to 94% RA.

## 2018-12-26 NOTE — ED Provider Notes (Signed)
  Physical Exam  BP 107/74   Pulse (!) 110   Temp 97.8 F (36.6 C) (Oral)   Resp 20   Ht 6\' 2"  (1.88 m)   Wt 90.7 kg   SpO2 92%   BMI 25.68 kg/m   Physical Exam  ED Course/Procedures     Procedures  MDM  Patient presents with few different complaints.  Leg pain numbness difficulty walking.  Alcohol abuse.  History of alcohol abuse.  However was found to be hypoxic here.  States has been having a little bit of a cough.  Still tremulous.  Received patient signout.  D-dimer event positive due to hypoxia and leg pain.  CT scan done but nondiagnostic due to respiratory movement.  Still tremulousness despite Ativan.  I do not think he will be able to control his breathing well enough now to tolerate another CT scan.  LFT is somewhat elevated as is the lipase.  I feel patient benefit from admission to the hospital for control of his alcohol withdrawal and hypokalemia.  May require second CT scan.  Does not have a PCP.       Davonna Belling, MD 12/26/18 [phone]

## 2018-12-26 NOTE — ED Notes (Addendum)
02 decreased to 84% room air. Patient placed on 2 liters 02.   Lita Mains, MD made aware

## 2018-12-26 NOTE — ED Notes (Signed)
Patient ambulated by nurse tech. O2 sat 91% on RA when ambulated in room.

## 2018-12-26 NOTE — ED Triage Notes (Signed)
Patient c/o bilateral leg pain and numbness of feet since may 2020, but states he is unable to walk normally due to pain in the past few days.  Patient also is requesting detox from Alcohol. Patient's last drink ws this AM. Patient states he has been sipping on Loco this AM.  Patient denies any SI/HI, visual or auditory hallucinations.

## 2018-12-26 NOTE — ED Notes (Signed)
Patient unable to provide urine sample at this time. Patient given urinal at bedside.

## 2018-12-27 ENCOUNTER — Encounter (HOSPITAL_COMMUNITY): Payer: Self-pay | Admitting: Internal Medicine

## 2018-12-27 DIAGNOSIS — E876 Hypokalemia: Secondary | ICD-10-CM

## 2018-12-27 DIAGNOSIS — F10939 Alcohol use, unspecified with withdrawal, unspecified: Secondary | ICD-10-CM | POA: Diagnosis present

## 2018-12-27 DIAGNOSIS — E871 Hypo-osmolality and hyponatremia: Secondary | ICD-10-CM

## 2018-12-27 DIAGNOSIS — R7401 Elevation of levels of liver transaminase levels: Secondary | ICD-10-CM

## 2018-12-27 DIAGNOSIS — F10230 Alcohol dependence with withdrawal, uncomplicated: Secondary | ICD-10-CM

## 2018-12-27 DIAGNOSIS — F10239 Alcohol dependence with withdrawal, unspecified: Secondary | ICD-10-CM | POA: Diagnosis present

## 2018-12-27 LAB — BASIC METABOLIC PANEL WITH GFR
Anion gap: 11 (ref 5–15)
BUN: 8 mg/dL (ref 6–20)
CO2: 28 mmol/L (ref 22–32)
Calcium: 9.1 mg/dL (ref 8.9–10.3)
Chloride: 96 mmol/L — ABNORMAL LOW (ref 98–111)
Creatinine, Ser: 0.68 mg/dL (ref 0.61–1.24)
GFR calc Af Amer: >60 mL/min
GFR calc non Af Amer: >60 mL/min
Glucose, Bld: 100 mg/dL — ABNORMAL HIGH (ref 70–99)
Potassium: 4.5 mmol/L (ref 3.5–5.1)
Sodium: 135 mmol/L (ref 135–145)

## 2018-12-27 LAB — MAGNESIUM: Magnesium: 2.1 mg/dL (ref 1.7–2.4)

## 2018-12-27 LAB — HIV ANTIBODY (ROUTINE TESTING W REFLEX): HIV Screen 4th Generation wRfx: NONREACTIVE

## 2018-12-27 LAB — PHOSPHORUS: Phosphorus: 2.8 mg/dL (ref 2.5–4.6)

## 2018-12-27 MED ORDER — NICOTINE 21 MG/24HR TD PT24: 21.0000 mg | MEDICATED_PATCH | Freq: Every day | TRANSDERMAL | Status: DC | PRN

## 2018-12-27 NOTE — ED Notes (Signed)
Patient given meal tray and warm blanket.

## 2018-12-27 NOTE — Progress Notes (Signed)
NP spoke to RN, last CIWA score 3, no severe withdrawal symptoms, will discontinue  Tele.

## 2018-12-27 NOTE — ED Notes (Signed)
Patient given meal tray.

## 2018-12-27 NOTE — ED Notes (Signed)
Patient ambulated to restroom with one assist.

## 2018-12-27 NOTE — ED Notes (Signed)
Patient given meal tray. Patient sleeping.

## 2018-12-27 NOTE — Progress Notes (Signed)
Patient has an order for cardiac monitor, we don't have telemetry on this floor. On call was paged to see if patient needs cardiac monitoring.

## 2018-12-27 NOTE — ED Notes (Signed)
Patient ambulated to restroom with no assist.

## 2018-12-27 NOTE — Progress Notes (Signed)
Progress Note    Chad Morgan  [id number] DOB: 1980-09-21  DOA: 12/26/2018 PCP: Patient, No Pcp Per        Assessment/Plan:   Principal Problem:   Alcohol withdrawal ([place]) Active Problems:   Tobacco abuse   Depression   Hypokalemia   Hyponatremia   Transaminitis   Hyperglycemia   Alcohol withdrawal syndrome ([name])   Body mass index is 25.68 kg/m.   Alcohol withdrawal syndrome: Continue thiamine, folic acid and Ativan per CIWA protocol.  Counseled to quit drinking alcohol.  Consult social worker to assist with outpatient rehab resources.  Hyponatremia: Improved.  Hypokalemia and hypomagnesemia: Improved  Elevated liver enzymes/fatty liver: This is likely due to alcohol use disorder.  Outpatient follow-up recommended.  Tobacco abuse: Counseled to quit smoking cigarettes.   Family Communication/Anticipated D/C date and plan/Code Status   DVT prophylaxis: SCDs Code Status: Full code Family Communication: Plan discussed with the patient Disposition Plan: For discharge to home tomorrow      Subjective:   He complains of shaking all over.  He had diarrhea this morning.  No vomiting or abdominal pain.  Overall, he feels a little better.  Objective:    Vitals:   12/27/18 1030 12/27/18 1130 12/27/18 1152 12/27/18 1200  BP: (!) 141/92 114/73 114/73 127/68  Pulse: 96 85 90 85  Resp: 17 16  18   Temp:      TempSrc:      SpO2: 95% 97%  97%  Weight:      Height:        Intake/Output Summary (Last 24 hours) at 12/27/2018 1243 Last data filed at 12/27/2018 1036 Gross per 24 hour  Intake 2850 ml  Output --  Net 2850 ml   Filed Weights   12/26/18 0717  Weight: 90.7 kg    Exam:  GEN: NAD SKIN: No rash EYES: EOMI, anicteric ENT: MMM CV: RRR PULM: CTA B ABD: soft, ND, NT, +BS CNS: AAO x 3, non focal EXT: No edema or tenderness   Data Reviewed:   I have personally reviewed following labs and imaging studies:  Labs: Labs show the  following:   Basic Metabolic Panel: Recent Labs  Lab 12/26/18 0808 12/26/18 2048 12/27/18 0849  NA 128* 135 135  K 2.9* 3.3* 4.5  CL 84* 95* 96*  CO2 24 27 28   GLUCOSE 147* 104* 100*  BUN 6 6 8   CREATININE 0.65 0.67 0.68  CALCIUM 9.2 8.9 9.1  MG  --  1.6* 2.1  PHOS  --  2.7 2.8   GFR Estimated Creatinine Clearance: 147 mL/min (by C-G formula based on SCr of 0.68 mg/dL). Liver Function Tests: Recent Labs  Lab 12/26/18 0808 12/26/18 2048  AST 189* 191*  ALT 161* 147*  ALKPHOS 48 41  BILITOT 0.8 1.1  PROT 8.0 6.6  ALBUMIN 4.8 4.0   Recent Labs  Lab 12/26/18 0808  LIPASE 97*   No results for input(s): AMMONIA in the last 168 hours. Coagulation profile Recent Labs  Lab 12/26/18 2048  INR 1.0    CBC: Recent Labs  Lab 12/26/18 0808 12/26/18 2048  WBC 4.9 5.8  NEUTROABS 2.2  --   HGB 14.9 13.0  HCT 40.8 36.1*  MCV 91.7 94.5  PLT 102* 79*   Cardiac Enzymes: No results for input(s): CKTOTAL, CKMB, CKMBINDEX, TROPONINI in the last 168 hours. BNP (last 3 results) No results for input(s): PROBNP in the last 8760 hours. CBG: No results for input(s): GLUCAP  in the last 168 hours. D-Dimer: Recent Labs    12/26/18 1410  DDIMER 0.59*   Hgb A1c: No results for input(s): HGBA1C in the last 72 hours. Lipid Profile: No results for input(s): CHOL, HDL, LDLCALC, TRIG, CHOLHDL, LDLDIRECT in the last 72 hours. Thyroid function studies: No results for input(s): TSH, T4TOTAL, T3FREE, THYROIDAB in the last 72 hours.  Invalid input(s): FREET3 Anemia work up: No results for input(s): VITAMINB12, FOLATE, FERRITIN, TIBC, IRON, RETICCTPCT in the last 72 hours. Sepsis Labs: Recent Labs  Lab 12/26/18 0808 12/26/18 2048  WBC 4.9 5.8    Microbiology Recent Results (from the past 240 hour(s))  SARS CORONAVIRUS 2 (TAT 6-24 HRS) Nasopharyngeal Nasopharyngeal Swab     Status: None   Collection Time: 12/26/18  3:23 PM   Specimen: Nasopharyngeal Swab  Result Value Ref  Range Status   SARS Coronavirus 2 NEGATIVE NEGATIVE Final    Comment: (NOTE) SARS-CoV-2 target nucleic acids are NOT DETECTED. The SARS-CoV-2 RNA is generally detectable in upper and lower respiratory specimens during the acute phase of infection. Negative results do not preclude SARS-CoV-2 infection, do not rule out co-infections with other pathogens, and should not be used as the sole basis for treatment or other patient management decisions. Negative results must be combined with clinical observations, patient history, and epidemiological information. The expected result is Negative. Fact Sheet for Patients: SugarRoll.be Fact Sheet for Healthcare Providers: https://www.woods-mathews.com/ This test is not yet approved or cleared by the [place] FDA and  has been authorized for detection and/or diagnosis of SARS-CoV-2 by FDA under an Emergency Use Authorization (EUA). This EUA will remain  in effect (meaning this test can be used) for the duration of the COVID-19 declaration under Section 56 4(b)(1) of the Act, 21 U.S.C. section 360bbb-3(b)(1), unless the authorization is terminated or revoked sooner. Performed at [facility], [place] [address] [place], [place] [address]     Procedures and diagnostic studies:  CT Angio Chest PE W and/or Wo Contrast  Result Date: 12/26/2018 CLINICAL DATA:  38 year old male with shortness of breath. EXAM: CT ANGIOGRAPHY CHEST WITH CONTRAST TECHNIQUE: Multidetector CT imaging of the chest was performed using the standard protocol during bolus administration of intravenous contrast. Multiplanar CT image reconstructions and MIPs were obtained to evaluate the vascular anatomy. CONTRAST:  174mL OMNIPAQUE IOHEXOL 350 MG/ML SOLN COMPARISON:  Chest radiograph dated 12/26/2018. FINDINGS: Evaluation of this exam is limited due to respiratory motion artifact. Cardiovascular: There is no cardiomegaly or  pericardial effusion. The thoracic aorta is unremarkable. The origins of the great vessels of the aortic arch appear patent as visualized. Evaluation of the pulmonary arteries is very limited, almost nondiagnostic due to suboptimal opacification and timing of the contrast as well as secondary to respiratory motion artifact. Repeat CT or V/Q scan may provide better evaluation if there is high clinical concern for acute PE. No obvious large central pulmonary artery embolus identified. Mediastinum/Nodes: No hilar or mediastinal adenopathy. The esophagus and the thyroid gland are grossly unremarkable. No mediastinal fluid collection. Lungs/Pleura: There is shallow inspiration with bibasilar linear atelectasis. Developing infiltrate at the right lung base is less likely but not excluded. Clinical correlation is recommended. No pleural effusion, or pneumothorax. The central airways are patent. Upper Abdomen: Diffuse fatty infiltration of the liver. The visualized upper abdomen is otherwise unremarkable. Musculoskeletal: No chest wall abnormality. No acute or significant osseous findings. Review of the MIP images confirms the above findings. IMPRESSION: 1. Very limited study due to  respiratory motion artifact and timing of the contrast as well as respiratory motion artifact. No obvious large central pulmonary artery embolus identified. Repeat CT or V/Q scan may provide better evaluation if there is high clinical concern for acute PE. 2. Shallow inspiration with bibasilar linear atelectasis. Developing infiltrate at the right lung base is less likely but not excluded. Clinical correlation is recommended. 3. Fatty liver. Electronically Signed   By: Anner Crete M.D.   On: 12/26/2018 16:33   DG Chest Port 1 View  Result Date: 12/26/2018 CLINICAL DATA:  Hypoxia EXAM: PORTABLE CHEST 1 VIEW COMPARISON:  05/16/2017 FINDINGS: Low volume chest with mild interstitial crowding. There is no edema, consolidation, effusion, or  pneumothorax. Normal heart size and mediastinal contours. Artifact from EKG leads. IMPRESSION: Negative low volume chest. Electronically Signed   By: Monte Fantasia M.D.   On: 12/26/2018 11:13    Medications:   . folic acid  1 mg Oral Daily  . LORazepam  0-4 mg Intravenous Q4H   Followed by  . [START ON 12/28/2018] LORazepam  0-4 mg Intravenous Q8H  . multivitamin with minerals  1 tablet Oral Daily  . thiamine  100 mg Oral Daily   Or  . thiamine  100 mg Intravenous Daily   Continuous Infusions:   LOS: 0 days   Ahyana Skillin  [facility]   *Please refer to [email or web address], password [email or web address] to get updated schedule on who will round on this patient, as hospitalists switch teams weekly. If 7PM-7AM, please contact night-coverage at [email or web address], password [email or web address] for any overnight needs.  12/27/2018, 12:43 PM

## 2018-12-28 ENCOUNTER — Inpatient Hospital Stay (HOSPITAL_COMMUNITY): Payer: Medicaid Other

## 2018-12-28 DIAGNOSIS — M79609 Pain in unspecified limb: Secondary | ICD-10-CM

## 2018-12-28 LAB — HEPATIC FUNCTION PANEL
ALT: 214 U/L — ABNORMAL HIGH (ref 0–44)
AST: 289 U/L — ABNORMAL HIGH (ref 15–41)
Albumin: 3.8 g/dL (ref 3.5–5.0)
Alkaline Phosphatase: 42 U/L (ref 38–126)
Bilirubin, Direct: 0.2 mg/dL (ref 0.0–0.2)
Indirect Bilirubin: 0.7 mg/dL (ref 0.3–0.9)
Total Bilirubin: 0.9 mg/dL (ref 0.3–1.2)
Total Protein: 6.5 g/dL (ref 6.5–8.1)

## 2018-12-28 LAB — POTASSIUM: Potassium: 3.4 mmol/L — ABNORMAL LOW (ref 3.5–5.1)

## 2018-12-28 MED ORDER — KETOROLAC TROMETHAMINE 15 MG/ML IJ SOLN: 15.0000 mg | Freq: Three times a day (TID) | INTRAMUSCULAR | Status: DC | PRN

## 2018-12-28 MED ORDER — AMLODIPINE BESYLATE 5 MG PO TABS
5.0000 mg | ORAL_TABLET | Freq: Every day | ORAL | Status: DC
  Administered 2018-12-28 – 2018-12-29 (×2): 5 mg via ORAL
  Filled 2018-12-28: qty 1

## 2018-12-28 MED ORDER — POTASSIUM CHLORIDE CRYS ER 20 MEQ PO TBCR
40.0000 meq | EXTENDED_RELEASE_TABLET | Freq: Once | ORAL | Status: AC
  Administered 2018-12-28: 40 meq via ORAL

## 2018-12-28 MED ORDER — SODIUM CHLORIDE 0.9 % IV SOLN: INTRAVENOUS | Status: DC

## 2018-12-28 MED ORDER — ONDANSETRON HCL 4 MG/2ML IJ SOLN: 4.0000 mg | Freq: Four times a day (QID) | INTRAMUSCULAR | Status: DC | PRN

## 2018-12-28 MED ORDER — PANTOPRAZOLE SODIUM 40 MG PO TBEC
40.0000 mg | DELAYED_RELEASE_TABLET | Freq: Every day | ORAL | Status: DC
  Administered 2018-12-28 – 2018-12-29 (×2): 40 mg via ORAL
  Filled 2018-12-28: qty 1

## 2018-12-28 NOTE — Progress Notes (Signed)
Right lower extremity venous duplex has been completed. Preliminary results can be found in CV Proc through chart review.   12/28/18 10:34 AM Chad Morgan RVT

## 2018-12-28 NOTE — Progress Notes (Signed)
Progress Note    Chad Morgan  [id number] DOB: 07-02-1980  DOA: 12/26/2018 PCP: Patient, No Pcp Per     Brief Hospital Course:  38 y.o. male with medical history significant of alcohol abuse, anxiety, depression, PTSD, seizure disorder who is coming to the emergency department due to progressively worse pain in numbness on the feet for the past few days which is impairing his ability to walk.  He has been drinking about half a gallon of hard liquor daily.  He had last drank alcohol this morning of admission day. CBC shows white count of 4.9, hemoglobin 14.9 g/dL and platelets 102.  Lipase 97, AST 889 ALT 161 units/L.  The rest of the hepatic function tests are within normal limits.  Renal function is unremarkable.  Sodium is 128, potassium 2.9, chloride 84 and CO2 24 mmol/L.  Glucose 147 and at the alcohol 336 mg/dL.  PT and INR were negative.  CT angio negative for any PE.  Was admitted with alcohol intoxication/and withdrawal.  Assessment/Plan:   Principal Problem:   Alcohol withdrawal ([name]) Active Problems:   Tobacco abuse   Depression   Hypokalemia   Hyponatremia   Transaminitis   Hyperglycemia   Alcohol withdrawal syndrome ([name])   Body mass index is 25.68 kg/m.  Alcohol withdrawal syndrome: Improving however he still shows shakiness on his hands and tachycardia this morning.   -Patient does not appear to be fluent -Also found to be tachycardic -Continue thiamine, folic acid and Ativan per CIWA protocol.   -Requires continuous monitoring in hospital - Counseled to quit drinking alcohol.   - Consult social worker to assist with outpatient rehab resources.  Hyponatremia: Improved.  Hypokalemia and hypomagnesemia: Fluctuating -Status post replacement -Monitor labs  Elevated liver enzymes/fatty liver: Worsening today  - this is likely due to alcohol use disorder.   -We will continue to monitor for downward trending. -Avoid any hepatotoxic agents including  Tylenol - Outpatient follow-up recommended.  Tobacco abuse: Counseled to quit smoking cigarettes. -Provide nicotine patch  Right calf pain: Likely chronic along with heel pain which he says has been there for a couple of months -DVT was ruled out by venous Doppler of the right lower leg this morning as physical exam was concerning for tenderness on the right calf  Elevated blood pressure: Highest found to be 150/98 this afternoon -I have slightly secondary to alcohol withdrawal -We will keep patient on as needed antihypertensive -Continue cardiac monitor  Debility: Patient states he uses DME/walker to ambulate, which he has been having trouble with currently -We will consult PT OT for eval treat and DC recommendation  Family Communication/Anticipated D/C date and plan/Code Status   DVT prophylaxis: SCDs Code Status: Full code Family Communication: Plan discussed with the patient Disposition Plan: Likely discharge to home tomorrow   Subjective:   This morning patient again complaining of shakiness and feeling palpitation after having his breakfast.  Patient also complaining of right calf pain.  Denies any hallucination.  Objective:    Vitals:   12/27/18 1832 12/27/18 2118 12/28/18 0528 12/28/18 1400  BP: 129/77 (!) 115/92 127/73 (!) 150/98  Pulse: 93 75 84 93  Resp: 16 16 16 16   Temp: 98 F (36.7 C) 98.6 F (37 C) 98.9 F (37.2 C) 98 F (36.7 C)  TempSrc: Oral Oral Oral Oral  SpO2: 97% 98% 98% 97%  Weight:      Height:        Intake/Output Summary (Last 24  hours) at 12/28/2018 1413 Last data filed at 12/28/2018 1011 Gross per 24 hour  Intake 420 ml  Output 1575 ml  Net -1155 ml   Filed Weights   12/26/18 0717  Weight: 90.7 kg    Exam:  GEN: NAD.  Slow speech SKIN: No rash EYES: EOMI, anicteric ENT: MMM CV: Tachycardic; regular rhythm PULM: CTA B ABD: soft, ND, NT, +BS CNS: AAO x 3, non focal EXT: No edema.  Positive right calf tenderness   Data  Reviewed:   I have personally reviewed following labs and imaging studies:  Labs: Labs show the following:   Basic Metabolic Panel: Recent Labs  Lab 12/26/18 0808 12/26/18 2048 12/27/18 0849 12/28/18 0804  NA 128* 135 135  --   K 2.9* 3.3* 4.5 3.4*  CL 84* 95* 96*  --   CO2 24 27 28   --   GLUCOSE 147* 104* 100*  --   BUN 6 6 8   --   CREATININE 0.65 0.67 0.68  --   CALCIUM 9.2 8.9 9.1  --   MG  --  1.6* 2.1  --   PHOS  --  2.7 2.8  --    GFR Estimated Creatinine Clearance: 147 mL/min (by C-G formula based on SCr of 0.68 mg/dL). Liver Function Tests: Recent Labs  Lab 12/26/18 0808 12/26/18 2048 12/28/18 0804  AST 189* 191* 289*  ALT 161* 147* 214*  ALKPHOS 48 41 42  BILITOT 0.8 1.1 0.9  PROT 8.0 6.6 6.5  ALBUMIN 4.8 4.0 3.8   Recent Labs  Lab 12/26/18 0808  LIPASE 97*   No results for input(s): AMMONIA in the last 168 hours. Coagulation profile Recent Labs  Lab 12/26/18 2048  INR 1.0    CBC: Recent Labs  Lab 12/26/18 0808 12/26/18 2048  WBC 4.9 5.8  NEUTROABS 2.2  --   HGB 14.9 13.0  HCT 40.8 36.1*  MCV 91.7 94.5  PLT 102* 79*   Cardiac Enzymes: No results for input(s): CKTOTAL, CKMB, CKMBINDEX, TROPONINI in the last 168 hours. BNP (last 3 results) No results for input(s): PROBNP in the last 8760 hours. CBG: No results for input(s): GLUCAP in the last 168 hours. D-Dimer: Recent Labs    12/26/18 1410  DDIMER 0.59*   Hgb A1c: No results for input(s): HGBA1C in the last 72 hours. Lipid Profile: No results for input(s): CHOL, HDL, LDLCALC, TRIG, CHOLHDL, LDLDIRECT in the last 72 hours. Thyroid function studies: No results for input(s): TSH, T4TOTAL, T3FREE, THYROIDAB in the last 72 hours.  Invalid input(s): FREET3 Anemia work up: No results for input(s): VITAMINB12, FOLATE, FERRITIN, TIBC, IRON, RETICCTPCT in the last 72 hours. Sepsis Labs: Recent Labs  Lab 12/26/18 0808 12/26/18 2048  WBC 4.9 5.8    Microbiology Recent Results  (from the past 240 hour(s))  SARS CORONAVIRUS 2 (TAT 6-24 HRS) Nasopharyngeal Nasopharyngeal Swab     Status: None   Collection Time: 12/26/18  3:23 PM   Specimen: Nasopharyngeal Swab  Result Value Ref Range Status   SARS Coronavirus 2 NEGATIVE NEGATIVE Final    Comment: (NOTE) SARS-CoV-2 target nucleic acids are NOT DETECTED. The SARS-CoV-2 RNA is generally detectable in upper and lower respiratory specimens during the acute phase of infection. Negative results do not preclude SARS-CoV-2 infection, do not rule out co-infections with other pathogens, and should not be used as the sole basis for treatment or other patient management decisions. Negative results must be combined with clinical observations, patient history, and epidemiological information.  The expected result is Negative. Fact Sheet for Patients: SugarRoll.be Fact Sheet for Healthcare Providers: https://www.woods-mathews.com/ This test is not yet approved or cleared by the [place] FDA and  has been authorized for detection and/or diagnosis of SARS-CoV-2 by FDA under an Emergency Use Authorization (EUA). This EUA will remain  in effect (meaning this test can be used) for the duration of the COVID-19 declaration under Section 56 4(b)(1) of the Act, 21 U.S.C. section 360bbb-3(b)(1), unless the authorization is terminated or revoked sooner. Performed at [facility], [place] [address], [place], [place] [address]     Procedures and diagnostic studies:  CT Angio Chest PE W and/or Wo Contrast  Result Date: 12/26/2018 CLINICAL DATA:  38 year old male with shortness of breath. EXAM: CT ANGIOGRAPHY CHEST WITH CONTRAST TECHNIQUE: Multidetector CT imaging of the chest was performed using the standard protocol during bolus administration of intravenous contrast. Multiplanar CT image reconstructions and MIPs were obtained to evaluate the vascular anatomy. CONTRAST:  180mL OMNIPAQUE  IOHEXOL 350 MG/ML SOLN COMPARISON:  Chest radiograph dated 12/26/2018. FINDINGS: Evaluation of this exam is limited due to respiratory motion artifact. Cardiovascular: There is no cardiomegaly or pericardial effusion. The thoracic aorta is unremarkable. The origins of the great vessels of the aortic arch appear patent as visualized. Evaluation of the pulmonary arteries is very limited, almost nondiagnostic due to suboptimal opacification and timing of the contrast as well as secondary to respiratory motion artifact. Repeat CT or V/Q scan may provide better evaluation if there is high clinical concern for acute PE. No obvious large central pulmonary artery embolus identified. Mediastinum/Nodes: No hilar or mediastinal adenopathy. The esophagus and the thyroid gland are grossly unremarkable. No mediastinal fluid collection. Lungs/Pleura: There is shallow inspiration with bibasilar linear atelectasis. Developing infiltrate at the right lung base is less likely but not excluded. Clinical correlation is recommended. No pleural effusion, or pneumothorax. The central airways are patent. Upper Abdomen: Diffuse fatty infiltration of the liver. The visualized upper abdomen is otherwise unremarkable. Musculoskeletal: No chest wall abnormality. No acute or significant osseous findings. Review of the MIP images confirms the above findings. IMPRESSION: 1. Very limited study due to respiratory motion artifact and timing of the contrast as well as respiratory motion artifact. No obvious large central pulmonary artery embolus identified. Repeat CT or V/Q scan may provide better evaluation if there is high clinical concern for acute PE. 2. Shallow inspiration with bibasilar linear atelectasis. Developing infiltrate at the right lung base is less likely but not excluded. Clinical correlation is recommended. 3. Fatty liver. Electronically Signed   By: Anner Crete M.D.   On: 12/26/2018 16:33   VAS [place] LOWER EXTREMITY VENOUS  (DVT)  Result Date: 12/28/2018  Lower Venous Study Indications: Pain.  Risk Factors: None identified. Comparison Study: No prior studies. Performing Technologist: Oliver Hum RVT  Examination Guidelines: A complete evaluation includes B-mode imaging, spectral Doppler, color Doppler, and power Doppler as needed of all accessible portions of each vessel. Bilateral testing is considered an integral part of a complete examination. Limited examinations for reoccurring indications may be performed as noted.  +---------+---------------+---------+-----------+----------+--------------+ RIGHT    CompressibilityPhasicitySpontaneityPropertiesThrombus Aging +---------+---------------+---------+-----------+----------+--------------+ CFV      Full           Yes      Yes                                 +---------+---------------+---------+-----------+----------+--------------+ SFJ  Full                                                        +---------+---------------+---------+-----------+----------+--------------+ FV Prox  Full                                                        +---------+---------------+---------+-----------+----------+--------------+ FV Mid   Full                                                        +---------+---------------+---------+-----------+----------+--------------+ FV DistalFull                                                        +---------+---------------+---------+-----------+----------+--------------+ PFV      Full                                                        +---------+---------------+---------+-----------+----------+--------------+ POP      Full           Yes      Yes                                 +---------+---------------+---------+-----------+----------+--------------+ PTV      Full                                                         +---------+---------------+---------+-----------+----------+--------------+ PERO     Full                                                        +---------+---------------+---------+-----------+----------+--------------+   +----+---------------+---------+-----------+----------+--------------+ LEFTCompressibilityPhasicitySpontaneityPropertiesThrombus Aging +----+---------------+---------+-----------+----------+--------------+ CFV Full           Yes      Yes                                 +----+---------------+---------+-----------+----------+--------------+     Summary: Right: There is no evidence of deep vein thrombosis in the lower extremity. No cystic structure found in the popliteal fossa. Left: No evidence of common femoral vein obstruction.  *See table(s) above for measurements and observations.    Preliminary     Medications:   . folic acid  1 mg Oral  Daily  . LORazepam  0-4 mg Intravenous Q4H   Followed by  . LORazepam  0-4 mg Intravenous Q8H  . multivitamin with minerals  1 tablet Oral Daily  . potassium chloride  40 mEq Oral Once  . thiamine  100 mg Oral Daily   Or  . thiamine  100 mg Intravenous Daily   Continuous Infusions: . sodium chloride 75 mL/hr at 12/28/18 1105     LOS: 1 day   [place]  [facility]   *Please refer to [email or web address], password [email or web address] to get updated schedule on who will round on this patient, as hospitalists switch teams weekly. If 7PM-7AM, please contact night-coverage at [email or web address], password [email or web address] for any overnight needs.  12/28/2018, 2:13 PM

## 2018-12-28 NOTE — Evaluation (Signed)
Physical Therapy Evaluation Patient Details Name: Chad Morgan MRN: [id number] DOB: 1980/03/19 Today's Date: 12/28/2018   History of Present Illness  38 y.o. male with medical history significant of alcohol abuse, anxiety, depression, PTSD, seizure disorder who is coming to the emergency department due to progressively worse pain in numbness on the feet for the past few days which is impairing his ability to walk.  He has been drinking about half a gallon of hard liquor daily  Clinical Impression  Pt admitted with above diagnosis.  Pt unsteady however declines use of device. amb ~ 160' with min to min-guard assist. Will continue to follow in acute setting.   Pt currently with functional limitations due to the deficits listed below (see PT Problem List). Pt will benefit from skilled PT to increase their independence and safety with mobility to allow discharge to the venue listed below.       Follow Up Recommendations Home health PT(vs no f/u depending on progress)    Equipment Recommendations  None recommended by PT    Recommendations for Other Services       Precautions / Restrictions Precautions Precautions: Fall Restrictions Weight Bearing Restrictions: No      Mobility  Bed Mobility Overal bed mobility: Needs Assistance Bed Mobility: Supine to Sit;Sit to Supine     Supine to sit: Supervision Sit to supine: Supervision   General bed mobility comments: for safety  Transfers Overall transfer level: Needs assistance Equipment used: None Transfers: Sit to/from Stand Sit to Stand: Min guard         General transfer comment: for safety, incr time, unsteady on initial standing  Ambulation/Gait Ambulation/Gait assistance: Min assist;Min guard Gait Distance (Feet): 160 Feet Assistive device: None Gait Pattern/deviations: Step-through pattern;Decreased stride length;Narrow base of support     General Gait Details: pt declined use of RW or IV pole push despite  unsteady gait, no overt LOB noted, gait stability improved with incr distance. pt reported fatigue after distance above  Stairs            Wheelchair Mobility    Modified Rankin (Stroke Patients Only)       Balance Overall balance assessment: Needs assistance   Sitting balance-Leahy Scale: Good       Standing balance-Leahy Scale: Fair(one recent fall on stairs, unrelated to drinking per pt)                               Pertinent Vitals/Pain Pain Assessment: Faces Faces Pain Scale: Hurts a little bit Pain Location: LEs Pain Descriptors / Indicators: Discomfort Pain Intervention(s): Monitored during session    Home Living Family/patient expects to be discharged to:: Private residence   Available Help at Discharge: Family Type of Home: House       Home Layout: Two level Home Equipment: Environmental consultant - 2 wheels Additional Comments: pt states his kids live upstairs and he does not go up to second level    Prior Function Level of Independence: Independent               Hand Dominance        Extremity/Trunk Assessment   Upper Extremity Assessment Upper Extremity Assessment: Overall WFL for tasks assessed    Lower Extremity Assessment Lower Extremity Assessment: Generalized weakness;RLE deficits/detail;LLE deficits/detail RLE Coordination: decreased gross motor LLE Coordination: decreased gross motor    Cervical / Trunk Assessment Cervical / Trunk Assessment: Normal  Communication   Communication:  No difficulties  Cognition Arousal/Alertness: Awake/alert Behavior During Therapy: WFL for tasks assessed/performed Overall Cognitive Status: Within Functional Limits for tasks assessed                                        General Comments      Exercises     Assessment/Plan    PT Assessment Patient needs continued PT services  PT Problem List Decreased coordination;Decreased activity tolerance;Decreased  balance;Decreased knowledge of use of DME       PT Treatment Interventions DME instruction;Gait training;Therapeutic exercise;Functional mobility training;Therapeutic activities;Patient/family education;Balance training    PT Goals (Current goals can be found in the Care Plan section)  Acute Rehab PT Goals PT Goal Formulation: With patient Time For Goal Achievement: 01/11/19 Potential to Achieve Goals: Good    Frequency Min 3X/week   Barriers to discharge        Co-evaluation               AM-PAC PT "6 Clicks" Mobility  Outcome Measure Help needed turning from your back to your side while in a flat bed without using bedrails?: None Help needed moving from lying on your back to sitting on the side of a flat bed without using bedrails?: None Help needed moving to and from a bed to a chair (including a wheelchair)?: A Little Help needed standing up from a chair using your arms (e.g., wheelchair or bedside chair)?: A Little Help needed to walk in hospital room?: A Little Help needed climbing 3-5 steps with a railing? : A Little 6 Click Score: 20    End of Session Equipment Utilized During Treatment: Gait belt Activity Tolerance: Patient tolerated treatment well Patient left: in bed;with call bell/phone within reach;with bed alarm set   PT Visit Diagnosis: Unsteadiness on feet (R26.81)    Time: [id number] PT Time Calculation (min) (ACUTE ONLY): 13 min   Charges:   PT Evaluation $PT Eval Low Complexity: [place], PT   Acute Rehab Dept [facility]): [id number]   12/28/2018   [facility] 12/28/2018, 3:12 PM

## 2018-12-29 LAB — HEPATIC FUNCTION PANEL
ALT: 259 U/L — ABNORMAL HIGH (ref 0–44)
AST: 299 U/L — ABNORMAL HIGH (ref 15–41)
Albumin: 4.2 g/dL (ref 3.5–5.0)
Alkaline Phosphatase: 45 U/L (ref 38–126)
Bilirubin, Direct: 0.2 mg/dL (ref 0.0–0.2)
Indirect Bilirubin: 0.7 mg/dL (ref 0.3–0.9)
Total Bilirubin: 0.9 mg/dL (ref 0.3–1.2)
Total Protein: 7.1 g/dL (ref 6.5–8.1)

## 2018-12-29 LAB — BASIC METABOLIC PANEL
Anion gap: 9 (ref 5–15)
BUN: 8 mg/dL (ref 6–20)
CO2: 28 mmol/L (ref 22–32)
Calcium: 9.4 mg/dL (ref 8.9–10.3)
Chloride: 100 mmol/L (ref 98–111)
Creatinine, Ser: 0.56 mg/dL — ABNORMAL LOW (ref 0.61–1.24)
GFR calc Af Amer: >60 mL/min (ref >60)
GFR calc non Af Amer: >60 mL/min (ref >60)
Glucose, Bld: 103 mg/dL — ABNORMAL HIGH (ref 70–99)
Potassium: 3.8 mmol/L (ref 3.5–5.1)
Sodium: 137 mmol/L (ref 135–145)

## 2018-12-29 MED ORDER — NICOTINE 21 MG/24HR TD PT24: 21.0000 mg | MEDICATED_PATCH | Freq: Every day | TRANSDERMAL | 0 refills | Status: DC | PRN

## 2018-12-29 MED ORDER — ADULT MULTIVITAMIN W/MINERALS CH: 1.0000 | ORAL_TABLET | Freq: Every day | ORAL | 0 refills | Status: DC

## 2018-12-29 MED ORDER — FOLIC ACID 1 MG PO TABS: 1.0000 mg | ORAL_TABLET | Freq: Every day | ORAL | 0 refills | Status: DC

## 2018-12-29 MED ORDER — THIAMINE HCL 100 MG PO TABS: 100.0000 mg | ORAL_TABLET | Freq: Every day | ORAL | 0 refills | Status: DC

## 2018-12-29 MED ORDER — PANTOPRAZOLE SODIUM 40 MG PO TBEC: 40.0000 mg | DELAYED_RELEASE_TABLET | Freq: Every day | ORAL | 0 refills | Status: DC

## 2018-12-29 MED ORDER — AMLODIPINE BESYLATE 5 MG PO TABS: 5.0000 mg | ORAL_TABLET | Freq: Every day | ORAL | 0 refills | Status: DC

## 2018-12-29 MED ORDER — CHLORDIAZEPOXIDE HCL 25 MG PO CAPS: ORAL_CAPSULE | ORAL | 0 refills | Status: DC

## 2018-12-29 MED ORDER — GABAPENTIN 300 MG PO CAPS: 300.0000 mg | ORAL_CAPSULE | Freq: Three times a day (TID) | ORAL | 0 refills | Status: DC

## 2018-12-29 NOTE — Discharge Instructions (Signed)
Alcohol Abuse and Dependence Information, Adult Alcohol is a widely available drug. People drink alcohol in different amounts. People who drink alcohol very often and in large amounts often have problems during and after drinking. They may develop what is called an alcohol use disorder. There are two main types of alcohol use disorders:  Alcohol abuse. This is when you use alcohol too much or too often. You may use alcohol to make yourself feel happy or to reduce stress. You may have a hard time setting a limit on the amount you drink.  Alcohol dependence. This is when you use alcohol consistently for a period of time, and your body changes as a result. This can make it hard to stop drinking because you may start to feel sick or feel different when you do not use alcohol. These symptoms are known as withdrawal. How can alcohol abuse and dependence affect me? Alcohol abuse and dependence can have a negative effect on your life. Drinking too much can lead to addiction. You may feel like you need alcohol to function normally. You may drink alcohol before work in the morning, during the day, or as soon as you get home from work in the evening. These actions can result in:  Poor work performance.  Job loss.  Financial problems.  Car crashes or criminal charges from driving after drinking alcohol.  Problems in your relationships with friends and family.  Losing the trust and respect of coworkers, friends, and family. Drinking heavily over a long period of time can permanently damage your body and brain, and can cause lifelong health issues, such as:  Damage to your liver or pancreas.  Heart problems, high blood pressure, or stroke.  Certain cancers.  Decreased ability to fight infections.  Brain or nerve damage.  Depression.  Early (premature) death. If you are careless or you crave alcohol, it is easy to drink more than your body can handle (overdose). Alcohol overdose is a serious  situation that requires hospitalization. It may lead to permanent injuries or death. What can increase my risk?  Having a family history of alcohol abuse.  Having depression or other mental health conditions.  Beginning to drink at an early age.  Binge drinking often.  Experiencing trauma, stress, and an unstable home life during childhood.  Spending time with people who drink often. What actions can I take to prevent or manage alcohol abuse and dependence?  Do not drink alcohol if: ? Your health care provider tells you not to drink. ? You are pregnant, may be pregnant, or are planning to become pregnant.  If you drink alcohol: ? Limit how much you use to:  0-1 drink a day for women.  0-2 drinks a day for men. ? Be aware of how much alcohol is in your drink. In the [place] one drink equals one 12 oz bottle of beer (355 mL), one 5 oz glass of wine (148 mL), or one 1 oz glass of hard liquor (44 mL).  Stop drinking if you have been drinking too much. This can be very hard to do if you are used to abusing alcohol. If you begin to have withdrawal symptoms, talk with your health care provider or a person that you trust. These symptoms may include anxiety, shaky hands, headache, nausea, sweating, or not being able to sleep.  Choose to drink nonalcoholic beverages in social gatherings and places where there may be alcohol. Activity  Spend more time on activities that you enjoy that do   not involve alcohol, like hobbies or exercise.  Find healthy ways to cope with stress, such as exercise, meditation, or spending time with people you care about. General information  Talk to your family, coworkers, and friends about supporting you in your efforts to stop drinking. If they drink, ask them not to drink around you. Spend more time with people who do not drink alcohol.  If you think that you have an alcohol dependency problem: ? Tell friends or family about your concerns. ? Talk with your  health care provider or another health professional about where to get help. ? Work with a Transport planner and a Regulatory affairs officer. ? Consider joining a support group for people who struggle with alcohol abuse and dependence. Where to find support   Your health care provider.  SMART Recovery: www.smartrecovery.org Therapy and support groups  Local treatment centers or chemical dependency counselors.  Local AA groups in your community: NicTax.com.pt Where to find more information  Centers for Disease Control and Prevention: http://www.wolf.info/  National Institute on Alcohol Abuse and Alcoholism: http://www.bradshaw.com/  Alcoholics Anonymous (AA): NicTax.com.pt Contact a health care provider if:  You drank more or for longer than you intended on more than one occasion.  You tried to stop drinking or to cut back on how much you drink, but you were not able to.  You often drink to the point of vomiting or passing out.  You want to drink so badly that you cannot think about anything else.  You have problems in your life due to drinking, but you continue to drink.  You keep drinking even though you feel anxious, depressed, or have experienced memory loss.  You have stopped doing the things you used to enjoy in order to drink.  You have to drink more than you used to in order to get the effect you want.  You experience anxiety, sweating, nausea, shakiness, and trouble sleeping when you try to stop drinking. Get help right away if:  You have thoughts about hurting yourself or others.  You have serious withdrawal symptoms, including: ? Confusion. ? Racing heart. ? High blood pressure. ? Fever. If you ever feel like you may hurt yourself or others, or have thoughts about taking your own life, get help right away. You can go to your nearest emergency department or call:  Your local emergency services (911 in the [place]  A suicide crisis helpline, such as the [facility] at 306-460-1276. This is open 24 hours a day. Summary  Alcohol abuse and dependence can have a negative effect on your life. Drinking too much or too often can lead to addiction.  If you drink alcohol, limit how much you use.  If you are having trouble keeping your drinking under control, find ways to change your behavior. Hobbies, calming activities, exercise, or support groups can help.  If you feel you need help with changing your drinking habits, talk with your health care provider, a good friend, or a therapist, or go to an [place] group. This information is not intended to replace advice given to you by your health care provider. Make sure you discuss any questions you have with your health care provider. Document Released: 12/17/2015 Document Revised: 04/12/2018 Document Reviewed: 03/01/2018 Elsevier Patient Education  [place].   Alcohol Use Disorder Alcohol use disorder is when your drinking disrupts your daily life. When you have this condition, you drink too much alcohol and you cannot control your drinking. Alcohol use  disorder can cause serious problems with your physical health. It can affect your brain, heart, liver, pancreas, immune system, stomach, and intestines. Alcohol use disorder can increase your risk for certain cancers and cause problems with your mental health, such as depression, anxiety, psychosis, delirium, and dementia. People with this disorder risk hurting themselves and others. What are the causes? This condition is caused by drinking too much alcohol over time. It is not caused by drinking too much alcohol only one or two times. Some people with this condition drink alcohol to cope with or escape from negative life events. Others drink to relieve pain or symptoms of mental illness. What increases the risk? You are more likely to develop this condition if:  You have a family history of alcohol use disorder.  Your culture encourages drinking to  the point of intoxication, or makes alcohol easy to get.  You had a mood or conduct disorder in childhood.  You have been a victim of abuse.  You are an adolescent and: ? You have poor grades or difficulties in school. ? Your caregivers do not talk to you about saying no to alcohol, or supervise your activities. ? You are impulsive or you have trouble with self-control. What are the signs or symptoms? Symptoms of this condition include:  Drinkingmore than you want to.  Drinking for longer than you want to.  Trying several times to drink less or to control your drinking.  Spending a lot of time getting alcohol, drinking, or recovering from drinking.  Craving alcohol.  Having problems at work, at school, or at home due to drinking.  Having problems in relationships due to drinking.  Drinking when it is dangerous to drink, such as before driving a car.  Continuing to drink even though you know you might have a physical or mental problem related to drinking.  Needing more and more alcohol to get the same effect you want from the alcohol (building up tolerance).  Having symptoms of withdrawal when you stop drinking. Symptoms of withdrawal include: ? Fatigue. ? Nightmares. ? Trouble sleeping. ? Depression. ? Anxiety. ? Fever. ? Seizures. ? Severe confusion. ? Feeling or seeing things that are not there (hallucinations). ? Tremors. ? Rapid heart rate. ? Rapid breathing. ? High blood pressure.  Drinking to avoid symptoms of withdrawal. How is this diagnosed? This condition is diagnosed with an assessment. Your health care provider may start the assessment by asking three or four questions about your drinking. Your health care provider may perform a physical exam or do lab tests to see if you have physical problems resulting from alcohol use. She or he may refer you to a mental health professional for evaluation. How is this treated? Some people with alcohol use disorder  are able to reduce their alcohol use to low-risk levels. Others need to completely quit drinking alcohol. When necessary, mental health professionals with specialized training in substance use treatment can help. Your health care provider can help you decide how severe your alcohol use disorder is and what type of treatment you need. The following forms of treatment are available:  Detoxification. Detoxification involves quitting drinking and using prescription medicines within the first week to help lessen withdrawal symptoms. This treatment is important for people who have had withdrawal symptoms before and for heavy drinkers who are likely to have withdrawal symptoms. Alcohol withdrawal can be dangerous, and in severe cases, it can cause death. Detoxification may be provided in a home, community, or primary care  setting, or in a hospital or substance use treatment facility.  Counseling. This treatment is also called talk therapy. It is provided by substance use treatment counselors. A counselor can address the reasons you use alcohol and suggest ways to keep you from drinking again or to prevent problem drinking. The goals of talk therapy are to: ? Find healthy activities and ways for you to cope with stress. ? Identify and avoid the things that trigger your alcohol use. ? Help you learn how to handle cravings.  Medicines.Medicines can help treat alcohol use disorder by: ? Decreasing alcohol cravings. ? Decreasing the positive feeling you have when you drink alcohol. ? Causing an uncomfortable physical reaction when you drink alcohol (aversion therapy).  Support groups. Support groups are led by people who have quit drinking. They provide emotional support, advice, and guidance. These forms of treatment are often combined. Some people with this condition benefit from a combination of treatments provided by specialized substance use treatment centers. Follow these instructions at home:  Take  over-the-counter and prescription medicines only as told by your health care provider.  Check with your health care provider before starting any new medicines.  Ask friends and family members not to offer you alcohol.  Avoid situations where alcohol is served, including gatherings where others are drinking alcohol.  Create a plan for what to do when you are tempted to use alcohol.  Find hobbies or activities that you enjoy that do not include alcohol.  Keep all follow-up visits as told by your health care provider. This is important. How is this prevented?  If you drink, limit alcohol intake to no more than 1 drink a day for nonpregnant women and 2 drinks a day for men. One drink equals 12 oz of beer, 5 oz of wine, or 1 oz of hard liquor.  If you have a mental health condition, get treatment and support.  Do not give alcohol to adolescents.  If you are an adolescent: ? Do not drink alcohol. ? Do not be afraid to say no if someone offers you alcohol. Speak up about why you do not want to drink. You can be a positive role model for your friends and set a good example for those around you by not drinking alcohol. ? If your friends drink, spend time with others who do not drink alcohol. Make new friends who do not use alcohol. ? Find healthy ways to manage stress and emotions, such as meditation or deep breathing, exercise, spending time in nature, listening to music, or talking with a trusted friend or family member. Contact a health care provider if:  You are not able to take your medicines as told.  Your symptoms get worse.  You return to drinking alcohol (relapse) and your symptoms get worse. Get help right away if:  You have thoughts about hurting yourself or others. If you ever feel like you may hurt yourself or others, or have thoughts about taking your own life, get help right away. You can go to your nearest emergency department or call:  Your local emergency services (911  in the [place]  A suicide crisis helpline, such as the [facility] at 8301528629. This is open 24 hours a day. Summary  Alcohol use disorder is when your drinking disrupts your daily life. When you have this condition, you drink too much alcohol and you cannot control your drinking.  Treatment may include detoxification, counseling, medicine, and support groups.  Ask friends and  family members not to offer you alcohol. Avoid situations where alcohol is served.  Get help right away if you have thoughts about hurting yourself or others. This information is not intended to replace advice given to you by your health care provider. Make sure you discuss any questions you have with your health care provider. Document Released: 01/30/2004 Document Revised: 12/04/2016 Document Reviewed: 09/19/2015 Elsevier Patient Education  [place].   Substance Use Disorder Substance use disorder occurs when a person's repeated use of drugs or alcohol interferes with his or her ability to be productive. This disorder can cause problems with mental and physical health. It can affect your ability to have healthy relationships, and it can keep you from being able to meet your responsibilities at work, home, or school. It can also lead to addiction, which is a condition in which the person cannot stop using the substance consistently for a period of time. Addiction changes the way the brain works. Because of these changes, addiction is a chronic condition. Substance use disorder can be mild, moderate, or severe. The most commonly abused substances include:  Alcohol.  Tobacco.  Marijuana.  Stimulants, such as cocaine and methamphetamine.  Hallucinogens, such as LSD and PCP.  Opioids, such as some prescription pain medicines and heroin. What are the causes? This condition may develop due to many complex social, psychological, or physical reasons, such  as:  Stress.  Abuse.  Peer pressure.  Anxiety or depression. What increases the risk? This condition is more likely to develop in people who:  Use substances to cope with stress.  Have been abused.  Have a mental health disorder, such as depression.  Have a family history of substance use disorder. What are the signs or symptoms? Symptoms of this condition include:  Using the substance for longer periods of time or at a higher dosage than what is normal or intended.  Having a lasting desire to use the substance.  Being unable to slow down or stop the use of the substance.  Spending an abnormal amount of time getting the substance, using the substance, or recovering from using the substance.  Using the substance in a way that interferes with work, school, social activities, and personal relationships.  Using the substance even after having negative consequences, such as: ? Health problems. ? Legal or financial troubles. ? Job loss. ? Relationship problems.  Needing more and more of the substance to get the same effect (developing tolerance).  Experiencing unpleasant symptoms if you do not use the substance (withdrawal).  Using the substance to avoid withdrawal symptoms. How is this diagnosed? This condition may be diagnosed based on:  A physical exam.  Your history of substance use.  Your symptoms. This includes: ? How substance use affects your life. ? Changes in personality, behaviors, and mood. ? Having at least two symptoms of substance use disorder within a 43-month period. ? Health issues related to substance use, such as liver damage, shortness of breath, fatigue, cough, or heart problems.  Blood or urine tests to screen for alcohol and drugs. How is this treated? This condition may be treated by:  Stopping substance use safely. This may require taking medicines and being closely monitored for several days.  Taking part in group and individual  counseling from mental health providers who help people with substance use disorder.  Staying at a live-in (residential) treatment center for several days or weeks.  Attending daily counseling sessions at a treatment center.  Taking medicine  as told by your health care provider: ? To ease symptoms and prevent complications during withdrawal. ? To treat other mental health issues, such as depression or anxiety. ? To block cravings by causing the same effects as the substance. ? To block the effects of the substance or replace good sensations with unpleasant ones.  Participating in a support group to share your experience with others who are going through the same thing. These groups are an important part of long-term recovery for many people. Recovery can be a long process. Many people who undergo treatment start using the substance again after stopping (relapse). If you relapse, that does not mean that treatment will not work. Follow these instructions at home:   Take over-the-counter and prescription medicines only as told by your health care provider.  Do not use any drugs or alcohol.  Avoid temptations or triggers that you associate with your use of the substance.  Learn and practice techniques for managing stress.  Have a plan for vulnerable moments. Get phone numbers of people who are willing to help and who are committed to your recovery.  Attend support groups on a regular basis. These groups include 12-step programs like Alcoholics Anonymous and Narcotics Anonymous.  Keep all follow-up visits as told by your health care providers. This is important. This includes continuing to work with therapists and support groups. Contact a health care provider if:  You cannot take your medicines as told.  Your symptoms get worse.  You have trouble resisting the urge to use drugs or alcohol. Get help right away if you:  Relapse.  Think that you may have taken too much of a drug.  The hotline of the [facility] is [phone].  Have signs of an overdose. Symptoms include: ? Chest pain. ? Confusion. ? Sleepiness or difficulty staying awake. ? Slowed breathing. ? Nausea or vomiting. ? A seizure.  Have serious thoughts about hurting yourself or someone else. Drug overdose is an emergency. Do not wait to see if the symptoms will go away. Get medical help right away. Call your local emergency services (911 in the [place] Do not drive yourself to the hospital. If you ever feel like you may hurt yourself or others, or have thoughts about taking your own life, get help right away. You can go to your nearest emergency department or call:  Your local emergency services (911 in the [place]  A suicide crisis helpline, such as the [facility] at 7693262622. This is open 24 hours a day. Summary  Substance use disorder occurs when a person's repeated use of drugs or alcohol interferes with his or her ability to be productive.  Taking part in group and individual counseling from mental health providers is a common treatment for people with substance use disorder.  Recovery can be a long process. Many people who undergo treatment start using the substance again after stopping (relapse). A relapse does not mean that treatment will not work.  Attend support groups such as Alcoholics Anonymous and Narcotics Anonymous. These groups are an important part of long-term recovery for many people. This information is not intended to replace advice given to you by your health care provider. Make sure you discuss any questions you have with your health care provider. Document Released: 08/13/2004 Document Revised: 04/14/2018 Document Reviewed: 02/02/2017 Elsevier Patient Education  2020 [place].  Liver Function Tests Why am I having this test? Liver function tests are done to see how well  your liver is working. The proteins and enzymes  measured in the test can alert your health care provider to inflammation, damage, or disease in your liver. It is common to have liver function tests: When you are taking certain medicines. If you have liver disease. If you drink a lot of alcohol. When you are not feeling well. When you have other conditions that may affect your liver. During annual physical exams. If you have symptoms such as yellowing of the skin (jaundice), abdominal pain, or nausea and vomiting. What is being tested? These tests measure various substances in your blood. This may include: Alanine transaminase (ALT). This is an enzyme in the liver. Aspartate transaminase (AST). This is an enzyme in the liver, heart, and muscles. Alkaline phosphatase (ALP). This is a protein in the liver, bile ducts, bone, and other body tissues. Total bilirubin. This is a yellow pigment in bile. Albumin. This is a protein in the liver. Prothrombin time and international normalized ratio (PT and INR). PT measures the time it takes for your blood to clot. INR is a calculation of blood clotting time based on your PT result. It is also calculated based on normal ranges defined by the lab that processed your test. Total protein. This includes two proteins, albumin and globulin, found in the blood. What kind of sample is taken?  A blood sample is required for this test. It is usually collected by inserting a needle into a blood vessel. How do I prepare for this test? How you prepare will depend on which tests are being done and the reason for doing them. You may need to: Avoid eating for 4-6 hours before the test, or as told by your health care provider. Stop taking certain medicines before your blood test, as told by your health care provider. Tell a health care provider about: All medicines you are taking, including vitamins, herbs, eye drops, creams, and over-the-counter medicines. Any medical conditions you have. Whether you are pregnant  or may be pregnant. How are the results reported? Your test results will be reported as values. Your health care provider will compare your results to normal ranges that were established after testing a large group of people (reference ranges). Reference ranges may vary among labs and hospitals. For the substances measured in liver function tests, common reference ranges are: ALT Infant: 10-40 international units/L. Child or adult: 4-36 international units/L at 37C or 4-36 units/L (SI units). Reference ranges may be higher for older adults. AST Newborn 19-34 days old: 35-140 units/L. Child younger than 34 years old: 15-60 units/L. 57-71 years old: 15-50 units/L. 43-33 years old: 10-50 units/L. 72-64 years old: 10-40 units/L. Adult: 0-35 units/L or 0-0.58 microkatals/L (SI units). Reference ranges may be higher for older adults. ALP Child younger than 67 years old: 85-235 units/L. 96-75 years old: 65-210 units/L. 20-53 years old: 60-300 units/L. 75-17 years old: 30-200 units/L. Adult: 30-120 units/L or 0.5-2.0 microkatals/L (SI units). Reference ranges may be higher for older adults. Total bilirubin Newborn: 1.0-12.0 mg/dL or 17.1-205 micromoles/L (SI units). Child or adult: 0.3-1.0 mg/dL or 5.1-17 micromoles/L. Albumin Premature infant: 3.0-4.2 g/dL. Newborn: 3.5-5.4 g/dL. Infant: 4.4-5.4 g/dL. Child: 4.0-5.9 g/dL. Adult: 3.5-5.0 g/dL or 35-50 g/L (SI units). PT 11.0-12.5 seconds; 85%-100%. INR 0.8-1.1. Total protein Premature infant: 4.2-7.6 g/dL. Newborn: 4.6-7.4 g/dL. Infant: 6.0-6.7 g/dL. Child: 6.2-8.0 g/dL. Adult: 6.4-8.3 g/dL or 64-83 g/L (SI units). What do the results mean? Results that are within the reference ranges are considered normal. For each substance measured,  results outside the reference range can indicate various health issues. ALT Levels above the normal range may indicate liver disease. AST Levels above the normal range may indicate liver disease. Sometimes  levels also increase after burns, surgery, heart attack, muscle damage, or seizure. ALP Levels above the normal range may be seen in biliary obstruction, liver diseases, bone disease, thyroid disease, tumors, fractures, leukemia, lymphoma, or several other conditions. People with blood type O or B may show higher levels after a fatty meal. Levels below the normal range may indicate bone and teeth conditions, malnutrition, protein deficiency, or Wilson's disease. Total bilirubin Levels above the normal range may indicate problems with the liver, gallbladder, or bile ducts. Albumin Levels above the normal range may indicate dehydration. They may also be caused by a diet that is high in protein. Levels below the normal range may indicate kidney disease, liver disease, or malabsorption of nutrients. PT and INR Levels above the normal range mean that your blood is clotting slower than normal. This may be due to blood disorders, liver disorders, or low levels of vitamin K. Total protein Levels above the normal range may be due to infection or other diseases. Levels below the normal range may be due to an immune system disorder, bleeding, burns, kidney disorder, liver disease, trouble absorbing or getting nutrients, or other conditions that affect the intestines. Talk with your health care provider about what your results mean. Questions to ask your health care provider Ask your health care provider, or the department that is doing the test: When will my results be ready? How will I get my results? What are my treatment options? What other tests do I need? What are my next steps? Summary Liver function tests are done to see how well your liver is working. These tests measure various proteins and enzymes in your blood. The results can alert your health care provider to inflammation, damage, or disease in your liver. Talk with your health care provider about what your results mean. This information  is not intended to replace advice given to you by your health care provider. Make sure you discuss any questions you have with your health care provider. Document Released: 01/25/2004 Document Revised: 08/11/2017 Document Reviewed: 10/06/2016 Elsevier Patient Education  2020 [employer].

## 2018-12-29 NOTE — Progress Notes (Signed)
Physical Therapy Treatment Patient Details Name: Chad Morgan MRN: [id number] DOB: 28-Mar-1980 Today's Date: 12/29/2018    History of Present Illness 38 y.o. male with medical history significant of alcohol abuse, anxiety, depression, PTSD, seizure disorder who is coming to the emergency department due to progressively worse pain in numbness on the feet for the past few days which is impairing his ability to walk.  He has been drinking about half a gallon of hard liquor daily    PT Comments    Pt ambulated 400' without an assistive device with no overt loss of balance, though he did report maintaining his balance required, "a lot of effort".  He declined use of IV pole or RW for support. He reported 3/10 pain in B feet and has decreased sensation to light touch in B feet. Pt asked what was causing the tingling. 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 Encouraged pt to consider use of cane at home to minimize fall risk, he is agreeable.  Pt stated he is planning to attend AA.    Follow Up Recommendations  Outpatient PT(vs. no follow up depending on progress)     Equipment Recommendations  Cane    Recommendations for Other Services       Precautions / Restrictions Precautions Precautions: Fall Restrictions Weight Bearing Restrictions: No    Mobility  Bed Mobility Overal bed mobility: Modified Independent Bed Mobility: Supine to Sit;Sit to Supine     Supine to sit: Modified independent (Device/Increase time);HOB elevated Sit to supine: Modified independent (Device/Increase time);HOB elevated   General bed mobility comments: used rail  Transfers Overall transfer level: Needs assistance Equipment used: None Transfers: Sit to/from Stand Sit to Stand: Supervision         General transfer comment: for safety, no loss of balance  Ambulation/Gait Ambulation/Gait assistance: Min guard Gait Distance (Feet):  400 Feet Assistive device: None Gait Pattern/deviations: Step-through pattern;Decreased stride length;Wide base of support Gait velocity: WFL   General Gait Details: pt declined use of RW or IV pole push despite unsteady gait, no overt LOB noted, pt reports maintaining his balance takes, "a lot of effort"; encouraged pt to consider use of a cane to minimize effort of maintaining balance and to decrease fall risk, he is agreeable to being issued one for home   Stairs             Wheelchair Mobility    Modified Rankin (Stroke Patients Only)       Balance Overall balance assessment: Needs assistance   Sitting balance-Leahy Scale: Good       Standing balance-Leahy Scale: Good(one recent fall on stairs, unrelated to drinking per pt) Standing balance comment: no loss of balance with head turns while walking                            Cognition Arousal/Alertness: Awake/alert Behavior During Therapy: WFL for tasks assessed/performed Overall Cognitive Status: Within Functional Limits for tasks assessed                                        Exercises      General Comments        Pertinent Vitals/Pain Pain Score: 3  Pain Location: B feet Pain Descriptors / Indicators: Tingling Pain Intervention(s): Monitored during session  Home Living                      Prior Function            PT Goals (current goals can now be found in the care plan section) Acute Rehab PT Goals PT Goal Formulation: With patient Time For Goal Achievement: 01/11/19 Potential to Achieve Goals: Good Progress towards PT goals: Progressing toward goals    Frequency    Min 3X/week      PT Plan Discharge plan needs to be updated    Co-evaluation              AM-PAC PT "6 Clicks" Mobility   Outcome Measure  Help needed turning from your back to your side while in a flat bed without using bedrails?: None Help needed moving from lying on  your back to sitting on the side of a flat bed without using bedrails?: None Help needed moving to and from a bed to a chair (including a wheelchair)?: None Help needed standing up from a chair using your arms (e.g., wheelchair or bedside chair)?: None Help needed to walk in hospital room?: A Little Help needed climbing 3-5 steps with a railing? : A Little 6 Click Score: 22    End of Session Equipment Utilized During Treatment: Gait belt Activity Tolerance: Patient tolerated treatment well Patient left: in bed;with call bell/phone within reach;with bed alarm set Nurse Communication: Mobility status PT Visit Diagnosis: Unsteadiness on feet (R26.81)     Time: [id number] PT Time Calculation (min) (ACUTE ONLY): 19 min  Charges:  $Gait Training: 8-22 mins                    Blondell Reveal Kistler PT 12/29/2018  Acute Rehabilitation Services Pager [phone] Office [phone]

## 2018-12-29 NOTE — Progress Notes (Signed)
Discharge instructions given to pt and all questions were answered.  

## 2018-12-30 NOTE — Discharge Summary (Addendum)
Physician Discharge Summary  Patient ID: Chad Morgan MRN: [id number] DOB/AGE: [birth year] 38 y.o.  Admit date: 12/26/2018 Discharge date: 12/29/2018  Admission Diagnoses:  Discharge Diagnoses:  Principal Problem:   Alcohol withdrawal ([place]) Active Problems:   Tobacco abuse   Depression   Hypokalemia   Hyponatremia   Transaminitis   Hyperglycemia   Alcohol withdrawal syndrome ([place])   Discharged Condition: fair  Hospital Course:  Brief Hospital Course:  38 y.o.malewith medical history significant ofalcohol abuse, anxiety, depression, PTSD, seizure disorderwho is coming to the emergency department due to progressively worse pain in numbness on the feet for the past few days which is impairing his ability to walk. He has been drinking about half a gallon of hard liquor daily. He had last drank alcohol this morning of admission day. CBC shows white count of 4.9, hemoglobin 14.9 g/dL and platelets 102. Lipase 97, AST 889 ALT 161 units/L. The rest of the hepatic function tests are within normal limits. Renal function is unremarkable. Sodium is 128, potassium 2.9, chloride 84 and CO2 24 mmol/L. Glucose 147 and at the alcohol 336 mg/dL. PT and INR were negative.  CT angio negative for any PE.  Was admitted with alcohol intoxication/and withdrawal.  Alcohol withdrawal syndrome: Resolved    Pt was resting comfortably with his wife/girlfriend on bed - Minimal to none benzo given as pt request - Counseled to quit drinking alcohol.   - Consult social worker to assist with outpatient rehab resources. - Rx for librium tapered dose, FA, B1 and other vitamins given - Also gabapentin and PPI Rx given  - Pt was also provided new PCP info for him to make an immediate appt for f/u. SW arranged.   Hyponatremia: Improved.  Hypokalemia and hypomagnesemia: Resolved  -Status post replacement  Elevated liver enzymes/fatty liver: Minimally elevated  - Pt had no complaints  - this is  likely due to alcohol use disorder and chronic    -Avoid any hepatotoxic agents including Tylenol - Outpatient follow-up recommended with PCP in 1 week - SW assisted arrange new PCP set up. Pt provided info to make an appt for f/u - Pt was strongly advised to cont to abstain alcohol, tylenol an any other potential hepatotoxic agents.  - Warning S/S explained to pt when he must seek immediate medical attention. Pt expressed undersanding  Tobacco abuse: Counseled to quit smoking cigarettes. -Provide nicotine patch  Right calf pain: Likely chronic along with heel pain which he says has been there for a couple of months -DVT was ruled out by venous Doppler    Elevated blood pressure: stabilized  - Amlodipine Rx given  - Advised keep BP log and bring that to PCP for further management. Holding parameter given   Mobility: Patient states he chronically uses DME/walker to ambulate  - PT OT for eval treat. Was ambulating with PT - OP PT f/u by PCP PRN per PCP   Consults: None  Significant Diagnostic Studies: Radiology and balood work   Treatments: As per hospital course and d/c medlist   Discharge Exam: Blood pressure 131/81, pulse 72, temperature 98.4 F (36.9 C), temperature source Oral, resp. rate 18, height 6\' 2"  (1.88 m), weight 90.7 kg, SpO2 98 %.  GEN: NAD  SKIN: No rash EYES: EOMI, anicteric ENT: MMM CV: Regular rate and regular rhythm PULM: CTA BL ABD: soft, ND, NT, +BS CNS: AAO x 3, non focal EXT: No edema    Disposition: Discharge disposition: 01-Home or Self Care  Tolerated diet well, voided and had BM. Vitals remain WNL/acceptable limit  Pt was strongly advised to cont to abstain alcohol, tylenol an any other potential hepatotoxic agents.  - Warning S/S explained to pt when he must seek immediate medical attention. Pt expressed undersanding - New PCP info provided and advised to f/u in 1 week with f/u lab per PCP.  - Pt expressed understanding   Discharge Instructions    Call MD for:   Complete by: As directed    Abdominal pain, jaundice/ yellowish skin and eye color, extreme N/V or inability to maintain PO intake   Call MD for:  difficulty breathing, headache or visual disturbances   Complete by: As directed    Diet - low sodium heart healthy   Complete by: As directed    Low fat and low cholesterol diet   Increase activity slowly   Complete by: As directed      Allergies as of 12/29/2018   No Known Allergies     Medication List    TAKE these medications   amLODipine 5 MG tablet Commonly known as: NORVASC Take 1 tablet (5 mg total) by mouth daily. Check BP. Hold if SBP<110 or DBP<55   chlordiazePOXIDE 25 MG capsule Commonly known as: LIBRIUM 50mg  PO TID x 1D, then 25-50mg  PO BID X 1D, then 25-50mg  PO QD X 1D   FLUoxetine 20 MG capsule Commonly known as: PROZAC Take 1 capsule (20 mg total) by mouth daily. For mood control   folic acid 1 MG tablet Commonly known as: FOLVITE Take 1 tablet (1 mg total) by mouth daily.   gabapentin 300 MG capsule Commonly known as: NEURONTIN Take 1 capsule (300 mg total) by mouth 3 (three) times daily. For alcohol withdrawal   hydrOXYzine 50 MG tablet Commonly known as: ATARAX/VISTARIL Take 1 tablet (50 mg total) by mouth 3 (three) times daily as needed for anxiety.   multivitamin with minerals Tabs tablet Take 1 tablet by mouth daily.   naltrexone 50 MG tablet Commonly known as: DEPADE Take 0.5 tablets (25 mg total) by mouth daily. For cravings   nicotine 21 mg/24hr patch Commonly known as: NICODERM CQ - dosed in mg/24 hours Place 1 patch (21 mg total) onto the skin daily as needed (For nicotine withdrawal symptoms).   ondansetron 4 MG disintegrating tablet Commonly known as: Zofran ODT Take 1 tablet (4 mg total) by mouth every 8 (eight) hours as needed for nausea or vomiting.   pantoprazole 40 MG tablet Commonly known as: PROTONIX Take 1 tablet (40 mg total) by  mouth daily.   thiamine 100 MG tablet Take 1 tablet (100 mg total) by mouth daily.      Follow-up [facility].   Specialty: Internal Medicine Why: They were not open on the 24th when I tried to get you a hospital follow up appointment. Please call them on Friday when they reopen Contact information: [address] [place]          Signed: Thornell Mule 12/30/2018, 11:45 AM

## 2019-02-02 ENCOUNTER — Ambulatory Visit (HOSPITAL_COMMUNITY)
Admission: RE | Admit: 2019-02-02 | Discharge: 2019-02-02 | Disposition: A | Discharge status: Home / Self Care | Payer: Medicaid Other | Source: Ambulatory Visit | Attending: Nurse Practitioner | Admitting: Nurse Practitioner

## 2019-02-02 ENCOUNTER — Ambulatory Visit (INDEPENDENT_AMBULATORY_CARE_PROVIDER_SITE_OTHER): Payer: Self-pay | Admitting: Nurse Practitioner

## 2019-02-02 ENCOUNTER — Encounter: Payer: Self-pay | Admitting: Nurse Practitioner

## 2019-02-02 ENCOUNTER — Other Ambulatory Visit: Payer: Self-pay

## 2019-02-02 VITALS — BP 108/64 | HR 86 | Temp 98.5°F | Resp 16 | Ht 74.0 in | Wt 232.0 lb

## 2019-02-02 DIAGNOSIS — F10939 Alcohol use, unspecified with withdrawal, unspecified: Secondary | ICD-10-CM

## 2019-02-02 DIAGNOSIS — Z23 Encounter for immunization: Secondary | ICD-10-CM

## 2019-02-02 DIAGNOSIS — M5416 Radiculopathy, lumbar region: Secondary | ICD-10-CM

## 2019-02-02 DIAGNOSIS — Z0000 Encounter for general adult medical examination without abnormal findings: Secondary | ICD-10-CM

## 2019-02-02 DIAGNOSIS — K219 Gastro-esophageal reflux disease without esophagitis: Secondary | ICD-10-CM

## 2019-02-02 DIAGNOSIS — I10 Essential (primary) hypertension: Secondary | ICD-10-CM

## 2019-02-02 DIAGNOSIS — C719 Malignant neoplasm of brain, unspecified: Secondary | ICD-10-CM

## 2019-02-02 DIAGNOSIS — Z7289 Other problems related to lifestyle: Secondary | ICD-10-CM | POA: Insufficient documentation

## 2019-02-02 DIAGNOSIS — F332 Major depressive disorder, recurrent severe without psychotic features: Secondary | ICD-10-CM

## 2019-02-02 DIAGNOSIS — R569 Unspecified convulsions: Secondary | ICD-10-CM

## 2019-02-02 DIAGNOSIS — F1090 Alcohol use, unspecified, uncomplicated: Secondary | ICD-10-CM | POA: Insufficient documentation

## 2019-02-02 DIAGNOSIS — Z789 Other specified health status: Secondary | ICD-10-CM | POA: Insufficient documentation

## 2019-02-02 DIAGNOSIS — F10239 Alcohol dependence with withdrawal, unspecified: Secondary | ICD-10-CM

## 2019-02-02 DIAGNOSIS — D496 Neoplasm of unspecified behavior of brain: Secondary | ICD-10-CM | POA: Insufficient documentation

## 2019-02-02 LAB — POCT URINALYSIS DIPSTICK
Bilirubin, UA: NEGATIVE
Blood, UA: NEGATIVE
Glucose, UA: NEGATIVE
Ketones, UA: NEGATIVE
Leukocytes, UA: NEGATIVE
Nitrite, UA: NEGATIVE
Protein, UA: NEGATIVE
Spec Grav, UA: 1.02 (ref 1.010–1.025)
Urobilinogen, UA: 0.2 E.U./dL
pH, UA: 7 (ref 5.0–8.0)

## 2019-02-02 MED ORDER — PANTOPRAZOLE SODIUM 40 MG PO TBEC: 40.0000 mg | DELAYED_RELEASE_TABLET | Freq: Every day | ORAL | 0 refills | Status: DC

## 2019-02-02 MED ORDER — GABAPENTIN 300 MG PO CAPS: 300.0000 mg | ORAL_CAPSULE | Freq: Four times a day (QID) | ORAL | 0 refills | Status: DC

## 2019-02-02 MED ORDER — PROPRANOLOL HCL 10 MG PO TABS: 10.0000 mg | ORAL_TABLET | Freq: Three times a day (TID) | ORAL | 0 refills | Status: DC

## 2019-02-02 MED ORDER — FLUOXETINE HCL 20 MG PO CAPS: 20.0000 mg | ORAL_CAPSULE | Freq: Every day | ORAL | 0 refills | Status: DC
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
## 2019-02-02 NOTE — Patient Instructions (Addendum)
Major Depressive Disorder, Adult Major depressive disorder (MDD) is a mental health condition. MDD often makes you feel sad, hopeless, or helpless. MDD can also cause symptoms in your body. MDD can affect your:  Work.  School.  Relationships.  Other normal activities. MDD can range from mild to very bad. It may occur once (single episode MDD). It can also occur many times (recurrent MDD). The main symptoms of MDD often include:  Feeling sad, depressed, or irritable most of the time.  Loss of interest. MDD symptoms also include:  Sleeping too much or too little.  Eating too much or too little.  A change in your weight.  Feeling tired (fatigue) or having low energy.  Feeling worthless.  Feeling guilty.  Trouble making decisions.  Trouble thinking clearly.  Thoughts of suicide or harming others.  Feeling weak.  Feeling agitated.  Keeping yourself from being around other people (isolation). Follow these instructions at home: Activity  Do these things as told by your doctor: ? Go back to your normal activities. ? Exercise regularly. ? Spend time outdoors. Alcohol  Talk with your doctor about how alcohol can affect your antidepressant medicines.  Do not drink alcohol. Or, limit how much alcohol you drink. ? This means no more than 1 drink a day for nonpregnant women and 2 drinks a day for men. One drink equals one of these:  12 oz of beer.  5 oz of wine.  1 oz of hard liquor. General instructions  Take over-the-counter and prescription medicines only as told by your doctor.  Eat a healthy diet.  Get plenty of sleep.  Radicular Pain Radicular pain is a type of pain that spreads from your back or neck along a spinal nerve. Spinal nerves are nerves that leave the spinal cord and go to the muscles. Radicular pain is sometimes called radiculopathy, radiculitis, or a pinched nerve. When you have this type of pain, you may also have weakness, numbness, or  tingling in the area of your body that is supplied by the nerve. The pain may feel sharp and burning. Depending on which spinal nerve is affected, the pain may occur in the: Neck area (cervical radicular pain). You may also feel pain, numbness, weakness, or tingling in the arms. Mid-spine area (thoracic radicular pain). You would feel this pain in the back and chest. This type is rare. Lower back area (lumbar radicular pain). You would feel this pain as low back pain. You may feel pain, numbness, weakness, or tingling in the buttocks or legs. Sciatica is a type of lumbar radicular pain that shoots down the back of the leg. Radicular pain occurs when one of the spinal nerves becomes irritated or squeezed (compressed). It is often caused by something pushing on a spinal nerve, such as one of the bones of the spine (vertebrae) or one of the round cushions between vertebrae (intervertebral disks). This can result from: An injury. Wear and tear or aging of a disk. The growth of a bone spur that pushes on the nerve. Radicular pain often goes away when you follow instructions from your health care provider for relieving pain at home. Follow these instructions at home: Managing pain     If directed, put ice on the affected area: Put ice in a plastic bag. Place a towel between your skin and the bag. Leave the ice on for 20 minutes, 2-3 times a day. If directed, apply heat to the affected area as often as told by your health  care provider. Use the heat source that your health care provider recommends, such as a moist heat pack or a heating pad. Place a towel between your skin and the heat source. Leave the heat on for 20-30 minutes. Remove the heat if your skin turns bright red. This is especially important if you are unable to feel pain, heat, or cold. You may have a greater risk of getting burned. Activity  Do not sit or rest in bed for long periods of time. Try to stay as active as possible. Ask  your health care provider what type of exercise or activity is best for you. Avoid activities that make your pain worse, such as bending and lifting. Do not lift anything that is heavier than 10 lb (4.5 kg), or the limit that you are told, until your health care provider says that it is safe. Practice using proper technique when lifting items. Proper lifting technique involves bending your knees and rising up. Do strength and range-of-motion exercises only as told by your health care provider or physical therapist. General instructions Take over-the-counter and prescription medicines only as told by your health care provider. Pay attention to any changes in your symptoms. Keep all follow-up visits as told by your health care provider. This is important. Your health care provider may send you to a physical therapist to help with this pain. Contact a health care provider if: Your pain and other symptoms get worse. Your pain medicine is not helping. Your pain has not improved after a few weeks of home care. You have a fever. Get help right away if: You have severe pain, weakness, or numbness. You have difficulty with bladder or bowel control. Summary Radicular pain is a type of pain that spreads from your back or neck along a spinal nerve. When you have radicular pain, you may also have weakness, numbness, or tingling in the area of your body that is supplied by the nerve. The pain may feel sharp or burning. Radicular pain may be treated with ice, heat, medicines, or physical therapy. This information is not intended to replace advice given to you by your health care provider. Make sure you discuss any questions you have with your health care provider. Document Revised: 07/06/2017 Document Reviewed: 07/06/2017 Elsevier Patient Education  2020 [employer] activities that you enjoy. Make time to do them.  Think about joining a support group. Your doctor may be able to suggest a group  for you.  Keep all follow-up visits as told by your doctor. This is important. Where to find more information:  [employer] on Mental Illness: ? www.nami.[place]: ? https://carter.com/  National Suicide Prevention Lifeline: ? 318 829 4919. This is free, 24-hour help. Contact a doctor if:  Your symptoms get worse.  You have new symptoms. Get help right away if:  You self-harm.  You see, hear, taste, smell, or feel things that are not present (hallucinate). If you ever feel like you may hurt yourself or others, or have thoughts about taking your own life, get help right away. You can go to your nearest emergency department or call:  Your local emergency services (911 in the [place]  A suicide crisis helpline, such as the National Suicide Prevention Lifeline: ? 2283015636. This is open 24 hours a day. This information is not intended to replace advice given to you by your health care provider. Make sure you discuss any questions you have with your health care provider.  Document Revised: 12/04/2016 Document Reviewed: 09/08/2015 Elsevier Patient Education  2020 [employer].

## 2019-02-03 LAB — COMP. METABOLIC PANEL (12)
AST: 32 IU/L (ref 0–40)
Albumin/Globulin Ratio: 2.1 (ref 1.2–2.2)
Albumin: 4.9 g/dL (ref 4.0–5.0)
Alkaline Phosphatase: 38 IU/L — ABNORMAL LOW (ref 39–117)
BUN/Creatinine Ratio: 8 — ABNORMAL LOW (ref 9–20)
BUN: 8 mg/dL (ref 6–20)
Bilirubin Total: 0.4 mg/dL (ref 0.0–1.2)
Calcium: 9.9 mg/dL (ref 8.7–10.2)
Chloride: 101 mmol/L (ref 96–106)
Creatinine, Ser: 0.97 mg/dL (ref 0.76–1.27)
GFR calc Af Amer: 115 mL/min/{1.73_m2} (ref >59)
GFR calc non Af Amer: 99 mL/min/{1.73_m2} (ref >59)
Globulin, Total: 2.3 g/dL (ref 1.5–4.5)
Glucose: 102 mg/dL — ABNORMAL HIGH (ref 65–99)
Potassium: 4.8 mmol/L (ref 3.5–5.2)
Sodium: 138 mmol/L (ref 134–144)
Total Protein: 7.2 g/dL (ref 6.0–8.5)

## 2019-02-03 LAB — LIPID PANEL
Chol/HDL Ratio: 3.4 ratio (ref 0.0–5.0)
Cholesterol, Total: 178 mg/dL (ref 100–199)
HDL: 53 mg/dL (ref >39)
LDL Chol Calc (NIH): 109 mg/dL — ABNORMAL HIGH (ref 0–99)
Triglycerides: 84 mg/dL (ref 0–149)
VLDL Cholesterol Cal: 16 mg/dL (ref 5–40)

## 2019-02-03 LAB — VITAMIN B12: Vitamin B-12: 352 pg/mL (ref 232–1245)

## 2019-02-03 LAB — VITAMIN D 25 HYDROXY (VIT D DEFICIENCY, FRACTURES): Vit D, 25-Hydroxy: 21.6 ng/mL — ABNORMAL LOW (ref 30.0–100.0)

## 2019-02-03 LAB — MAGNESIUM: Magnesium: 2.1 mg/dL (ref 1.6–2.3)

## 2019-02-06 MED ORDER — PROPRANOLOL HCL 10 MG PO TABS: 10.0000 mg | ORAL_TABLET | Freq: Three times a day (TID) | ORAL | 0 refills | Status: DC

## 2019-02-08 ENCOUNTER — Encounter: Payer: Self-pay | Admitting: Nurse Practitioner

## 2019-02-08 DIAGNOSIS — E559 Vitamin D deficiency, unspecified: Secondary | ICD-10-CM | POA: Insufficient documentation

## 2019-03-02 ENCOUNTER — Telehealth: Payer: Self-pay | Admitting: Family Medicine

## 2019-03-02 NOTE — Telephone Encounter (Signed)
Pt was called and reminded of there appointment 

## 2019-03-03 ENCOUNTER — Ambulatory Visit (INDEPENDENT_AMBULATORY_CARE_PROVIDER_SITE_OTHER): Payer: Self-pay | Admitting: Nurse Practitioner

## 2019-03-03 ENCOUNTER — Encounter: Payer: Self-pay | Admitting: Nurse Practitioner

## 2019-03-03 ENCOUNTER — Other Ambulatory Visit: Payer: Self-pay

## 2019-03-03 VITALS — BP 111/70 | HR 62 | Temp 98.1°F | Resp 16 | Ht 74.0 in | Wt 228.0 lb

## 2019-03-03 DIAGNOSIS — G2581 Restless legs syndrome: Secondary | ICD-10-CM

## 2019-03-03 DIAGNOSIS — I10 Essential (primary) hypertension: Secondary | ICD-10-CM

## 2019-03-03 DIAGNOSIS — F5101 Primary insomnia: Secondary | ICD-10-CM

## 2019-03-03 DIAGNOSIS — K219 Gastro-esophageal reflux disease without esophagitis: Secondary | ICD-10-CM

## 2019-03-03 DIAGNOSIS — M5416 Radiculopathy, lumbar region: Secondary | ICD-10-CM

## 2019-03-03 MED ORDER — GABAPENTIN 300 MG PO CAPS: 300.0000 mg | ORAL_CAPSULE | Freq: Every day | ORAL | 2 refills | Status: AC

## 2019-03-03 MED ORDER — FLUOXETINE HCL 20 MG PO CAPS: 20.0000 mg | ORAL_CAPSULE | Freq: Two times a day (BID) | ORAL | 2 refills | Status: AC

## 2019-03-03 MED ORDER — PROPRANOLOL HCL 10 MG PO TABS: 10.0000 mg | ORAL_TABLET | Freq: Two times a day (BID) | ORAL | 2 refills | Status: AC

## 2019-03-03 NOTE — Patient Instructions (Signed)

## 2019-03-03 NOTE — Progress Notes (Signed)
Established Patient Office Visit  Subjective:  Patient ID: Chad Morgan, male    DOB: Aug 15, 1980  Age: 39 y.o. MRN: [id number]  CC:  Chief Complaint  Patient presents with  . Anxiety    "really high" same as last month   . Follow-up    1 month follow up for leg pain?    HPI TALLEY SCHLUND presents for follow-up. He  has a past medical history of Alcohol abuse, Anxiety, Depression, PTSD (post-traumatic stress disorder), and Seizures ([place]).  He was started on Prozac 20 mg approximately 4 weeks ago.  He admits that it has been somewhat effective however he does continue to have some anxiety.  He had requested to be on propanolol which was also effective for his anxiety in the past.  He has not noticed lot of difference with this.  He did reduce the dose to propranolol 10 mg twice daily versus 3 times daily because his heart rate did drop to 50.  He denies any weakness or fatigue.  He feels like the gabapentin has been effective in helping to manage his leg pain.  He admits that certain movements does cause the pain to increase.  He rates his pain a 6-7 out of 10.  He has noticed that he is having more leg discomfort at night.  He describes it as a vibrating feeling.  He admits that this is random.  He does get some's "swimming in his hands".  He also gets lightheaded and feels like there is waves on his body.  Again this is random.  He does have a tough time sleeping.  Denies headache, visual changes, shortness of breath, dyspnea on exertion, chest pain, nausea, vomiting or any edema.   He was not able to get in pain management. Past Medical History:  Diagnosis Date  . Alcohol abuse   . Anxiety   . Depression   . PTSD (post-traumatic stress disorder)   . Seizures ([place])    with withdrawal    Past Surgical History:  Procedure Laterality Date  . WISDOM TOOTH EXTRACTION      Family History  Problem Relation Age of Onset  . Alcoholism Father     Social History    Socioeconomic History  . Marital status: Divorced    Spouse name: Not on file  . Number of children: Not on file  . Years of education: Not on file  . Highest education level: Not on file  Occupational History  . Not on file  Tobacco Use  . Smoking status: Current Some Day Smoker    Packs/day: 0.00    Years: 16.00    Pack years: 0.00    Types: Cigarettes  . Smokeless tobacco: Never Used  Substance and Sexual Activity  . Alcohol use: Yes  . Drug use: Yes    Types: Benzodiazepines, Marijuana    Comment: Patient denies   . Sexual activity: Yes  Other Topics Concern  . Not on file  Social History Narrative  . Not on file   Social Determinants of Health   Financial Resource Strain:   . Difficulty of Paying Living Expenses: Not on file  Food Insecurity:   . Worried About Charity fundraiser in the Last Year: Not on file  . Ran Out of Food in the Last Year: Not on file  Transportation Needs:   . Lack of Transportation (Medical): Not on file  . Lack of Transportation (Non-Medical): Not on file  Physical Activity:   .  Days of Exercise per Week: Not on file  . Minutes of Exercise per Session: Not on file  Stress:   . Feeling of Stress : Not on file  Social Connections:   . Frequency of Communication with Friends and Family: Not on file  . Frequency of Social Gatherings with Friends and Family: Not on file  . Attends Religious Services: Not on file  . Active Member of Clubs or Organizations: Not on file  . Attends Archivist Meetings: Not on file  . Marital Status: Not on file  Intimate Partner Violence:   . Fear of Current or Ex-Partner: Not on file  . Emotionally Abused: Not on file  . Physically Abused: Not on file  . Sexually Abused: Not on file    Outpatient Medications Prior to Visit  Medication Sig Dispense Refill  . Multiple Vitamin (MULTIVITAMIN WITH MINERALS) TABS tablet Take 1 tablet by mouth daily. 30 tablet 0  . thiamine 100 MG tablet Take 1  tablet (100 mg total) by mouth daily. 30 tablet 0  . FLUoxetine (PROZAC) 20 MG capsule Take 1 capsule (20 mg total) by mouth daily. For mood control 30 capsule 0  . gabapentin (NEURONTIN) 300 MG capsule Take 1 capsule (300 mg total) by mouth 4 (four) times daily. For alcohol withdrawal 120 capsule 0  . propranolol (INDERAL) 10 MG tablet Take 10 mg by mouth 3 (three) times daily.    . folic acid (FOLVITE) 1 MG tablet Take 1 tablet (1 mg total) by mouth daily. (Patient not taking: Reported on 03/03/2019) 30 tablet 0  . pantoprazole (PROTONIX) 40 MG tablet Take 1 tablet (40 mg total) by mouth daily. (Patient not taking: Reported on 03/03/2019) 30 tablet 0  . chlordiazePOXIDE (LIBRIUM) 25 MG capsule 50mg  PO TID x 1D, then 25-50mg  PO BID X 1D, then 25-50mg  PO QD X 1D (Patient not taking: Reported on 02/02/2019) 10 capsule 0  . gabapentin (NEURONTIN) 300 MG capsule Take by mouth.    . hydrOXYzine (ATARAX/VISTARIL) 50 MG tablet Take 1 tablet (50 mg total) by mouth 3 (three) times daily as needed for anxiety. (Patient not taking: Reported on 12/26/2018) 30 tablet 0  . naltrexone (DEPADE) 50 MG tablet Take 0.5 tablets (25 mg total) by mouth daily. For cravings (Patient not taking: Reported on 12/26/2018) 30 tablet 0  . nicotine (NICODERM CQ - DOSED IN MG/24 HOURS) 21 mg/24hr patch Place 1 patch (21 mg total) onto the skin daily as needed (For nicotine withdrawal symptoms). (Patient not taking: Reported on 02/02/2019) 28 patch 0  . ondansetron (ZOFRAN ODT) 4 MG disintegrating tablet Take 1 tablet (4 mg total) by mouth every 8 (eight) hours as needed for nausea or vomiting. (Patient not taking: Reported on 12/26/2018) 20 tablet 0   No facility-administered medications prior to visit.    No Known Allergies  ROS Review of Systems  All other systems reviewed and are negative.     Objective:    Physical Exam  Constitutional: He is oriented to person, place, and time. He appears well-developed and  well-nourished.  HENT:  Head: Normocephalic.  Cardiovascular: Normal rate and regular rhythm.  Pulmonary/Chest: Effort normal and breath sounds normal.  Musculoskeletal:     Cervical back: Normal range of motion.  Neurological: He is alert and oriented to person, place, and time.  Skin: Skin is warm and dry.  Psychiatric: He has a normal mood and affect. His behavior is normal. Judgment and thought content normal.  BP 111/70 (BP Location: Left Arm, Patient Position: Sitting, Cuff Size: Normal)   Pulse 62   Temp 98.1 F (36.7 C) (Oral)   Resp 16   Ht 6\' 2"  (1.88 m)   Wt 228 lb (103.4 kg)   SpO2 98%   BMI 29.27 kg/m  Wt Readings from Last 3 Encounters:  03/03/19 228 lb (103.4 kg)  02/02/19 232 lb (105.2 kg)  12/26/18 200 lb (90.7 kg)     There are no preventive care reminders to display for this patient.  There are no preventive care reminders to display for this patient.  No results found for: TSH Lab Results  Component Value Date   WBC 5.8 12/26/2018   HGB 13.0 12/26/2018   HCT 36.1 (L) 12/26/2018   MCV 94.5 12/26/2018   PLT 79 (L) 12/26/2018   Lab Results  Component Value Date   NA 138 02/02/2019   K 4.8 02/02/2019   CO2 28 12/29/2018   GLUCOSE 102 (H) 02/02/2019   BUN 8 02/02/2019   CREATININE 0.97 02/02/2019   BILITOT 0.4 02/02/2019   ALKPHOS 38 (L) 02/02/2019   AST 32 02/02/2019   ALT 259 (H) 12/29/2018   PROT 7.2 02/02/2019   ALBUMIN 4.9 02/02/2019   CALCIUM 9.9 02/02/2019   ANIONGAP 9 12/29/2018   Lab Results  Component Value Date   CHOL 178 02/02/2019   Lab Results  Component Value Date   HDL 53 02/02/2019   Lab Results  Component Value Date   LDLCALC 109 (H) 02/02/2019   Lab Results  Component Value Date   TRIG 84 02/02/2019   Lab Results  Component Value Date   CHOLHDL 3.4 02/02/2019   No results found for: HGBA1C    Assessment & Plan:   Problem List Items Addressed This Visit    None    Visit Diagnoses    RLS  (restless legs syndrome)    -  Primary   We will increase gabapentin 300 mg 3 times daily with a nightly dose of 600 mg   Relevant Medications   gabapentin (NEURONTIN) 300 MG capsule   Primary insomnia       Gabapentin as previously stated   Relevant Medications   gabapentin (NEURONTIN) 300 MG capsule   Hypertension, unspecified type       Propanolol 10 mg 2 times daily    Relevant Medications   FLUoxetine (PROZAC) 20 MG capsule   gabapentin (NEURONTIN) 300 MG capsule   propranolol (INDERAL) 10 MG tablet   Lumbar back pain with radiculopathy affecting left lower extremity       Pain clinic referral for possible epidural steroid injection   Relevant Medications   FLUoxetine (PROZAC) 20 MG capsule   gabapentin (NEURONTIN) 300 MG capsule   Other Relevant Orders   Ambulatory referral to Pain Clinic   Benign hypertension       Propanolol 10 mg twice daily continue to monitor blood pressure and heart rate   Relevant Medications   propranolol (INDERAL) 10 MG tablet   Gastroesophageal reflux disease without esophagitis       Pantoprazole refilled      Meds ordered this encounter  Medications  . FLUoxetine (PROZAC) 20 MG capsule    Sig: Take 1 capsule (20 mg total) by mouth 2 (two) times daily. For mood control    Dispense:  60 capsule    Refill:  2    Order Specific Question:   Supervising Provider    Answer:  JEGEDE, OLUGBEMIGA E [id number]  . gabapentin (NEURONTIN) 300 MG capsule    Sig: Take 1 capsule (300 mg total) by mouth 5 (five) times daily. For alcohol withdrawal    Dispense:  150 capsule    Refill:  2    Order Specific Question:   Supervising Provider    Answer:   Tresa Garter [id number]  . propranolol (INDERAL) 10 MG tablet    Sig: Take 1 tablet (10 mg total) by mouth 2 (two) times daily.    Dispense:  60 tablet    Refill:  2    Order Specific Question:   Supervising Provider    Answer:   Tresa Garter [id number]    Follow-up: Return in about 2  months (around 05/01/2019).    Vevelyn Francois, NP

## 2019-03-06 ENCOUNTER — Other Ambulatory Visit: Payer: Self-pay | Admitting: Nurse Practitioner

## 2019-03-06 DIAGNOSIS — M5416 Radiculopathy, lumbar region: Secondary | ICD-10-CM

## 2019-05-04 ENCOUNTER — Ambulatory Visit: Payer: Self-pay | Admitting: Nurse Practitioner

## 2019-10-09 ENCOUNTER — Other Ambulatory Visit: Payer: Self-pay | Admitting: Nurse Practitioner

## 2019-10-09 DIAGNOSIS — I10 Essential (primary) hypertension: Secondary | ICD-10-CM

## 2019-10-09 DIAGNOSIS — M5416 Radiculopathy, lumbar region: Secondary | ICD-10-CM

## 2019-10-09 DIAGNOSIS — F5101 Primary insomnia: Secondary | ICD-10-CM

## 2019-10-09 DIAGNOSIS — G2581 Restless legs syndrome: Secondary | ICD-10-CM

## 2019-10-09 NOTE — Telephone Encounter (Signed)
No future appointment schedule.  Last seen 03-03-2019

## 2019-10-09 NOTE — Telephone Encounter (Signed)
Please try to contact patient and make him aware that medication can not be prescribed with out regular follow up. It looks like he lives in [place], [place] he has found a provider closer. No refill at this time. Thanks

## 2020-07-02 ENCOUNTER — Encounter (HOSPITAL_COMMUNITY): Payer: Self-pay

## 2020-07-02 ENCOUNTER — Emergency Department (HOSPITAL_COMMUNITY)
Admission: EM | Admit: 2020-07-02 | Discharge: 2020-07-02 | Disposition: A | Discharge status: Home / Self Care | Payer: Medicaid Other | Attending: Physician Assistant | Admitting: Physician Assistant

## 2020-07-02 ENCOUNTER — Other Ambulatory Visit: Payer: Self-pay

## 2020-07-02 DIAGNOSIS — Y908 Blood alcohol level of 240 mg/100 ml or more: Secondary | ICD-10-CM | POA: Insufficient documentation

## 2020-07-02 DIAGNOSIS — Z5321 Procedure and treatment not carried out due to patient leaving prior to being seen by health care provider: Secondary | ICD-10-CM | POA: Insufficient documentation

## 2020-07-02 DIAGNOSIS — F1010 Alcohol abuse, uncomplicated: Secondary | ICD-10-CM | POA: Insufficient documentation

## 2020-07-02 LAB — COMPREHENSIVE METABOLIC PANEL
ALT: 162 U/L — ABNORMAL HIGH (ref 0–44)
AST: 142 U/L — ABNORMAL HIGH (ref 15–41)
Albumin: 4.4 g/dL (ref 3.5–5.0)
Alkaline Phosphatase: 46 U/L (ref 38–126)
Anion gap: 15 (ref 5–15)
BUN: 9 mg/dL (ref 6–20)
CO2: 24 mmol/L (ref 22–32)
Calcium: 9.2 mg/dL (ref 8.9–10.3)
Chloride: 103 mmol/L (ref 98–111)
Creatinine, Ser: 0.81 mg/dL (ref 0.61–1.24)
GFR, Estimated: >60 mL/min (ref >60)
Glucose, Bld: 106 mg/dL — ABNORMAL HIGH (ref 70–99)
Potassium: 3.6 mmol/L (ref 3.5–5.1)
Sodium: 142 mmol/L (ref 135–145)
Total Bilirubin: 0.4 mg/dL (ref 0.3–1.2)
Total Protein: 7.2 g/dL (ref 6.5–8.1)

## 2020-07-02 LAB — CBC WITH DIFFERENTIAL/PLATELET
Abs Immature Granulocytes: 0 10*3/uL (ref 0.00–0.07)
Basophils Absolute: 0.4 10*3/uL — ABNORMAL HIGH (ref 0.0–0.1)
Basophils Relative: 3 %
Eosinophils Absolute: 0 10*3/uL (ref 0.0–0.5)
Eosinophils Relative: 0 %
HCT: 49 % (ref 39.0–52.0)
Hemoglobin: 16.6 g/dL (ref 13.0–17.0)
Lymphocytes Relative: 49 %
Lymphs Abs: 7.1 10*3/uL — ABNORMAL HIGH (ref 0.7–4.0)
MCH: 32.8 pg (ref 26.0–34.0)
MCHC: 33.9 g/dL (ref 30.0–36.0)
MCV: 96.8 fL (ref 80.0–100.0)
Monocytes Absolute: 0.7 10*3/uL (ref 0.1–1.0)
Monocytes Relative: 5 %
Neutro Abs: 6.2 10*3/uL (ref 1.7–7.7)
Neutrophils Relative %: 43 %
Platelets: 320 10*3/uL (ref 150–400)
RBC: 5.06 MIL/uL (ref 4.22–5.81)
RDW: 15 % (ref 11.5–15.5)
WBC: 14.4 10*3/uL — ABNORMAL HIGH (ref 4.0–10.5)
nRBC: 0 % (ref 0.0–0.2)
nRBC: 0 /100 WBC

## 2020-07-02 LAB — ETHANOL: Alcohol, Ethyl (B): 395 mg/dL (ref <10)

## 2020-07-02 LAB — ACETAMINOPHEN LEVEL: Acetaminophen (Tylenol), Serum: <10 ug/mL — ABNORMAL LOW (ref 10–30)

## 2020-07-02 LAB — TROPONIN I (HIGH SENSITIVITY): Troponin I (High Sensitivity): 6 ng/L (ref <18)

## 2020-07-02 NOTE — ED Notes (Signed)
Patient in and out of exam room, found wandering in EMS bay. Patient returned to room 4 then again gone and did not notify staff that he was leaving

## 2020-07-02 NOTE — ED Provider Notes (Addendum)
Emergency Medicine Provider Triage Evaluation Note  Chad Morgan , a 40 y.o. male  was evaluated in triage.  Pt complains of etoh detox. Used etoh on the way here. States he hurts all over and if we don't give him ativan he is going to leave. Denies si or hi.  Review of Systems  Positive: Hurts all over Negative: Si, hi  Physical Exam  BP (!) 158/144   Pulse (!) 136   Temp 99 F (37.2 C)   Resp 14   SpO2 96%  Gen:   Awake, no distress   Resp:  Normal effort  MSK:   Moves extremities without difficulty   Medical Decision Making  Medically screening exam initiated at 12:52 PM.  Appropriate orders placed.  Chad Morgan was informed that the remainder of the evaluation will be completed by another provider, this initial triage assessment does not replace that evaluation, and the importance of remaining in the ED until their evaluation is complete.     Rodney Booze, PA-C 07/02/20 1253    Rodney Booze, PA-C 07/02/20 1257    Blanchie Dessert, MD 07/04/20 1229

## 2020-07-02 NOTE — ED Triage Notes (Signed)
Patient here requesting detox. Patient reports etoh abuse. Patient reports frequent seizures and vaping in room on assessment. Patient alert and denies pain

## 2020-07-05 ENCOUNTER — Encounter (HOSPITAL_COMMUNITY): Payer: Self-pay | Admitting: Emergency Medicine

## 2020-07-05 ENCOUNTER — Emergency Department (HOSPITAL_COMMUNITY)
Admission: EM | Admit: 2020-07-05 | Discharge: 2020-07-06 | Disposition: A | Discharge status: Home / Self Care | Payer: Medicaid Other | Attending: Emergency Medicine | Admitting: Emergency Medicine

## 2020-07-05 DIAGNOSIS — R197 Diarrhea, unspecified: Secondary | ICD-10-CM | POA: Insufficient documentation

## 2020-07-05 DIAGNOSIS — F10920 Alcohol use, unspecified with intoxication, uncomplicated: Secondary | ICD-10-CM

## 2020-07-05 DIAGNOSIS — R1084 Generalized abdominal pain: Secondary | ICD-10-CM | POA: Insufficient documentation

## 2020-07-05 DIAGNOSIS — Y908 Blood alcohol level of 240 mg/100 ml or more: Secondary | ICD-10-CM | POA: Insufficient documentation

## 2020-07-05 DIAGNOSIS — R519 Headache, unspecified: Secondary | ICD-10-CM | POA: Insufficient documentation

## 2020-07-05 DIAGNOSIS — F17210 Nicotine dependence, cigarettes, uncomplicated: Secondary | ICD-10-CM | POA: Insufficient documentation

## 2020-07-05 DIAGNOSIS — M791 Myalgia, unspecified site: Secondary | ICD-10-CM | POA: Insufficient documentation

## 2020-07-05 DIAGNOSIS — F10129 Alcohol abuse with intoxication, unspecified: Secondary | ICD-10-CM | POA: Insufficient documentation

## 2020-07-05 DIAGNOSIS — R000 Tachycardia, unspecified: Secondary | ICD-10-CM | POA: Insufficient documentation

## 2020-07-05 LAB — COMPREHENSIVE METABOLIC PANEL
ALT: 107 U/L — ABNORMAL HIGH (ref 0–44)
AST: 95 U/L — ABNORMAL HIGH (ref 15–41)
Albumin: 4.5 g/dL (ref 3.5–5.0)
Alkaline Phosphatase: 47 U/L (ref 38–126)
Anion gap: 13 (ref 5–15)
BUN: 10 mg/dL (ref 6–20)
CO2: 24 mmol/L (ref 22–32)
Calcium: 8.8 mg/dL — ABNORMAL LOW (ref 8.9–10.3)
Chloride: 103 mmol/L (ref 98–111)
Creatinine, Ser: 0.7 mg/dL (ref 0.61–1.24)
GFR, Estimated: >60 mL/min (ref >60)
Glucose, Bld: 111 mg/dL — ABNORMAL HIGH (ref 70–99)
Potassium: 3.8 mmol/L (ref 3.5–5.1)
Sodium: 140 mmol/L (ref 135–145)
Total Bilirubin: 0.7 mg/dL (ref 0.3–1.2)
Total Protein: 7.4 g/dL (ref 6.5–8.1)

## 2020-07-05 LAB — URINALYSIS, ROUTINE W REFLEX MICROSCOPIC
Bilirubin Urine: NEGATIVE
Glucose, UA: NEGATIVE mg/dL
Hgb urine dipstick: NEGATIVE
Ketones, ur: 5 mg/dL — AB
Leukocytes,Ua: NEGATIVE
Nitrite: NEGATIVE
Protein, ur: NEGATIVE mg/dL
Specific Gravity, Urine: 1.014 (ref 1.005–1.030)
pH: 5 (ref 5.0–8.0)

## 2020-07-05 LAB — CBC WITH DIFFERENTIAL/PLATELET
Abs Immature Granulocytes: 0.03 10*3/uL (ref 0.00–0.07)
Basophils Absolute: 0.1 10*3/uL (ref 0.0–0.1)
Basophils Relative: 1 %
Eosinophils Absolute: 0.1 10*3/uL (ref 0.0–0.5)
Eosinophils Relative: 1 %
HCT: 43.2 % (ref 39.0–52.0)
Hemoglobin: 14.9 g/dL (ref 13.0–17.0)
Immature Granulocytes: 0 %
Lymphocytes Relative: 34 %
Lymphs Abs: 3.1 10*3/uL (ref 0.7–4.0)
MCH: 33.2 pg (ref 26.0–34.0)
MCHC: 34.5 g/dL (ref 30.0–36.0)
MCV: 96.2 fL (ref 80.0–100.0)
Monocytes Absolute: 0.5 10*3/uL (ref 0.1–1.0)
Monocytes Relative: 6 %
Neutro Abs: 5.2 10*3/uL (ref 1.7–7.7)
Neutrophils Relative %: 58 %
Platelets: 279 10*3/uL (ref 150–400)
RBC: 4.49 MIL/uL (ref 4.22–5.81)
RDW: 14.6 % (ref 11.5–15.5)
WBC: 9 10*3/uL (ref 4.0–10.5)
nRBC: 0 % (ref 0.0–0.2)

## 2020-07-05 LAB — LIPASE, BLOOD: Lipase: 61 U/L — ABNORMAL HIGH (ref 11–51)

## 2020-07-05 LAB — RAPID URINE DRUG SCREEN, HOSP PERFORMED
Amphetamines: NOT DETECTED
Barbiturates: NOT DETECTED
Benzodiazepines: POSITIVE — AB
Cocaine: NOT DETECTED
Opiates: NOT DETECTED
Tetrahydrocannabinol: POSITIVE — AB

## 2020-07-05 LAB — ETHANOL: Alcohol, Ethyl (B): 314 mg/dL (ref <10)

## 2020-07-05 MED ORDER — SODIUM CHLORIDE 0.9 % IV BOLUS
1000.0000 mL | Freq: Once | INTRAVENOUS | Status: AC
  Administered 2020-07-05: 1000 mL via INTRAVENOUS

## 2020-07-05 MED ORDER — LORAZEPAM 2 MG/ML IJ SOLN: 0.0000 mg | Freq: Two times a day (BID) | INTRAMUSCULAR | Status: DC

## 2020-07-05 MED ORDER — LORAZEPAM 1 MG PO TABS: 0.0000 mg | ORAL_TABLET | Freq: Four times a day (QID) | ORAL | Status: DC

## 2020-07-05 MED ORDER — LORAZEPAM 2 MG/ML IJ SOLN
1.0000 mg | Freq: Once | INTRAMUSCULAR | Status: AC
  Administered 2020-07-05: 1 mg via INTRAVENOUS
  Filled 2020-07-05: qty 1

## 2020-07-05 MED ORDER — LORAZEPAM 2 MG/ML IJ SOLN
0.0000 mg | Freq: Four times a day (QID) | INTRAMUSCULAR | Status: DC
  Administered 2020-07-05: 2 mg via INTRAVENOUS
  Filled 2020-07-05: qty 1

## 2020-07-05 MED ORDER — THIAMINE HCL 100 MG/ML IJ SOLN
100.0000 mg | Freq: Every day | INTRAMUSCULAR | Status: DC
  Administered 2020-07-06: 100 mg via INTRAVENOUS
  Filled 2020-07-05: qty 2

## 2020-07-05 MED ORDER — LORAZEPAM 1 MG PO TABS: 0.0000 mg | ORAL_TABLET | Freq: Two times a day (BID) | ORAL | Status: DC

## 2020-07-05 MED ORDER — SODIUM CHLORIDE 0.9 % IV BOLUS
500.0000 mL | Freq: Once | INTRAVENOUS | Status: AC
  Administered 2020-07-05: 500 mL via INTRAVENOUS

## 2020-07-05 MED ORDER — THIAMINE HCL 100 MG PO TABS
100.0000 mg | ORAL_TABLET | Freq: Every day | ORAL | Status: DC
  Administered 2020-07-05: 100 mg via ORAL
  Filled 2020-07-05: qty 1

## 2020-07-05 NOTE — ED Provider Notes (Signed)
Emergency Medicine Provider Triage Evaluation Note  Chad Morgan , a 40 y.o. male  was evaluated in triage.  Pt complains of diffuse body aches including chest pain, headache and abdominal pain.  Drinks half a gallon of tequila at baseline but has not drank anything since yesterday.  Concerned that he will go into withdrawals.  No seizures or DTs at this time.  Review of Systems  Positive: Myalgias Negative: Seizures  Physical Exam  BP 132/83 (BP Location: Left Arm)   Pulse (!) 120   Temp 98.3 F (36.8 C) (Oral)   Resp 20   Ht 6\' 2"  (1.88 m)   Wt 77.1 kg   SpO2 96%   BMI 21.83 kg/m  Gen:   Awake, no distress   Resp:  Normal effort  MSK:   Moves extremities without difficulty  Other:  Tachycardic  Medical Decision Making  Medically screening exam initiated at 5:17 PM.  Appropriate orders placed.  [name] was informed that the remainder of the evaluation will be completed by another provider, this initial triage assessment does not replace that evaluation, and the importance of remaining in the ED until their evaluation is complete.  Lab work ordered and will room soon   Delia Heady, Hershal Coria 07/05/20 1720    Lucrezia Starch, MD 07/06/20 [phone]

## 2020-07-05 NOTE — ED Provider Notes (Signed)
[facility] DEPT Provider Note   CSN: [id number] Arrival date & time: 07/05/20  1641     History Chief Complaint  Patient presents with   Alcohol Problem    Chad Morgan is a 40 y.o. male.  Patient presents emergency department for evaluation of alcohol withdrawal.  Patient endorses heavy use of tequila over the past month.  Prior to this he had a period of sobriety.  Patient states that he was at [facility] ED yesterday and came here because they would not help him there.  Notes there demonstrated psychiatric evaluation due to voicing SI.  Note states that he did not want inpatient treatment and was going to follow-up with [facility] and discharged.  Patient states that he has not had any alcohol since yesterday.  He states that he has had a history of seizures and hallucinations withdrawing in the past.  Family at bedside states that they have contacted [facility] today and had prescreening evaluation.  They state that they do not currently have room and they were going to check back in the morning.  Patient currently complains of generalized body pain.  No nausea or vomiting.  He endorses diarrhea and headache.  He has generalized abdominal pain.  The onset of this condition was acute. The course is worsening. Aggravating factors: none. Alleviating factors: none.        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   Seizure  ([place]) 05/16/2017   Anemia 05/16/2017   MDD (major depressive disorder), recurrent severe, 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   Alcohol dependence with inpatient treatment ([place]) 03/31/2017   MDD (major depressive disorder), severe ([place]) 03/19/2017   Reactive depression 06/06/2015   Suicidal ideation 06/05/2015   Severe episode of recurrent major depressive disorder, without psychotic features ([place])    Major depressive disorder, recurrent, severe without psychotic features ([place])    PTSD (post-traumatic stress disorder) 04/25/2014   Severe major depression without psychotic features ([place]) 04/24/2014   Tobacco use 04/16/2014   Social anxiety disorder 01/09/2014   Panic attacks 01/09/2014   GAD (generalized anxiety disorder) 01/09/2014   Alcohol dependence ([place]) 01/08/2014   Alcohol use disorder, severe, dependence ([place]) 01/08/2014    Past Surgical History:  Procedure Laterality Date   WISDOM TOOTH EXTRACTION         Family History  Problem Relation Age of Onset   Alcoholism Father     Social History   Tobacco Use   Smoking status: Some Days    Packs/day: 0.00    Years: 16.00    Pack years: 0.00    Types: Cigarettes   Smokeless tobacco: Never  Vaping Use   Vaping Use: Every day  Substance Use Topics   Alcohol use: Yes   Drug use: Yes    Types: Benzodiazepines, Marijuana    Comment: Patient denies     Home Medications Prior to Admission  medications   Medication Sig Start Date End Date Taking? Authorizing Provider  FLUoxetine (PROZAC) 20 MG capsule Take 1 capsule (20 mg total) by mouth 2 (two) times daily. For mood control 03/03/19 06/01/19  Vevelyn Francois, NP  folic acid (FOLVITE) 1 MG tablet Take 1 tablet (1 mg total) by mouth daily. Patient not taking: Reported on 03/03/2019 12/29/18   Thornell Mule, MD  gabapentin (NEURONTIN) 300 MG capsule Take 1 capsule (300 mg total) by mouth 5 (five) times daily. For alcohol withdrawal 03/03/19 06/01/19  Vevelyn Francois, NP   Multiple Vitamin (MULTIVITAMIN WITH MINERALS) TABS tablet Take 1 tablet by mouth daily. 12/29/18   Thornell Mule, MD  pantoprazole (PROTONIX) 40 MG tablet Take 1 tablet (40 mg total) by mouth daily. Patient not taking: Reported on 03/03/2019 02/02/19   Vevelyn Francois, NP  propranolol (INDERAL) 10 MG tablet Take 1 tablet (10 mg total) by mouth 2 (two) times daily. 03/03/19 06/01/19  Vevelyn Francois, NP  thiamine 100 MG tablet Take 1 tablet (100 mg total) by mouth daily. 12/29/18   Thornell Mule, MD    Allergies    Patient has no known allergies.  Review of Systems   Review of Systems  Constitutional:  Negative for fever.  HENT:  Negative for rhinorrhea and sore throat.   Eyes:  Negative for redness.  Respiratory:  Negative for cough.   Cardiovascular:  Negative for chest pain.  Gastrointestinal:  Positive for abdominal pain and diarrhea. Negative for nausea and vomiting.  Genitourinary:  Negative for dysuria and hematuria.  Musculoskeletal:  Positive for myalgias.  Skin:  Negative for rash.  Neurological:  Negative for headaches.   Physical Exam Updated Vital Signs BP 132/83 (BP Location: Left Arm)   Pulse (!) 120   Temp 98.3 F (36.8 C) (Oral)   Resp 20   Ht 6\' 2"  (1.88 m)   Wt 77.1 kg   SpO2 96%   BMI 21.83 kg/m   Physical Exam Vitals and nursing note reviewed.  Constitutional:      General: He is not in acute distress.    Appearance: He is well-developed.  HENT:     Head: Normocephalic and atraumatic.     Right Ear: External ear normal.     Left Ear: External ear normal.     Nose: No congestion.     Mouth/Throat:     Mouth: Mucous membranes are dry.  Eyes:     General:        Right eye: No discharge.        Left eye: No discharge.     Conjunctiva/sclera: Conjunctivae normal.  Cardiovascular:     Rate and Rhythm: Regular rhythm. Tachycardia present.     Heart sounds: Normal heart sounds.  Pulmonary:     Effort: Pulmonary effort is normal.     Breath sounds:  Normal breath sounds.  Abdominal:     Palpations: Abdomen is soft.     Tenderness: There is no abdominal tenderness.  Musculoskeletal:     Cervical back: Normal range of motion and neck supple.  Skin:    General: Skin is warm and dry.  Neurological:     Mental Status: He is alert.     Comments: Speech is slurred.     ED Results / Procedures / Treatments   Labs (all labs ordered are listed, but only abnormal results are displayed) Labs Reviewed  COMPREHENSIVE METABOLIC PANEL - Abnormal; Notable for the following components:  Result Value   Glucose, Bld 111 (*)    Calcium 8.8 (*)    AST 95 (*)    ALT 107 (*)    All other components within normal limits  ETHANOL - Abnormal; Notable for the following components:   Alcohol, Ethyl (B) 314 (*)    All other components within normal limits  LIPASE, BLOOD - Abnormal; Notable for the following components:   Lipase 61 (*)    All other components within normal limits  RAPID URINE DRUG SCREEN, HOSP PERFORMED - Abnormal; Notable for the following components:   Benzodiazepines POSITIVE (*)    Tetrahydrocannabinol POSITIVE (*)    All other components within normal limits  URINALYSIS, ROUTINE W REFLEX MICROSCOPIC - Abnormal; Notable for the following components:   Ketones, ur 5 (*)    All other components within normal limits  CBC WITH DIFFERENTIAL/PLATELET    ED ECG REPORT   Date: 07/05/2020  Rate: 104  Rhythm: sinus tachycardia  QRS Axis: normal  Intervals: normal  ST/T Wave abnormalities: normal  Conduction Disutrbances:none  Narrative Interpretation: similar to 6/29 except poor baseline today, difficult to tell if right axis deviation due to artifact in lead I  Old EKG Reviewed: unchanged   Radiology No results found.  Procedures Procedures   Medications Ordered in ED Medications  LORazepam (ATIVAN) injection 0-4 mg (0 mg Intravenous Hold 07/05/20 2247)    Or  LORazepam (ATIVAN) tablet 0-4 mg ( Oral See Alternative  07/05/20 2247)  LORazepam (ATIVAN) injection 0-4 mg (has no administration in time range)    Or  LORazepam (ATIVAN) tablet 0-4 mg (has no administration in time range)  thiamine tablet 100 mg (100 mg Oral Given 07/05/20 1805)    Or  thiamine (B-1) injection 100 mg ( Intravenous See Alternative 07/05/20 1805)  LORazepam (ATIVAN) injection 1 mg (1 mg Intravenous Given 07/05/20 1800)  sodium chloride 0.9 % bolus 1,000 mL (0 mLs Intravenous Stopped 07/05/20 1830)  sodium chloride 0.9 % bolus 500 mL (500 mLs Intravenous New Bag/Given 07/05/20 2310)    ED Course  I have reviewed the triage vital signs and the nursing notes.  Pertinent labs & imaging results that were available during my care of the patient were reviewed by me and considered in my medical decision making (see chart for details).  Patient seen and examined.  Lab work-up, CIWA protocol ordered.  Patient is tachycardic, awaiting EKG.  Will give IV fluids, Ativan.  Reviewed previous ED notes from outside facilities.  He has had several visits in June for alcohol intoxication.  Concern for malingering at times.  Current plan is to go to [facility] when bed is available.  Vital signs reviewed and are as follows: BP 132/83 (BP Location: Left Arm)   Pulse (!) 120   Temp 98.3 F (36.8 C) (Oral)   Resp 20   Ht 6\' 2"  (1.88 m)   Wt 77.1 kg   SpO2 96%   BMI 21.83 kg/m   Patient rechecked.  He has received additional dose of Ativan due to CIWA score.  He continues to have some slurred speech and be very sleepy.  Heart rate is fluctuating, however is closer to 100 now.  Discussed patient and reviewed labs with Dr. Laverta Baltimore.  He is not likely to be going through significant alcohol withdrawal at this point.  Alcohol was 314 several hours ago.  He does not demonstrate any signs of hallucinations and there have been no reported seizures.  He continues to  metabolize.  Current plan is to continue monitoring until he is safe for discharge.  Current plan is to be  placed at [facility].  Family member can drive him in the morning per our conversation on arrival.  RN aware. Will have patient eat/drink/ambulate when able.   Signout to oncoming team at shift change.     Clinical Course as of 07/05/20 2320  Fri Jul 05, 2020  2259 Alcohol, Ethyl (B)(!!): 314 [JL]    Clinical Course User Index [JL] Long, Wonda Olds, MD   MDM Rules/Calculators/A&P                          Pt here with alcohol intoxication. Likely not forthcoming about last EtOH use. Does not appear to be having significant withdrawal at this point; he has not seized and no signs of DT's. Plan d/c when able -- patient and family have started process for admission to [facility]. High risk for relapse.     Final Clinical Impression(s) / ED Diagnoses Final diagnoses:  Alcoholic intoxication without complication [facility])    Rx / DC Orders ED Discharge Orders     None        Carlisle Cater, Hershal Coria 07/05/20 2338    Margette Fast, MD 07/06/20 1455

## 2020-07-05 NOTE — ED Notes (Signed)
Pt ambulated in room with no assistance

## 2020-07-05 NOTE — ED Triage Notes (Signed)
Pt states that he was in recovery for alcohol addiction x 8 months but has relapsed and has been drinking a 1/2 gallon tequila everyday for a month. Last drink was yesterday and he is unsteady on his feet, shaking and 'feels terrible.' Alert and oriented. Wife at bedside.

## 2020-07-06 MED ORDER — ACETAMINOPHEN 325 MG PO TABS
650.0000 mg | ORAL_TABLET | Freq: Once | ORAL | Status: AC
  Administered 2020-07-06: 650 mg via ORAL
  Filled 2020-07-06: qty 2

## 2020-07-06 MED ORDER — CHLORDIAZEPOXIDE HCL 25 MG PO CAPS: ORAL_CAPSULE | ORAL | 0 refills | Status: DC

## 2020-07-06 MED ORDER — THIAMINE HCL 100 MG PO TABS: 100.0000 mg | ORAL_TABLET | Freq: Every day | ORAL | 0 refills | Status: DC

## 2020-07-06 MED ORDER — CHLORDIAZEPOXIDE HCL 25 MG PO CAPS
25.0000 mg | ORAL_CAPSULE | Freq: Once | ORAL | Status: AC
  Administered 2020-07-06: 25 mg via ORAL
  Filled 2020-07-06: qty 1

## 2020-07-06 NOTE — ED Provider Notes (Signed)
I assumed care of this patient.  Please see previous provider note for further details of Hx, PE.  Briefly patient is a 40 y.o. male who presented for alcohol intoxication.  Pending metabolize to freedom and reassessment.  2:33 AM Patient has metabolized and able to ambulate without complication. He is requesting assistance with alcohol cessation.  He states that he he did well with his previous Librium taper back and 2020.  The patient appears reasonably screened and/or stabilized for discharge and I doubt any other medical condition or other [facility] requiring further screening, evaluation, or treatment in the ED at this time prior to discharge. Safe for discharge with strict return precautions.  Disposition: Discharge  Condition: Good  I have discussed the results, Dx and Tx plan with the patient/family who expressed understanding and agree(s) with the plan. Discharge instructions discussed at length. The patient/family was given strict return precautions who verbalized understanding of the instructions. No further questions at time of discharge.    ED Discharge Orders          Ordered    thiamine 100 MG tablet  Daily        07/06/20 0233    chlordiazePOXIDE (LIBRIUM) 25 MG capsule        07/06/20 0233            [facility] narcotic database reviewed and no active prescriptions noted.   Follow Up: Vevelyn Francois, NP [address] [place] [address] [place] [address] [place] [address] [phone]   Please follow-up on your plan to go to [facility] for treatment.  Vevelyn Francois, NP [address] [place] [address] [phone]  Call  as needed         Fatima Blank, MD 07/06/20 [phone]

## 2020-07-06 NOTE — ED Notes (Signed)
Called [facility] to pick up pt for d/c home

## 2020-07-15 ENCOUNTER — Emergency Department (HOSPITAL_COMMUNITY)
Admission: EM | Admit: 2020-07-15 | Discharge: 2020-07-16 | Disposition: A | Discharge status: Home / Self Care | Payer: Medicaid Other | Attending: Emergency Medicine | Admitting: Emergency Medicine

## 2020-07-15 ENCOUNTER — Other Ambulatory Visit: Payer: Self-pay

## 2020-07-15 DIAGNOSIS — F1990 Other psychoactive substance use, unspecified, uncomplicated: Secondary | ICD-10-CM | POA: Insufficient documentation

## 2020-07-15 DIAGNOSIS — Z79899 Other long term (current) drug therapy: Secondary | ICD-10-CM | POA: Insufficient documentation

## 2020-07-15 DIAGNOSIS — F1020 Alcohol dependence, uncomplicated: Secondary | ICD-10-CM

## 2020-07-15 DIAGNOSIS — F17210 Nicotine dependence, cigarettes, uncomplicated: Secondary | ICD-10-CM | POA: Insufficient documentation

## 2020-07-15 DIAGNOSIS — Y908 Blood alcohol level of 240 mg/100 ml or more: Secondary | ICD-10-CM | POA: Insufficient documentation

## 2020-07-15 DIAGNOSIS — F1010 Alcohol abuse, uncomplicated: Secondary | ICD-10-CM

## 2020-07-15 DIAGNOSIS — Z20822 Contact with and (suspected) exposure to covid-19: Secondary | ICD-10-CM | POA: Insufficient documentation

## 2020-07-15 LAB — COMPREHENSIVE METABOLIC PANEL
ALT: 209 U/L — ABNORMAL HIGH (ref 0–44)
AST: 230 U/L — ABNORMAL HIGH (ref 15–41)
Albumin: 4.5 g/dL (ref 3.5–5.0)
Alkaline Phosphatase: 51 U/L (ref 38–126)
Anion gap: 17 — ABNORMAL HIGH (ref 5–15)
BUN: 6 mg/dL (ref 6–20)
CO2: 25 mmol/L (ref 22–32)
Calcium: 9 mg/dL (ref 8.9–10.3)
Chloride: 101 mmol/L (ref 98–111)
Creatinine, Ser: 0.75 mg/dL (ref 0.61–1.24)
GFR, Estimated: >60 mL/min (ref >60)
Glucose, Bld: 95 mg/dL (ref 70–99)
Potassium: 3.5 mmol/L (ref 3.5–5.1)
Sodium: 143 mmol/L (ref 135–145)
Total Bilirubin: 0.7 mg/dL (ref 0.3–1.2)
Total Protein: 7.8 g/dL (ref 6.5–8.1)

## 2020-07-15 LAB — RAPID URINE DRUG SCREEN, HOSP PERFORMED
Amphetamines: NOT DETECTED
Barbiturates: NOT DETECTED
Benzodiazepines: POSITIVE — AB
Cocaine: NOT DETECTED
Opiates: NOT DETECTED
Tetrahydrocannabinol: POSITIVE — AB

## 2020-07-15 LAB — CBC WITH DIFFERENTIAL/PLATELET
Abs Immature Granulocytes: 0.02 10*3/uL (ref 0.00–0.07)
Basophils Absolute: 0.1 10*3/uL (ref 0.0–0.1)
Basophils Relative: 1 %
Eosinophils Absolute: 0.2 10*3/uL (ref 0.0–0.5)
Eosinophils Relative: 2 %
HCT: 49.2 % (ref 39.0–52.0)
Hemoglobin: 16.8 g/dL (ref 13.0–17.0)
Immature Granulocytes: 0 %
Lymphocytes Relative: 32 %
Lymphs Abs: 2.5 10*3/uL (ref 0.7–4.0)
MCH: 33.3 pg (ref 26.0–34.0)
MCHC: 34.1 g/dL (ref 30.0–36.0)
MCV: 97.4 fL (ref 80.0–100.0)
Monocytes Absolute: 0.5 10*3/uL (ref 0.1–1.0)
Monocytes Relative: 6 %
Neutro Abs: 4.6 10*3/uL (ref 1.7–7.7)
Neutrophils Relative %: 59 %
Platelets: 137 10*3/uL — ABNORMAL LOW (ref 150–400)
RBC: 5.05 MIL/uL (ref 4.22–5.81)
RDW: 15.7 % — ABNORMAL HIGH (ref 11.5–15.5)
WBC: 7.9 10*3/uL (ref 4.0–10.5)
nRBC: 0 % (ref 0.0–0.2)

## 2020-07-15 LAB — ETHANOL: Alcohol, Ethyl (B): 320 mg/dL (ref <10)

## 2020-07-15 LAB — RESP PANEL BY RT-PCR (FLU A&B, COVID) ARPGX2
Influenza A by PCR: NEGATIVE
Influenza B by PCR: NEGATIVE
SARS Coronavirus 2 by RT PCR: NEGATIVE

## 2020-07-15 MED ORDER — THIAMINE HCL 100 MG PO TABS
100.0000 mg | ORAL_TABLET | Freq: Every day | ORAL | Status: DC
  Administered 2020-07-15 – 2020-07-16 (×2): 100 mg via ORAL
  Filled 2020-07-15 (×2): qty 1

## 2020-07-15 MED ORDER — ONDANSETRON HCL 4 MG PO TABS
4.0000 mg | ORAL_TABLET | Freq: Three times a day (TID) | ORAL | Status: DC | PRN
  Administered 2020-07-15 (×2): 4 mg via ORAL
  Filled 2020-07-15 (×3): qty 1

## 2020-07-15 MED ORDER — IBUPROFEN 400 MG PO TABS
600.0000 mg | ORAL_TABLET | Freq: Three times a day (TID) | ORAL | Status: DC | PRN
  Administered 2020-07-15: 600 mg via ORAL
  Filled 2020-07-15: qty 1

## 2020-07-15 MED ORDER — LORAZEPAM 2 MG/ML IJ SOLN
2.0000 mg | Freq: Once | INTRAMUSCULAR | Status: AC
  Administered 2020-07-15: 2 mg via INTRAVENOUS
  Filled 2020-07-15: qty 1

## 2020-07-15 MED ORDER — SODIUM CHLORIDE 0.9 % IV BOLUS
1000.0000 mL | Freq: Once | INTRAVENOUS | Status: AC
  Administered 2020-07-15: 1000 mL via INTRAVENOUS

## 2020-07-15 MED ORDER — THIAMINE HCL 100 MG/ML IJ SOLN: 100.0000 mg | Freq: Every day | INTRAMUSCULAR | Status: DC

## 2020-07-15 MED ORDER — LORAZEPAM 1 MG PO TABS
0.0000 mg | ORAL_TABLET | Freq: Two times a day (BID) | ORAL | Status: DC
  Administered 2020-07-15: 2 mg via ORAL

## 2020-07-15 MED ORDER — LORAZEPAM 2 MG/ML IJ SOLN: 0.0000 mg | Freq: Four times a day (QID) | INTRAMUSCULAR | Status: DC

## 2020-07-15 MED ORDER — ALUM & MAG HYDROXIDE-SIMETH 200-200-20 MG/5ML PO SUSP: 30.0000 mL | Freq: Four times a day (QID) | ORAL | Status: DC | PRN

## 2020-07-15 MED ORDER — LORAZEPAM 1 MG PO TABS
0.0000 mg | ORAL_TABLET | Freq: Four times a day (QID) | ORAL | Status: DC
  Administered 2020-07-15 (×2): 2 mg via ORAL
  Filled 2020-07-15 (×4): qty 2

## 2020-07-15 MED ORDER — ONDANSETRON HCL 4 MG/2ML IJ SOLN
4.0000 mg | Freq: Once | INTRAMUSCULAR | Status: AC
  Administered 2020-07-15: 4 mg via INTRAVENOUS
  Filled 2020-07-15: qty 2

## 2020-07-15 MED ORDER — LORAZEPAM 2 MG/ML IJ SOLN: 0.0000 mg | Freq: Two times a day (BID) | INTRAMUSCULAR | Status: DC

## 2020-07-15 NOTE — ED Notes (Signed)
Given bag lunch with meds

## 2020-07-15 NOTE — ED Notes (Signed)
Patient has come to RN desk x 2 asking for ativan; pt walks to desk with no problem but appears with shakes and a bag of vomit; EDP notified due to high CIWA;

## 2020-07-15 NOTE — ED Notes (Signed)
1 bag to locker #3

## 2020-07-15 NOTE — ED Notes (Signed)
Ambulatory to nurses station. Steady gait. Given ice water per request.

## 2020-07-15 NOTE — ED Notes (Signed)
ED Provider at bedside. 

## 2020-07-15 NOTE — ED Triage Notes (Signed)
Pt arrives via EMS from home with c/o alcohol withdrawal. EMS reports pt was to [facility] detox today but they would not accept him d/t his being intoxicated. Pt reports last drink today @0900 . Pt reports history of seizures with withdrawal. Drinks approx. 1/2 gal tequila/ day for the last 3 months

## 2020-07-15 NOTE — ED Provider Notes (Signed)
[facility] EMERGENCY DEPARTMENT Provider Note   CSN: [id number] Arrival date & time: 07/15/20  1019     History Chief Complaint  Patient presents with   detox    Chad Morgan is a 40 y.o. male.  40 year old male presents with EMS with request for detox.  Patient states that he was previously given Librium however his girlfriend took all of it.  Patient is also distraught due to recent traumatic event with his girlfriend.  Patient states that his girlfriend overdosed on heroin and died, he had to resuscitate her, states that in doing so she killed their unborn child.  Patient reports this happening just a few days ago.  States he was left with a little bit of money as she has taken all of his 41 so he has not been able to drink his usual half gallon of liquor daily, did have 1 shot this morning.  Reports prior history of seizures with withdrawal and states he is feeling terrible today.  Denies suicidal or homicidal ideation.  Also reports sores to his arm, denies drug use, states that he got them from his girlfriend.  No other complaints or concerns today.      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   Seizure ([place]) 05/16/2017   Anemia 05/16/2017   MDD (major depressive disorder), recurrent severe, 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   Alcohol dependence with inpatient treatment ([place]) 03/31/2017   MDD (major depressive  disorder), severe ([place]) 03/19/2017   Reactive depression 06/06/2015   Suicidal ideation 06/05/2015   Severe episode of recurrent major depressive disorder, without psychotic features ([place])    Major depressive disorder, recurrent, severe without psychotic features ([place])    PTSD (post-traumatic stress disorder) 04/25/2014   Severe major depression without psychotic features ([place]) 04/24/2014   Tobacco use 04/16/2014   Social anxiety disorder 01/09/2014   Panic attacks 01/09/2014   GAD (generalized anxiety disorder) 01/09/2014   Alcohol dependence ([place]) 01/08/2014   Alcohol use disorder, severe, dependence ([place]) 01/08/2014    Past Surgical History:  Procedure Laterality Date   WISDOM TOOTH EXTRACTION         Family History  Problem Relation Age of Onset   Alcoholism Father     Social History   Tobacco Use   Smoking status: Some Days    Packs/day: 0.00    Years: 16.00    Pack years: 0.00    Types: Cigarettes   Smokeless tobacco: Never  Vaping Use   Vaping Use: Every day  Substance Use Topics   Alcohol use: Yes   Drug use: Yes    Types: Benzodiazepines, Marijuana    Comment: Patient denies     Home Medications Prior to Admission medications   Medication Sig Start Date End Date Taking? Authorizing Provider  chlordiazePOXIDE (LIBRIUM) 25 MG capsule 50mg  PO TID x 1D, then 25-50mg  PO BID X  1D, then 25-50mg  PO QD X 1D 07/06/20   Cardama, Grayce Sessions, MD  FLUoxetine (PROZAC) 20 MG capsule Take 1 capsule (20 mg total) by mouth 2 (two) times daily. For mood control 03/03/19 06/01/19  Vevelyn Francois, NP  folic acid (FOLVITE) 1 MG tablet Take 1 tablet (1 mg total) by mouth daily. Patient not taking: No sig reported 12/29/18   Thornell Mule, MD  gabapentin (NEURONTIN) 300 MG capsule Take 1 capsule (300 mg total) by mouth 5 (five) times daily. For alcohol withdrawal 03/03/19 06/01/19  Vevelyn Francois, NP  Multiple Vitamin (MULTIVITAMIN WITH MINERALS) TABS tablet Take 1 tablet by  mouth daily. Patient not taking: Reported on 07/06/2020 12/29/18   Thornell Mule, MD  pantoprazole (PROTONIX) 40 MG tablet Take 1 tablet (40 mg total) by mouth daily. Patient not taking: No sig reported 02/02/19   Vevelyn Francois, NP  propranolol (INDERAL) 10 MG tablet Take 1 tablet (10 mg total) by mouth 2 (two) times daily. 03/03/19 06/01/19  Vevelyn Francois, NP  thiamine 100 MG tablet Take 1 tablet (100 mg total) by mouth daily. 07/06/20   Fatima Blank, MD    Allergies    Patient has no known allergies.  Review of Systems   Review of Systems  Constitutional:  Negative for chills and fever.  Respiratory:  Negative for shortness of breath.   Cardiovascular:  Negative for chest pain.  Gastrointestinal:  Positive for nausea. Negative for abdominal pain, constipation, diarrhea and vomiting.  Genitourinary:  Negative for dysuria.  Musculoskeletal:  Negative for arthralgias and myalgias.  Skin:  Positive for rash.  Allergic/Immunologic: Negative for immunocompromised state.  Neurological:  Positive for tremors.  Hematological:  Negative for adenopathy.  Psychiatric/Behavioral:  Negative for confusion.   All other systems reviewed and are negative.  Physical Exam Updated Vital Signs BP (!) 108/57   Pulse (!) 101   Temp 98.2 F (36.8 C) (Oral)   Resp 15   Ht 6\' 2"  (1.88 m)   Wt 77 kg   SpO2 92%   BMI 21.80 kg/m   Physical Exam Vitals and nursing note reviewed.  Constitutional:      General: He is not in acute distress.    Appearance: He is well-developed. He is not diaphoretic.  HENT:     Head: Normocephalic and atraumatic.     Mouth/Throat:     Mouth: Mucous membranes are moist.  Eyes:     Pupils: Pupils are equal, round, and reactive to light.  Cardiovascular:     Rate and Rhythm: Normal rate and regular rhythm.     Pulses: Normal pulses.     Heart sounds: Normal heart sounds.  Pulmonary:     Effort: Pulmonary effort is normal.     Breath sounds: Normal breath  sounds.  Abdominal:     Palpations: Abdomen is soft.     Tenderness: There is no abdominal tenderness.  Skin:    General: Skin is warm and dry.     Findings: No erythema or rash.     Comments: 2-3 small lesions to bilateral forearms  Neurological:     Mental Status: He is alert and oriented to person, place, and time.  Psychiatric:        Behavior: Behavior normal.    ED Results / Procedures / Treatments   Labs (all labs ordered are listed, but only abnormal results are displayed) Labs Reviewed  COMPREHENSIVE METABOLIC PANEL - Abnormal; Notable for the following components:  Result Value   AST 230 (*)    ALT 209 (*)    Anion gap 17 (*)    All other components within normal limits  ETHANOL - Abnormal; Notable for the following components:   Alcohol, Ethyl (B) 320 (*)    All other components within normal limits  CBC WITH DIFFERENTIAL/PLATELET - Abnormal; Notable for the following components:   RDW 15.7 (*)    Platelets 137 (*)    All other components within normal limits  RESP PANEL BY RT-PCR (FLU A&B, COVID) ARPGX2  RAPID URINE DRUG SCREEN, HOSP PERFORMED    EKG None  Radiology No results found.  Procedures Procedures   Medications Ordered in ED Medications  LORazepam (ATIVAN) injection 0-4 mg ( Intravenous See Alternative 07/15/20 1713)    Or  LORazepam (ATIVAN) tablet 0-4 mg (2 mg Oral Given 07/15/20 1713)  LORazepam (ATIVAN) injection 0-4 mg ( Intravenous See Alternative 07/15/20 1519)    Or  LORazepam (ATIVAN) tablet 0-4 mg (2 mg Oral Given 07/15/20 1519)  thiamine tablet 100 mg (100 mg Oral Given 07/15/20 1159)    Or  thiamine (B-1) injection 100 mg ( Intravenous See Alternative 07/15/20 1159)  ondansetron (ZOFRAN) tablet 4 mg (4 mg Oral Given 07/15/20 1714)  ibuprofen (ADVIL) tablet 600 mg (600 mg Oral Given 07/15/20 1200)  alum & mag hydroxide-simeth (MAALOX/MYLANTA) 200-200-20 MG/5ML suspension 30 mL (has no administration in time range)    ED Course   I have reviewed the triage vital signs and the nursing notes.  Pertinent labs & imaging results that were available during my care of the patient were reviewed by me and considered in my medical decision making (see chart for details).  Clinical Course as of 07/15/20 1918  Mon Jul 15, 7260  6772 40 year old male presents with request for alcohol detox feeling like he is going into withdrawal with history of prior seizures. Patient was evaluated, found to have CBC without significant changes, CMP with elevated AST and ALT likely to his heavy alcohol use.  COVID-negative.  Patient has not provided a urine sample for UDS.  His alcohol level is elevated at 320 consistent with his alcohol use.  CIWA protocol ordered to monitor patient while awaiting behavioral health evaluation and disposition. [LM]    Clinical Course User Index [LM] Roque Lias   MDM Rules/Calculators/A&P                           Final Clinical Impression(s) / ED Diagnoses Final diagnoses:  None    Rx / DC Orders ED Discharge Orders     None        Roque Lias 07/15/20 1918    Carmin Muskrat, MD 07/16/20 1657

## 2020-07-15 NOTE — ED Notes (Signed)
Yellow socks and fall risk arm band placed. 

## 2020-07-15 NOTE — ED Notes (Signed)
C/o feels bad, really sick, r/t withdrawal, requesting ativan, wants help with detox, admits to "drinking a shot prior to coming to ED, just to get here", and admits to HA, body aches, nausea, vomiting PTA, diarrhea, stomach upset, anxiousness, sob, sob worse at night, wakes gasping, and insomnia. Hasn't slept in days.

## 2020-07-16 MED ORDER — CHLORDIAZEPOXIDE HCL 25 MG PO CAPS: ORAL_CAPSULE | ORAL | 0 refills | Status: DC

## 2020-07-16 NOTE — ED Notes (Signed)
Patient is sleeping and no tremors noted; RN will continue to monitor and [facility]

## 2020-07-16 NOTE — ED Notes (Signed)
Pt resting in bed with eyes closed. NAD noted, equal respirations noted. No tremors noted.

## 2020-07-16 NOTE — ED Notes (Signed)
Patient ambulated to desk with steady gait pushing IV pump with no issues; pt states he needs to be reevaluated for Ativan "it's been 6 hours". RN noted to patient that evaluation will occur when needed and RN did not note need for more ativan at this time; Pt showed slight disapprovement of RN's assessment but walked back to room with no gait issues; RN has looked in on patient several times without his knowledge and did not note the trembles that he appears to have when he is aware staff is looking at him; RN will continue to observe need for [name]

## 2020-07-16 NOTE — ED Notes (Signed)
TTS in process 

## 2020-07-16 NOTE — ED Notes (Signed)
Pt found to have 2 vapes along with apple watch on his person. Items removed from pts belongings and locked up in locker 3 with other belongings.

## 2020-07-16 NOTE — BH Assessment (Signed)
Comprehensive Clinical Assessment (CCA) Note  07/16/2020 Chad Morgan [id number]  Disposition:  Chad Reasoner, NP, patient is psych cleared. Patient will follow up with [facility] or [facility], as patient reported both places needed medical clearance prior to acceptance. Patient contracted for safety. Patient reported being picked up by [facility] tomorrow AM and then he is going to [place] or [place].   The patient demonstrates the following risk factors for suicide: Chronic risk factors for suicide include: substance use disorder. Acute risk factors for suicide include: unemployment and loss (financial, interpersonal, professional). Protective factors for this patient include: positive social support and hope for the future. Considering these factors, the overall suicide risk at this point appears to be moderate. Patient is appropriate for outpatient follow up.  [name] from 07/15/2020 in [name] from 07/05/2020 in [name] from 04/09/2018 in [place] No Risk No Risk No Risk      Chad Morgan is a 40 year old male presenting voluntary to Chad Morgan due to alcohol intoxication and withdrawals. Patient reported calling EMS. Patient reported needing medical clearance due to being in active withdrawals prior to going to [facility] or [place] for substance abuse treatment. Patient reported current withdrawals included seizures, shakiness and body aches. Patient reported to EDP of grief/loss of traumatic incident of girlfriend overdosing on heroin and having to resuscitate her a few days ago. Patient reported their unborn child died. Patient reported using half gallon of tequila every day. Patient reported drinking since the age of 17. Patient denied SI, HI and psychosis. Patient only reported worsening anxiety and no depression. Patient denied prior  psych hospitalizations, suicide attempts and self-harming behaviors. Patient currently resides alone. Patient is currently unemployed. Patient reported main support system is his grandmother. Patient contracted for safety. Patient was cooperative during assessment.   PER TRIAGE NOTE: Pt arrives via EMS from home with c/o alcohol withdrawal. EMS reports pt was to [facility] detox today but they would not accept him d/t his being intoxicated. Pt reports last drink today @0900 . Pt reports history of seizures with withdrawal. Drinks approx. 1/2 gal tequila/ day for the last 3 months  Chief Complaint:  Chief Complaint  Patient presents with   detox   Visit Diagnosis:  Alcohol depedence  CCA Biopsychosocial Patient Reported Schizophrenia/Schizoaffective Diagnosis in Past: No data recorded  Strengths: self-awareness  Mental Health Symptoms Depression:   Fatigue (increased anxiety)   Duration of Depressive symptoms:  Duration of Depressive Symptoms: Greater than two weeks   Mania:   None   Anxiety:    Restlessness; Tension   Psychosis:   None   Duration of Psychotic symptoms:    Trauma:   None   Obsessions:   None   Compulsions:   None   Inattention:   None   Hyperactivity/Impulsivity:   None   Oppositional/Defiant Behaviors:   None   Emotional Irregularity:   None   Other Mood/Personality Symptoms:  No data recorded   Mental Status Exam Appearance and self-care  Stature:   Average   Weight:   Average weight   Clothing:   Age-appropriate   Grooming:   Normal   Cosmetic use:   Age appropriate   Posture/gait:   Normal   Motor activity:   Not Remarkable   Sensorium  Attention:   Normal   Concentration:   Normal   Orientation:   X5  Recall/memory:   Normal   Affect and Mood  Affect:   Anxious   Mood:   Anxious   Relating  Eye contact:   Normal   Facial expression:   Anxious   Attitude toward examiner:   Cooperative    Thought and Language  Speech flow:  Clear and Coherent   Thought content:   Appropriate to Mood and Circumstances   Preoccupation:   None   Hallucinations:   None   Organization:  No data recorded  [employer] of Knowledge:   Average   Intelligence:   Average   Abstraction:   Normal   Judgement:   Impaired   Reality Testing:  No data recorded  Insight:   Fair   Decision Making:   Normal   Social Functioning  Social Maturity:  No data recorded  Social Judgement:  No data recorded  Stress  Stressors:   Other (Comment) (alcohol detox)   Coping Ability:   Overwhelmed; Exhausted   Skill Deficits:   Decision making   Supports:   Family    Religion:   Leisure/Recreation: Leisure / Recreation Do You Have Hobbies?: No  Exercise/Diet: Exercise/Diet Do You Exercise?: No Do You Follow a Special Diet?: No Do You Have Any Trouble Sleeping?: Yes Explanation of Sleeping Difficulties: "none"  CCA Employment/Education Employment/Work Situation: Employment / Work Situation Employment Situation: Unemployed Patient's Job has Been Impacted by Current Illness: No Has Patient ever Been in Passenger transport manager?: No  Education: Education Is Patient Currently Attending School?: No Last Grade Completed: 12 Did You Nutritional therapist?: No  CCA Family/Childhood History Family and Relationship History: Family history Does patient have children?: Yes How many children?: 3 How is patient's relationship with their children?: very good  Childhood History:  Childhood History By whom was/is the patient raised?: Mother Did patient suffer any verbal/emotional/physical/sexual abuse as a child?: No Did patient suffer from severe childhood neglect?: No Has patient ever been sexually abused/assaulted/raped as an adolescent or adult?: No Was the patient ever a victim of a crime or a disaster?: No Witnessed domestic violence?: No Has patient been affected by  domestic violence as an adult?: No  Child/Adolescent Assessment:   CCA Substance Use Alcohol/Drug Use: Alcohol / Drug Use Pain Medications: see MAR Prescriptions: see MAR Over the Counter: see MAR History of alcohol / drug use?: Yes Substance #1 Name of Substance 1: alcohol 1 - Age of First Use: 17 1 - Amount (size/oz): half gallon of tequila 1 - Frequency: daily 1 - Last Use / Amount: yesterday 1- Route of Use: oral   ASAM's:  Six Dimensions of Multidimensional Assessment  Dimension 1:  Acute Intoxication and/or Withdrawal Potential:      Dimension 2:  Biomedical Conditions and Complications:      Dimension 3:  Emotional, Behavioral, or Cognitive Conditions and Complications:     Dimension 4:  Readiness to Change:     Dimension 5:  Relapse, Continued use, or Continued Problem Potential:     Dimension 6:  Recovery/Living Environment:     ASAM Severity Score:    ASAM Recommended Level of Treatment:     Substance use Disorder (SUD)   Recommendations for Morgan/Supports/Treatments:   Discharge Disposition:   DSM5 Diagnoses: Patient Active Problem List   Diagnosis Date Noted   Vitamin D insufficiency 02/08/2019   Alcohol use 02/02/2019   Brain tumor ([place]) 02/02/2019   Alcohol withdrawal syndrome ([place]) 12/27/2018   Hypokalemia    Hyponatremia  Transaminitis    Hyperglycemia    Major depressive disorder, recurrent episode, moderate ([place]) 09/27/2018   Alcohol withdrawal ([place]) 07/10/2018   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   Seizure ([place]) 05/16/2017   Anemia 05/16/2017   MDD (major depressive disorder), recurrent severe, without psychosis ([place]) 05/10/2017   Alcohol dependence with inpatient treatment ([place]) 03/31/2017   MDD (major depressive disorder), severe ([place]) 03/19/2017   Reactive depression 06/06/2015   Suicidal ideation 06/05/2015   Severe episode of recurrent major depressive disorder, without psychotic features ([place])    Major depressive  disorder, recurrent, severe without psychotic features ([place])    PTSD (post-traumatic stress disorder) 04/25/2014   Severe major depression without psychotic features ([place]) 04/24/2014   Tobacco use 04/16/2014   Social anxiety disorder 01/09/2014   Panic attacks 01/09/2014   GAD (generalized anxiety disorder) 01/09/2014   Alcohol dependence ([place]) 01/08/2014   Alcohol use disorder, severe, dependence ([place]) 01/08/2014   Referrals to Alternative Service(s): Referred to Alternative Service(s):   Place:   Date:   Time:    Referred to Alternative Service(s):   Place:   Date:   Time:    Referred to Alternative Service(s):   Place:   Date:   Time:    Referred to Alternative Service(s):   Place:   Date:   Time:     Venora Maples, [facility]

## 2020-07-16 NOTE — Discharge Instructions (Addendum)
It was our pleasure to provide your ER care today - we hope that you feel better.  Follow up with AA and use resource guide provided for additional treatment programs.   You may take librium as prescribed, as need, to help with withdrawal symptoms - no driving when taking, or any time when drinking alcohol.  Return to ER if worse, new symptoms, fevers, chest pain, trouble breathing, or other concern.

## 2020-07-16 NOTE — ED Provider Notes (Signed)
Emergency Medicine Observation Re-evaluation Note  Chad Morgan is a 40 y.o. male, seen on rounds today.  Pt initially presented to the ED for complaints of detox Currently, the patient is eating/drinking, no distress. No tremor or shakes. Vitals normal.   Physical Exam  BP 110/62   Pulse 66   Temp 98 F (36.7 C) (Oral)   Resp 17   Ht 1.88 m (6\' 2" )   Wt 77 kg   SpO2 98%   BMI 21.80 kg/m  Physical Exam General: alert, content, nad Cardiac: regular rate Lungs: breathing comfortably Psych: alert, content. Normal mood/affect. No tremor or shakes.  No SI.   ED Course / MDM  EKG:   I have reviewed the labs performed to date as well as medications administered while in observation.  Recent changes in the last 24 hours include stabilization and reassessment.  Plan  Current plan is for pursue outpatient rehab.   Pt requests rx librium, and states feels ready for d/c.      Lajean Saver, MD 07/16/20 1212

## 2020-07-22 ENCOUNTER — Other Ambulatory Visit (HOSPITAL_COMMUNITY): Payer: Self-pay

## 2020-08-05 DEATH — deceased

## 2021-09-10 IMAGING — DX DG LUMBAR SPINE COMPLETE W/ BEND
7 series · 7 of 7 positions shown · non-contrast
Comparison: None.

CLINICAL DATA: Low back pain

EXAM:
LUMBAR SPINE - COMPLETE WITH BENDING VIEWS

[l-spine ap]
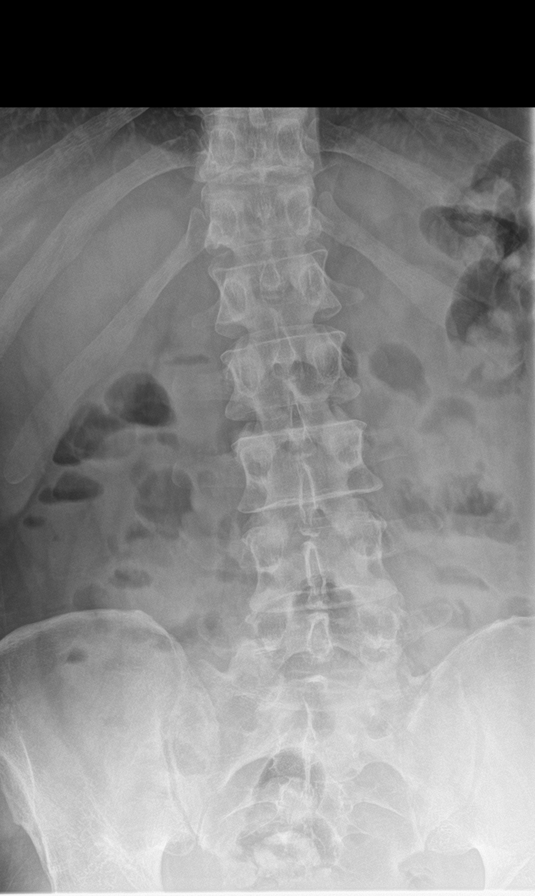

[l-spine obl (1 of 2)]
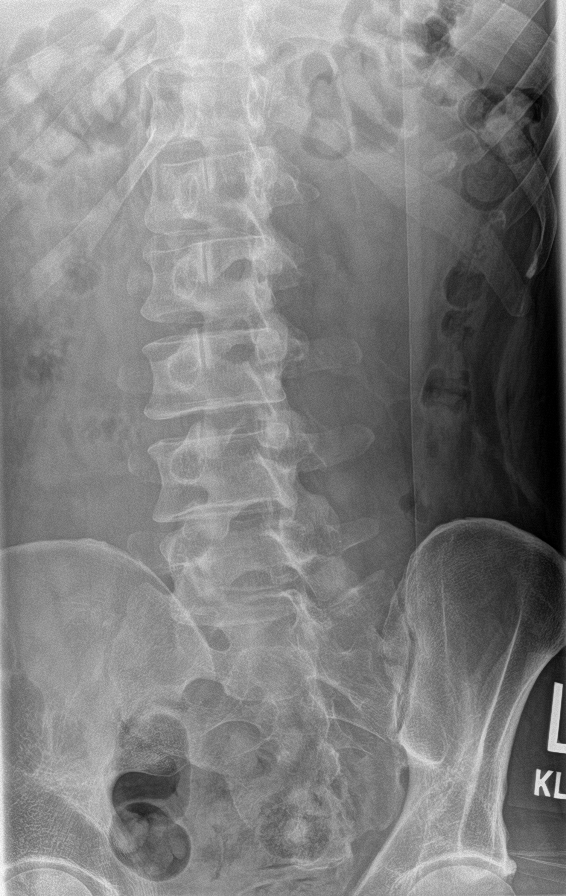

[l-spine obl (2 of 2)]
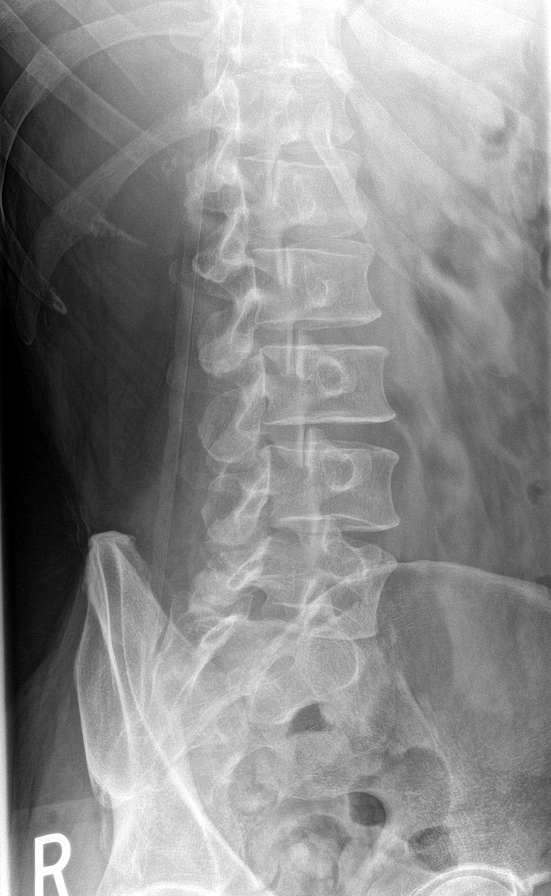

[l-spine lat]
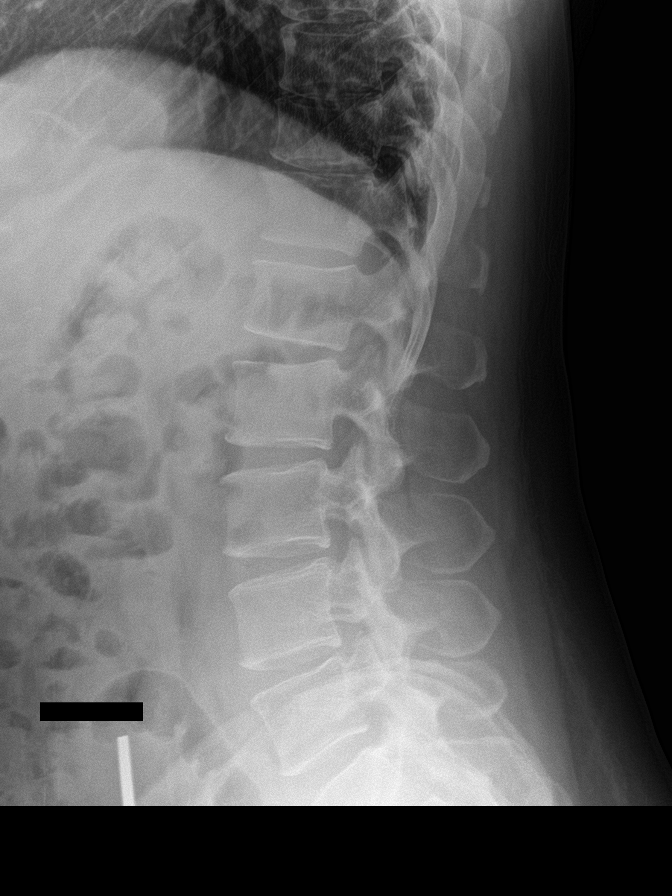

[l-spine ext]
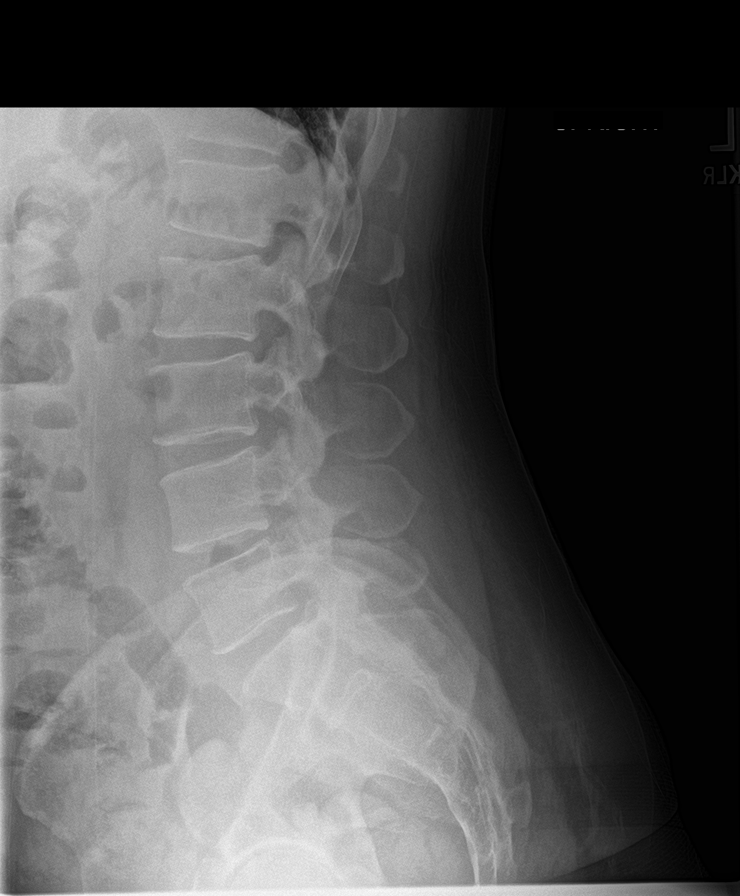

[l-spine flex]
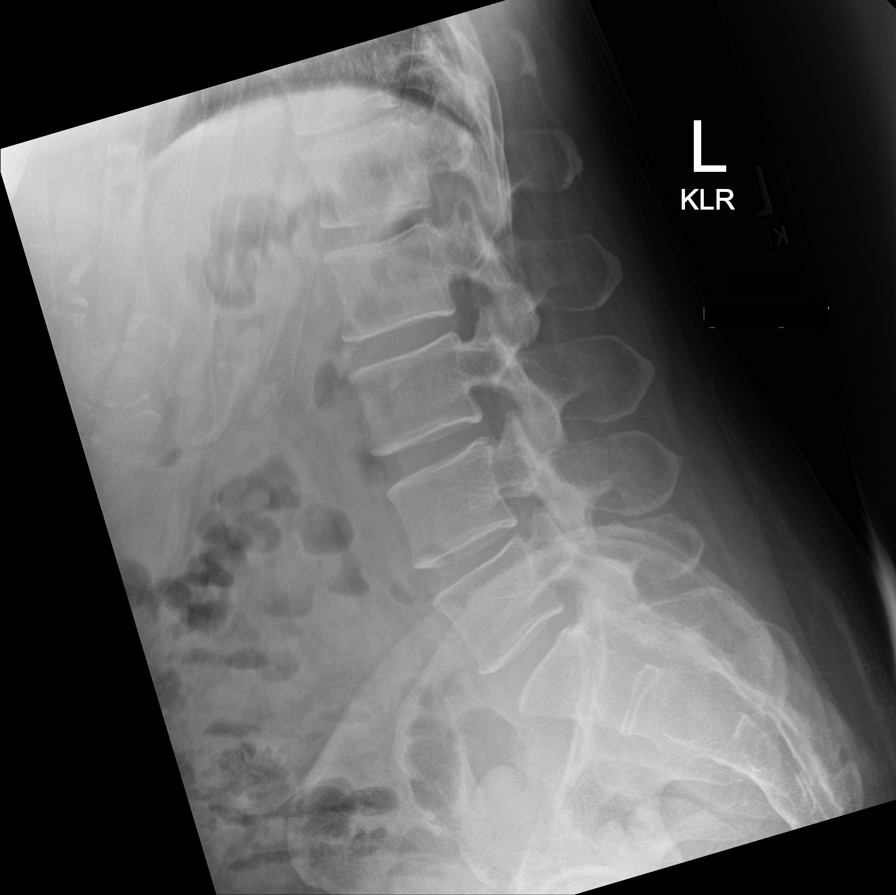

[l-spine spot]
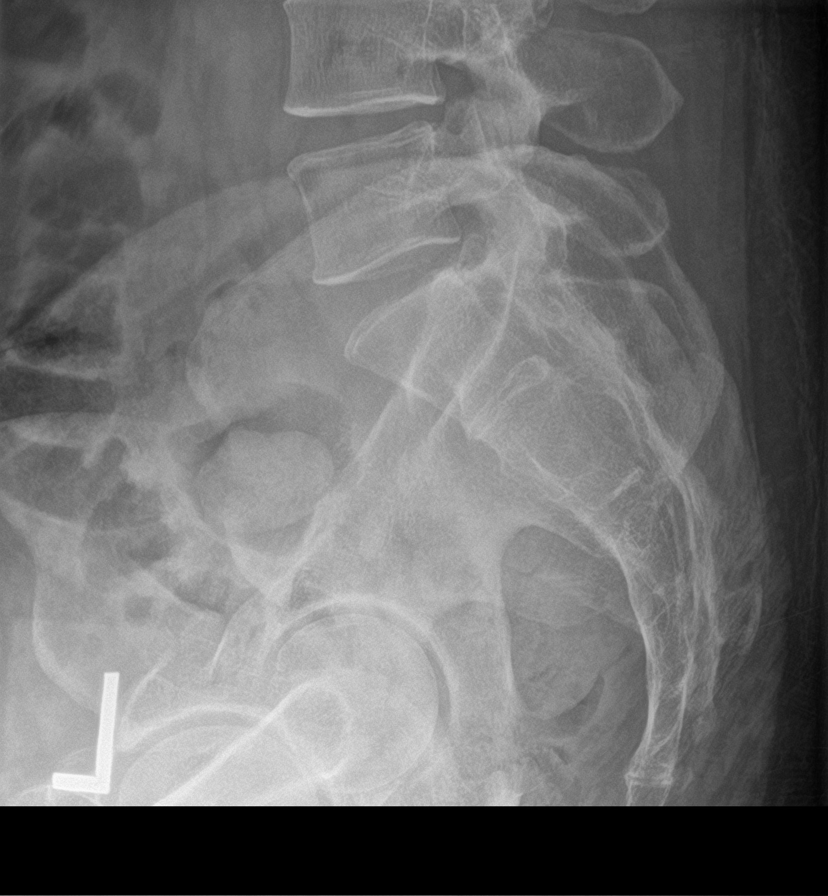

[7 of 7 positions shown; findings below may reference images not displayed]

FINDINGS: Upright frontal, upright neutral lateral, upright flexion lateral,
upright extension lateral, neutral lateral, and bilateral oblique
views were obtained. There are 5 non-rib-bearing lumbar type
vertebral bodies. There is lower lumbar levoscoliosis. There is no
appreciable fracture. There is no spondylolisthesis on neutral
lateral imaging. There is no appreciable change in lateral alignment
between neutral lateral, flexion lateral, and extension lateral
imaging. There is mild disc space narrowing at L4-5. other disc
spaces appear unremarkable. There is no appreciable facet
arthropathy.
IMPRESSION: Scoliosis. No fracture. No spondylolisthesis. No change in lateral
alignment between neutral lateral, flexion lateral, and extension
lateral imaging. Mild disc space narrowing at L4-5. Other disc
spaces appear normal. No appreciable facet arthropathy.
